# Patient Record
Sex: Female | Born: 1945 | Race: White | Hispanic: No | Marital: Single | Smoking: Current every day smoker
Health system: Southern US, Community
[De-identification: ages and names within clinical notes are randomized; demographics above are authoritative.]

## PROBLEM LIST (undated history)

## (undated) DIAGNOSIS — I2510 Atherosclerotic heart disease of native coronary artery without angina pectoris: Secondary | ICD-10-CM

## (undated) DIAGNOSIS — E785 Hyperlipidemia, unspecified: Secondary | ICD-10-CM

## (undated) DIAGNOSIS — I10 Essential (primary) hypertension: Secondary | ICD-10-CM

## (undated) DIAGNOSIS — I700 Atherosclerosis of aorta: Secondary | ICD-10-CM

## (undated) DIAGNOSIS — K219 Gastro-esophageal reflux disease without esophagitis: Secondary | ICD-10-CM

## (undated) DIAGNOSIS — I739 Peripheral vascular disease, unspecified: Secondary | ICD-10-CM

## (undated) DIAGNOSIS — I6529 Occlusion and stenosis of unspecified carotid artery: Secondary | ICD-10-CM

## (undated) DIAGNOSIS — M810 Age-related osteoporosis without current pathological fracture: Secondary | ICD-10-CM

## (undated) DIAGNOSIS — E119 Type 2 diabetes mellitus without complications: Secondary | ICD-10-CM

## (undated) DIAGNOSIS — F329 Major depressive disorder, single episode, unspecified: Secondary | ICD-10-CM

## (undated) DIAGNOSIS — F419 Anxiety disorder, unspecified: Secondary | ICD-10-CM

## (undated) HISTORY — DX: Major depressive disorder, single episode, unspecified: F32.9

## (undated) HISTORY — DX: Hyperlipidemia, unspecified: E78.5

## (undated) HISTORY — DX: Occlusion and stenosis of unspecified carotid artery: I65.29

## (undated) HISTORY — PX: ABDOMINAL HYSTERECTOMY: SHX81

## (undated) HISTORY — DX: Atherosclerosis of aorta: I70.0

## (undated) HISTORY — DX: Atherosclerotic heart disease of native coronary artery without angina pectoris: I25.10

## (undated) HISTORY — DX: Age-related osteoporosis without current pathological fracture: M81.0

## (undated) HISTORY — DX: Gastro-esophageal reflux disease without esophagitis: K21.9

## (undated) HISTORY — DX: Peripheral vascular disease, unspecified: I73.9

---

## 1998-04-25 ENCOUNTER — Inpatient Hospital Stay (HOSPITAL_COMMUNITY): Admission: EM | Admit: 1998-04-25 | Discharge: 1998-04-29 | Payer: Self-pay | Admitting: Emergency Medicine

## 2010-01-25 HISTORY — PX: KNEE SURGERY: SHX244

## 2012-12-15 ENCOUNTER — Emergency Department (HOSPITAL_COMMUNITY)
Admission: EM | Admit: 2012-12-15 | Discharge: 2012-12-15 | Disposition: A | Discharge status: Home / Self Care | Payer: BC Managed Care – PPO | Attending: Emergency Medicine | Admitting: Emergency Medicine

## 2012-12-15 ENCOUNTER — Encounter (HOSPITAL_COMMUNITY): Payer: Self-pay | Admitting: Emergency Medicine

## 2012-12-15 ENCOUNTER — Emergency Department (HOSPITAL_COMMUNITY): Payer: BC Managed Care – PPO

## 2012-12-15 DIAGNOSIS — F17200 Nicotine dependence, unspecified, uncomplicated: Secondary | ICD-10-CM | POA: Insufficient documentation

## 2012-12-15 DIAGNOSIS — Z794 Long term (current) use of insulin: Secondary | ICD-10-CM | POA: Insufficient documentation

## 2012-12-15 DIAGNOSIS — R079 Chest pain, unspecified: Secondary | ICD-10-CM

## 2012-12-15 DIAGNOSIS — Z880 Allergy status to penicillin: Secondary | ICD-10-CM | POA: Diagnosis not present

## 2012-12-15 DIAGNOSIS — I10 Essential (primary) hypertension: Secondary | ICD-10-CM | POA: Diagnosis not present

## 2012-12-15 DIAGNOSIS — F411 Generalized anxiety disorder: Secondary | ICD-10-CM | POA: Diagnosis not present

## 2012-12-15 DIAGNOSIS — E119 Type 2 diabetes mellitus without complications: Secondary | ICD-10-CM | POA: Insufficient documentation

## 2012-12-15 DIAGNOSIS — R0789 Other chest pain: Secondary | ICD-10-CM | POA: Insufficient documentation

## 2012-12-15 DIAGNOSIS — Z79899 Other long term (current) drug therapy: Secondary | ICD-10-CM | POA: Diagnosis not present

## 2012-12-15 DIAGNOSIS — R739 Hyperglycemia, unspecified: Secondary | ICD-10-CM

## 2012-12-15 HISTORY — DX: Essential (primary) hypertension: I10

## 2012-12-15 HISTORY — DX: Anxiety disorder, unspecified: F41.9

## 2012-12-15 HISTORY — DX: Type 2 diabetes mellitus without complications: E11.9

## 2012-12-15 LAB — CBC
Hemoglobin: 15.9 g/dL — ABNORMAL HIGH (ref 12.0–15.0)
MCH: 34.1 pg — ABNORMAL HIGH (ref 26.0–34.0)
MCHC: 36.1 g/dL — ABNORMAL HIGH (ref 30.0–36.0)
RBC: 4.66 MIL/uL (ref 3.87–5.11)
RDW: 11.9 % (ref 11.5–15.5)

## 2012-12-15 LAB — POCT I-STAT TROPONIN I: Troponin i, poc: 0.01 ng/mL (ref 0.00–0.08)

## 2012-12-15 LAB — BASIC METABOLIC PANEL
BUN: 12 mg/dL (ref 6–23)
Calcium: 9.7 mg/dL (ref 8.4–10.5)
GFR calc Af Amer: >90 mL/min (ref >90)
GFR calc non Af Amer: 87 mL/min — ABNORMAL LOW (ref >90)
Glucose, Bld: 460 mg/dL — ABNORMAL HIGH (ref 70–99)
Potassium: 4.2 mEq/L (ref 3.5–5.1)
Sodium: 131 mEq/L — ABNORMAL LOW (ref 135–145)

## 2012-12-15 MED ORDER — NITROGLYCERIN 0.4 MG SL SUBL
0.4000 mg | SUBLINGUAL_TABLET | SUBLINGUAL | Status: DC | PRN
  Administered 2012-12-15 (×2): 0.4 mg via SUBLINGUAL
  Filled 2012-12-15: qty 25

## 2012-12-15 MED ORDER — INSULIN ASPART 100 UNIT/ML ~~LOC~~ SOLN: 10.0000 [IU] | Freq: Once | SUBCUTANEOUS | Status: DC

## 2012-12-15 MED ORDER — SODIUM CHLORIDE 0.9 % IV BOLUS (SEPSIS)
1000.0000 mL | Freq: Once | INTRAVENOUS | Status: AC
  Administered 2012-12-15: 1000 mL via INTRAVENOUS

## 2012-12-15 MED ORDER — INSULIN ASPART 100 UNIT/ML ~~LOC~~ SOLN
5.0000 [IU] | Freq: Once | SUBCUTANEOUS | Status: AC
  Administered 2012-12-15: 5 [IU] via INTRAVENOUS
  Filled 2012-12-15 (×2): qty 1

## 2012-12-15 MED ORDER — LIDOCAINE VISCOUS 2 % MT SOLN
15.0000 mL | Freq: Once | OROMUCOSAL | Status: AC
  Administered 2012-12-15: 15 mL via OROMUCOSAL
  Filled 2012-12-15: qty 15

## 2012-12-15 MED ORDER — ASPIRIN 325 MG PO TABS
325.0000 mg | ORAL_TABLET | Freq: Once | ORAL | Status: AC
  Administered 2012-12-15: 325 mg via ORAL
  Filled 2012-12-15: qty 1

## 2012-12-15 MED ORDER — ALUM & MAG HYDROXIDE-SIMETH 200-200-20 MG/5ML PO SUSP
30.0000 mL | Freq: Once | ORAL | Status: AC
  Administered 2012-12-15: 30 mL via ORAL
  Filled 2012-12-15: qty 30

## 2012-12-15 NOTE — ED Notes (Signed)
Per pt sts since Wednesday pain in the middle of her chest and her back. sts some nausea. sts recent death in the family anf very stressed. sts hx of anxiety and sts she hopes it is that. sts some SOB when the pain is there and it feels better when she lies down and rest.

## 2012-12-15 NOTE — ED Provider Notes (Signed)
Patient seen/examined in the Emergency Department in conjunction with Resident Physician Provider Thuet Patient reports chest pain, worse with swallowing/eating Exam : awake/alert, no distress,maex4, no murmurs noted Plan: suspect GI cause of CP.  She reports she will be able to f/u next week with PCP, I doubt ACS/PE/Dissection at this time    Joya Gaskins, MD 12/15/12 [phone]

## 2012-12-15 NOTE — ED Notes (Signed)
EKG completed given to EDP.  

## 2012-12-15 NOTE — ED Provider Notes (Signed)
Date: 12/15/2012  Rate: 124  Rhythm: sinus tachycardia  QRS Axis: normal  Intervals: normal  ST/T Wave abnormalities: nonspecific ST changes  Conduction Disutrbances:none  Narrative Interpretation:   Old EKG Reviewed: none available    Joya Gaskins, MD 12/15/12 [phone]

## 2012-12-15 NOTE — ED Provider Notes (Signed)
CSN: [id number]     Arrival date & time 12/15/12  1143 History   First MD Initiated Contact with Patient 12/15/12 1512     Chief Complaint  Patient presents with  . Chest Pain  . Anxiety   (Consider location/radiation/quality/duration/timing/severity/associated sxs/prior Treatment) HPI Sarah Francis is a 67 y.o. female who presents to the emergency department with chest pain and anxiety.  Patient reports that over the last month she has felt a pain in her central chest.  Constant.  Moderate in severity. Worse with swallowing.  Better with nothing.  Nonexertional.  Associated with foul taste in mouth.  No SOB.  Pain not worse with deep breathing or coughing.  No cough.  Not described as ripping or tearing.  No leg swelling.  No other symptoms.  Past Medical History  Diagnosis Date  . Anxiety   . Diabetes mellitus without complication   . Hypertension    Past Surgical History  Procedure Laterality Date  . Abdominal hysterectomy     History reviewed. No pertinent family history. History  Substance Use Topics  . Smoking status: Current Every Day Smoker  . Smokeless tobacco: Not on file  . Alcohol Use: Yes   OB History   Grav Para Term Preterm Abortions TAB SAB Ect Mult Living                 Review of Systems  Constitutional: Negative for fever and chills.  HENT: Negative for congestion and rhinorrhea.   Respiratory: Negative for cough and shortness of breath.   Cardiovascular: Positive for chest pain.  Gastrointestinal: Negative for nausea, vomiting, abdominal pain, diarrhea and abdominal distention.  Endocrine: Negative for polyuria.  Genitourinary: Negative for dysuria.  Musculoskeletal: Negative for neck pain and neck stiffness.  Skin: Negative for rash.  Neurological: Negative for headaches.  Psychiatric/Behavioral: Negative.   All other systems reviewed and are negative.    Allergies  Penicillins  Home Medications   Current Outpatient Rx  Name  Route  Sig   Dispense  Refill  . DULoxetine (CYMBALTA) 60 MG capsule   Oral   Take 60 mg by mouth daily.         . insulin aspart (NOVOLOG FLEXPEN) 100 UNIT/ML SOPN FlexPen   Subcutaneous   Inject 8-12 Units into the skin 3 (three) times daily with meals. Take 8 units in the morning 10 units before lunch and 12 units before dinner         . Insulin Glargine (LANTUS SOLOSTAR) 100 UNIT/ML SOPN   Subcutaneous   Inject 32 Units into the skin at bedtime.         Marland Kitchen lisinopril (PRINIVIL,ZESTRIL) 2.5 MG tablet   Oral   Take 2.5 mg by mouth daily.         . Pseudoephedrine-Ibuprofen (ADVIL COLD & SINUS LIQUI-GELS PO)   Oral   Take 1 capsule by mouth 2 (two) times daily as needed (for cold).         . ranitidine (ZANTAC) 150 MG tablet   Oral   Take 150 mg by mouth 2 (two) times daily as needed for heartburn.         . rosuvastatin (CRESTOR) 40 MG tablet   Oral   Take 10 mg by mouth 3 (three) times a week.         . Vitamin D, Ergocalciferol, (DRISDOL) 50000 UNITS CAPS capsule   Oral   Take 50,000 Units by mouth every 7 (seven) days.  BP 180/95  Pulse 111  Temp(Src) 98.1 F (36.7 C)  Resp 18  Ht 5\' 1"  (1.549 m)  Wt 140 lb (63.504 kg)  BMI 26.47 kg/m2  SpO2 97% Physical Exam  Nursing note and vitals reviewed. Constitutional: She is oriented to person, place, and time. She appears well-developed and well-nourished. No distress.  HENT:  Head: Normocephalic and atraumatic.  Right Ear: External ear normal.  Left Ear: External ear normal.  Nose: Nose normal.  Mouth/Throat: Oropharynx is clear and moist. No oropharyngeal exudate.  Eyes: EOM are normal. Pupils are equal, round, and reactive to light.  Neck: Normal range of motion. Neck supple. No tracheal deviation present.  Cardiovascular: Normal rate.   Pulmonary/Chest: Effort normal and breath sounds normal. No stridor. No respiratory distress. She has no wheezes. She has no rales.  Abdominal: Soft. She exhibits no  distension. There is no tenderness. There is no rebound.  Musculoskeletal: Normal range of motion.  Neurological: She is alert and oriented to person, place, and time.  Skin: Skin is warm and dry. She is not diaphoretic.    ED Course  Procedures (including critical care time) Labs Review Labs Reviewed  CBC - Abnormal; Notable for the following:    Hemoglobin 15.9 (*)    MCH 34.1 (*)    MCHC 36.1 (*)    All other components within normal limits  BASIC METABOLIC PANEL - Abnormal; Notable for the following:    Sodium 131 (*)    Chloride 92 (*)    Glucose, Bld 460 (*)    GFR calc non Af Amer 87 (*)    All other components within normal limits  GLUCOSE, CAPILLARY - Abnormal; Notable for the following:    Glucose-Capillary 201 (*)    All other components within normal limits  POCT I-STAT TROPONIN I  POCT I-STAT TROPONIN I   Imaging Review Dg Chest 2 View  12/15/2012   CLINICAL DATA:  Chest pain.  EXAM: CHEST  2 VIEW  COMPARISON:  None.  FINDINGS: Mediastinum and hilar structures are normal. Mild atelectasis versus infiltrate present in the lingula. No pleural effusion or pneumothorax. Heart size normal. Pulmonary vascularity normal. Prominent rounded calcific density right upper quadrant most likely a large gallstone. Ultrasound can be obtained for further evaluation is clinically indicated. No acute osseous abnormality.  IMPRESSION: 1. Mild lingular atelectasis and/or infiltrate. 2. Large calcific density right upper quadrant, most likely gallstone. Ultrasound can be obtained for further evaluation as clinically indicated.   Electronically Signed   By: Maisie Fus  Register   On: 12/15/2012 13:07    EKG Interpretation   None       MDM   1. Chest pain   2. Hyperglycemia      Sarah Francis is a 67 y.o. female who presents to the emergency with atypical chest pain.  Sounds to be very GI in nature.  However, patient has history of HTN, HLD, and DM.  Because of this she is not low risk.   Discussed case with patient regarding admission for formal cardiac rule out and discharge home with outpatient f/u and stress test.  Patient reports that she desires discharge and will f/u with her PCP in less than 72 hours.  Patient knows that she is always welcome back for further workup here if she changes her mind or if she develops new or worsening symptoms.  Pain very atypical description for PE, dissection, or occult ptx.  Doubt pneumonia.  As for hyperglycemia, patient reports that  she had not been able to take her insulin today.  No DKA on labs.  5 IV insulin and 1L NS administered with resolution of hyperglycemia to 201.  Patient safe for discharge home and is chest pain free after GI cocktail.  Patient discharged.  Recommend f/u with PCP.    Arloa Koh, MD 12/15/12 [phone]

## 2012-12-17 NOTE — ED Provider Notes (Signed)
I have personally seen and examined the patient.  I have discussed the plan of care with the resident.  I have reviewed the documentation on PMH/FH/Soc. History.  I have reviewed the documentation of the resident and agree.   Joya Gaskins, MD 12/17/12 [phone]

## 2012-12-22 ENCOUNTER — Encounter: Payer: Self-pay | Admitting: General Surgery

## 2012-12-22 DIAGNOSIS — M810 Age-related osteoporosis without current pathological fracture: Secondary | ICD-10-CM

## 2012-12-22 DIAGNOSIS — G479 Sleep disorder, unspecified: Secondary | ICD-10-CM

## 2012-12-22 DIAGNOSIS — E1149 Type 2 diabetes mellitus with other diabetic neurological complication: Secondary | ICD-10-CM

## 2012-12-22 DIAGNOSIS — E559 Vitamin D deficiency, unspecified: Secondary | ICD-10-CM

## 2012-12-22 DIAGNOSIS — F1020 Alcohol dependence, uncomplicated: Secondary | ICD-10-CM

## 2012-12-22 DIAGNOSIS — E782 Mixed hyperlipidemia: Secondary | ICD-10-CM

## 2012-12-22 DIAGNOSIS — F17200 Nicotine dependence, unspecified, uncomplicated: Secondary | ICD-10-CM

## 2012-12-22 DIAGNOSIS — G2581 Restless legs syndrome: Secondary | ICD-10-CM

## 2012-12-22 DIAGNOSIS — E1142 Type 2 diabetes mellitus with diabetic polyneuropathy: Secondary | ICD-10-CM

## 2012-12-22 DIAGNOSIS — F419 Anxiety disorder, unspecified: Secondary | ICD-10-CM

## 2012-12-22 DIAGNOSIS — M722 Plantar fascial fibromatosis: Secondary | ICD-10-CM

## 2012-12-25 ENCOUNTER — Ambulatory Visit (INDEPENDENT_AMBULATORY_CARE_PROVIDER_SITE_OTHER): Payer: BC Managed Care – PPO | Admitting: Cardiology

## 2012-12-25 ENCOUNTER — Encounter: Payer: Self-pay | Admitting: General Surgery

## 2012-12-25 ENCOUNTER — Encounter: Payer: Self-pay | Admitting: Cardiology

## 2012-12-25 VITALS — BP 152/86 | HR 92 | Ht 61.0 in | Wt 139.0 lb

## 2012-12-25 DIAGNOSIS — I10 Essential (primary) hypertension: Secondary | ICD-10-CM | POA: Insufficient documentation

## 2012-12-25 DIAGNOSIS — R079 Chest pain, unspecified: Secondary | ICD-10-CM | POA: Insufficient documentation

## 2012-12-25 NOTE — Patient Instructions (Signed)
Your physician recommends that you continue on your current medications as directed. Please refer to the Current Medication list given to you today.  Your physician has requested that you have an exercise stress myoview. For further information please visit [email or web address] Please follow instruction sheet, as given.  Your physician recommends that you schedule a follow-up appointment in: PRN if Symptoms worsen or fail to improve

## 2012-12-25 NOTE — Progress Notes (Signed)
[address] [place], [place]  [address] Phone: [phone] Fax:  [phone]  Date:  12/25/2012   ID:  Sarah Francis, DOB 12-25-1945, MRN [id number]  PCP:  Gweneth Dimitri, MD  Cardiologist:  Armanda Magic, MD    History of Present Illness: Sarah Francis is a 67 y.o. female with a history of DM who awakened with Chest pain on Friday.  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  She saw the nurse at her work and her BP was 190/135mmHg.  Her BS was 500.  She was sent to the ER and cardiac eval in ER was fine and she did not want to be admitted and was sent home with referral to Cardiology.  Since then she has had intermittent CP daily but is situational and occurs more when she is overwhelmed or stressed.  When she goes to eat dinner it hurts when she swallows food in the same midsternal area of her chest.  She is very anxious.  She denies any SOB.  She denies any diaphoresis or nausea with the CP.  Belching relieves the pain and zantac also helps.  She freqently wakes up in the middle of the night with a sour taste in her mouth and chest pressure and if she drinks water and belches it resolves.   Wt Readings from Last 3 Encounters:  12/25/12 139 lb (63.05 kg)  12/22/12 138 lb 12.8 oz (62.959 kg)  12/15/12 140 lb (63.504 kg)     Past Medical History  Diagnosis Date  . Anxiety   . Diabetes mellitus without complication   . Hypertension   . Major depression     initial diagnosis/medication around age 50  . Osteoporosis     w h/o sacral fracture    Current Outpatient Prescriptions  Medication Sig Dispense Refill  . Calcium Carbonate-Vit D-Min (CALCIUM 1200 PO) Take 1 tablet by mouth daily.      . DULoxetine (CYMBALTA) 60 MG capsule Take 60 mg by mouth daily.      . insulin aspart (NOVOLOG FLEXPEN) 100 UNIT/ML SOPN FlexPen Inject 8-12 Units into the skin 3 (three) times daily with meals. Take 8  units in the morning 10 units before lunch and 12 units before dinner      . Insulin Glargine (LANTUS SOLOSTAR) 100 UNIT/ML SOPN Inject 32 Units into the skin at bedtime.      Marland Kitchen lisinopril (PRINIVIL,ZESTRIL) 2.5 MG tablet Take 2.5 mg by mouth daily.      . Magnesium 250 MG TABS Take 1 tablet by mouth. PCP recommends taking 250-500 Mg 2 hours prior to bedtime is can help with restless leg syndrome as well as improve sleep.      . Melatonin 3 MG TABS Take 1 tablet by mouth. PCP recommends taking 3-6 MG about 2 hrs before bedtime      . Pseudoephedrine-Ibuprofen (ADVIL COLD & SINUS LIQUI-GELS PO) Take 1 capsule by mouth 2 (two) times daily as needed (for cold).      . ranitidine (ZANTAC) 150 MG tablet Take 150 mg by mouth 2 (two) times daily as needed for heartburn.      . rosuvastatin (CRESTOR) 40 MG tablet Take 10 mg by mouth 3 (three) times a week.      . Vitamin D, Ergocalciferol, (DRISDOL) 50000 UNITS CAPS capsule Take 50,000 Units by mouth every 7 (seven) days.  No current facility-administered medications for this visit.    Allergies:    Allergies  Allergen Reactions  . Penicillins Rash    Social History:  The patient  reports that she has been smoking.  She has never used smokeless tobacco. She reports that she drinks alcohol. She reports that she does not use illicit drugs.   Family History:  The patient's family history is not on file.   ROS:  Please see the history of present illness.      All other systems reviewed and negative.   PHYSICAL EXAM: VS:  BP 152/86  Pulse 92  Ht 5\' 1"  (1.549 m)  Wt 139 lb (63.05 kg)  BMI 26.28 kg/m2 Well nourished, well developed, in no acute distress HEENT: normal Neck: no JVD Cardiac:  normal S1, S2; RRR; no murmur Lungs:  clear to auscultation bilaterally, no wheezing, rhonchi or rales Abd: soft, nontender, no hepatomegaly Ext: no edema Skin: warm and dry Neuro:  CNs 2-12 intact, no focal abnormalities noted  EKG:  NSR with  PAC's  ASSESSMENT AND PLAN:  1. Chest pain which sounds more GI in nature but with her cardiac risk factors including post-menopausal state, DM, dyslipidemia, HTN, ongoing tobacco use and family history of CAD, need to consider coronary ischemia.  - will get an Exercise Myoview to rule out ischemia 2. HTN - mildly elevated today  - continue Lisinopril  - will see what BP is at time of stress test and adjust medications as needed   Signed, Armanda Magic, MD 12/25/2012 11:36 AM

## 2013-01-12 ENCOUNTER — Encounter (HOSPITAL_COMMUNITY): Payer: BC Managed Care – PPO

## 2013-01-15 ENCOUNTER — Ambulatory Visit (HOSPITAL_COMMUNITY): Payer: BC Managed Care – PPO | Attending: Cardiology | Admitting: Radiology

## 2013-01-15 ENCOUNTER — Encounter: Payer: Self-pay | Admitting: Cardiology

## 2013-01-15 VITALS — BP 141/83 | HR 106 | Ht 62.0 in | Wt 136.0 lb

## 2013-01-15 DIAGNOSIS — F17200 Nicotine dependence, unspecified, uncomplicated: Secondary | ICD-10-CM | POA: Insufficient documentation

## 2013-01-15 DIAGNOSIS — Z8249 Family history of ischemic heart disease and other diseases of the circulatory system: Secondary | ICD-10-CM | POA: Insufficient documentation

## 2013-01-15 DIAGNOSIS — Z794 Long term (current) use of insulin: Secondary | ICD-10-CM | POA: Insufficient documentation

## 2013-01-15 DIAGNOSIS — I10 Essential (primary) hypertension: Secondary | ICD-10-CM | POA: Insufficient documentation

## 2013-01-15 DIAGNOSIS — R079 Chest pain, unspecified: Secondary | ICD-10-CM

## 2013-01-15 DIAGNOSIS — E119 Type 2 diabetes mellitus without complications: Secondary | ICD-10-CM | POA: Insufficient documentation

## 2013-01-15 MED ORDER — TECHNETIUM TC 99M SESTAMIBI GENERIC - CARDIOLITE
33.0000 | Freq: Once | INTRAVENOUS | Status: AC | PRN
  Administered 2013-01-15: 33 via INTRAVENOUS

## 2013-01-15 MED ORDER — TECHNETIUM TC 99M SESTAMIBI GENERIC - CARDIOLITE
11.0000 | Freq: Once | INTRAVENOUS | Status: AC | PRN
  Administered 2013-01-15: 11 via INTRAVENOUS

## 2013-01-15 NOTE — Progress Notes (Signed)
  [facility] SITE 3 NUCLEAR MED [address] [place], [place] [address] [phone]    Cardiology Nuclear Med Study  Sarah Francis is a 67 y.o. female     MRN : [id number]     DOB: [birth year]  Procedure Date: 01/15/2013  Nuclear Med Background Indication for Stress Test:  Evaluation for Ischemia History:  No known CAD Cardiac Risk Factors: Family History - CAD, Hypertension, IDDM Type 1, Lipids and Smoker  Symptoms:  Chest Pain   Nuclear Pre-Procedure Caffeine/Decaff Intake:  None NPO After: 11:00pm   Lungs:  clear O2 Sat: 98% on room air. IV 0.9% NS with Angio Cath:  22g  IV Site: R Wrist  IV Started by:  Cathlyn Parsons, RN  Chest Size (in):  36 Cup Size: D  Height: 5\' 2"  (1.575 m)  Weight:  136 lb (61.689 kg)  BMI:  Body mass index is 24.87 kg/(m^2). Tech Comments:  CBG 185;Novolog insulin 8 u taken at 0930.     Nuclear Med Study 1 or 2 day study: 1 day  Stress Test Type:  Stress  Reading MD: Armanda Magic, MD  Order Authorizing Provider:  Gevena Cotton  Resting Radionuclide: Technetium 33m Sestamibi  Resting Radionuclide Dose: 11.0 mCi   Stress Radionuclide:  Technetium 19m Sestamibi  Stress Radionuclide Dose: 33.0 mCi           Stress Protocol Rest HR: 106 Stress HR: 155  Rest BP: 141/83 Stress BP: 205/82  Exercise Time (min): 4:00 METS: 4.6           Dose of Adenosine (mg):  n/a Dose of Lexiscan: n/a mg  Dose of Atropine (mg): n/a Dose of Dobutamine: n/a mcg/kg/min (at max HR)  Stress Test Technologist: Nelson Chimes, BS-ES  Nuclear Technologist:  Harlow Asa, CNMT     Rest Procedure:  Myocardial perfusion imaging was performed at rest 45 minutes following the intravenous administration of Technetium 65m Sestamibi. Rest ECG: NSR - Normal EKG  Stress Procedure:  The patient exercised on the treadmill utilizing the Bruce Protocol for 4:00 minutes. The patient stopped due to foot and buttocks pain and denied any chest pain.  Technetium  28m Sestamibi was injected at peak exercise and myocardial perfusion imaging was performed after a brief delay. Stress ECG: No significant change from baseline ECG  QPS Raw Data Images:  Normal; no motion artifact; normal heart/lung ratio. Stress Images:  Normal homogeneous uptake in all areas of the myocardium. Rest Images:  Normal homogeneous uptake in all areas of the myocardium. Subtraction (SDS):  Normal Transient Ischemic Dilatation (Normal <1.22):  0.98 Lung/Heart Ratio (Normal <0.45):  0.32  Quantitative Gated Spect Images QGS EDV:  39 ml QGS ESV:  6 ml  Impression Exercise Capacity:  Fair exercise capacity. BP Response:  Hypertensive blood pressure response. Clinical Symptoms:  No significant symptoms noted. ECG Impression:  No significant ST segment change suggestive of ischemia. Comparison with Prior Nuclear Study: No previous nuclear study performed  Overall Impression:  Normal stress nuclear study.  LV Ejection Fraction: 84%.  LV Wall Motion:  NL LV Function; NL Wall Motion   Signed: Armanda Magic, MD 01/16/2013

## 2013-11-02 ENCOUNTER — Other Ambulatory Visit: Payer: Self-pay | Admitting: Family Medicine

## 2013-11-02 ENCOUNTER — Ambulatory Visit
Admission: RE | Admit: 2013-11-02 | Discharge: 2013-11-02 | Disposition: A | Discharge status: Home / Self Care | Payer: 59 | Source: Ambulatory Visit | Attending: Family Medicine | Admitting: Family Medicine

## 2013-11-02 DIAGNOSIS — R079 Chest pain, unspecified: Secondary | ICD-10-CM

## 2015-10-23 ENCOUNTER — Other Ambulatory Visit: Payer: Self-pay | Admitting: Family Medicine

## 2015-10-23 DIAGNOSIS — Z1231 Encounter for screening mammogram for malignant neoplasm of breast: Secondary | ICD-10-CM

## 2016-01-02 ENCOUNTER — Ambulatory Visit: Payer: Self-pay

## 2016-01-10 ENCOUNTER — Emergency Department (HOSPITAL_BASED_OUTPATIENT_CLINIC_OR_DEPARTMENT_OTHER)
Admission: EM | Admit: 2016-01-10 | Discharge: 2016-01-10 | Disposition: A | Discharge status: Home / Self Care | Payer: Medicare Other | Attending: Emergency Medicine | Admitting: Emergency Medicine

## 2016-01-10 ENCOUNTER — Emergency Department (HOSPITAL_BASED_OUTPATIENT_CLINIC_OR_DEPARTMENT_OTHER): Payer: Medicare Other

## 2016-01-10 ENCOUNTER — Encounter (HOSPITAL_BASED_OUTPATIENT_CLINIC_OR_DEPARTMENT_OTHER): Payer: Self-pay | Admitting: Emergency Medicine

## 2016-01-10 DIAGNOSIS — T148XXA Other injury of unspecified body region, initial encounter: Secondary | ICD-10-CM

## 2016-01-10 DIAGNOSIS — S59902A Unspecified injury of left elbow, initial encounter: Secondary | ICD-10-CM | POA: Diagnosis present

## 2016-01-10 DIAGNOSIS — Y939 Activity, unspecified: Secondary | ICD-10-CM | POA: Diagnosis not present

## 2016-01-10 DIAGNOSIS — Y929 Unspecified place or not applicable: Secondary | ICD-10-CM | POA: Insufficient documentation

## 2016-01-10 DIAGNOSIS — W19XXXA Unspecified fall, initial encounter: Secondary | ICD-10-CM

## 2016-01-10 DIAGNOSIS — I10 Essential (primary) hypertension: Secondary | ICD-10-CM | POA: Diagnosis not present

## 2016-01-10 DIAGNOSIS — F17200 Nicotine dependence, unspecified, uncomplicated: Secondary | ICD-10-CM | POA: Diagnosis not present

## 2016-01-10 DIAGNOSIS — W109XXA Fall (on) (from) unspecified stairs and steps, initial encounter: Secondary | ICD-10-CM | POA: Diagnosis not present

## 2016-01-10 DIAGNOSIS — Y999 Unspecified external cause status: Secondary | ICD-10-CM | POA: Insufficient documentation

## 2016-01-10 DIAGNOSIS — S0990XA Unspecified injury of head, initial encounter: Secondary | ICD-10-CM | POA: Insufficient documentation

## 2016-01-10 DIAGNOSIS — E119 Type 2 diabetes mellitus without complications: Secondary | ICD-10-CM | POA: Diagnosis not present

## 2016-01-10 DIAGNOSIS — Z794 Long term (current) use of insulin: Secondary | ICD-10-CM | POA: Insufficient documentation

## 2016-01-10 DIAGNOSIS — S80812A Abrasion, left lower leg, initial encounter: Secondary | ICD-10-CM | POA: Insufficient documentation

## 2016-01-10 DIAGNOSIS — Z79899 Other long term (current) drug therapy: Secondary | ICD-10-CM | POA: Insufficient documentation

## 2016-01-10 DIAGNOSIS — M25552 Pain in left hip: Secondary | ICD-10-CM

## 2016-01-10 DIAGNOSIS — S51012A Laceration without foreign body of left elbow, initial encounter: Secondary | ICD-10-CM | POA: Diagnosis not present

## 2016-01-10 MED ORDER — HYDROCODONE-ACETAMINOPHEN 5-325 MG PO TABS
1.0000 | ORAL_TABLET | Freq: Once | ORAL | Status: AC
  Administered 2016-01-10: 1 via ORAL
  Filled 2016-01-10: qty 1

## 2016-01-10 MED ORDER — HYDROCODONE-ACETAMINOPHEN 5-325 MG PO TABS: 1.0000 | ORAL_TABLET | Freq: Four times a day (QID) | ORAL | 0 refills | Status: DC | PRN

## 2016-01-10 NOTE — ED Provider Notes (Signed)
[facility] Provider Note   CSN: [id number] Arrival date & time: 01/10/16  1404   By signing my name below, I, Nelwyn SalisburyJoshua Fowler, attest that this documentation has been prepared under the direction and in the presence of Vanetta MuldersScott Messiah Ahr, MD . Electronically Signed: Nelwyn SalisburyJoshua Fowler, Scribe. 01/10/2016. 3:11 PM.  History   Chief Complaint Chief Complaint  Patient presents with  . Fall   The history is provided by the patient. No language interpreter was used.    HPI Comments:  Sarah Francis is a 70 y.o. female with pmhx of GERD, HTN, and Osteoporosis who presents to the Emergency Department complaining of constant unchanged left hip pain s/p fall occurring about 21 hours ago. When asked to point to her pain, she indicates that her pain is located around her left upper buttock and radiates down into her groin. Pt states that she was carrying something heavy when she fell forward down two steps and landed on her left side. She reports hitting her head during the fall.  Pt reports associated left shin pain, left arm pain, left shoulder pain, left elbow pain, nausea, vomiting. She denies any trouble breathing, abdominal pain, chills, fever, congestion, rhinorrhea, sore throat, visual disturbance, cough, chest pain, abdominal pain, diarrhea, dysuria, hematuria, back pain, joint swelling, headaches, syncope, bleeding problems, or confusion.    Past Medical History:  Diagnosis Date  . Anxiety   . Diabetes mellitus without complication (HCC)   . GERD (gastroesophageal reflux disease)   . Hypertension   . Major depression    initial diagnosis/medication around age 89  . Osteoporosis    w h/o sacral fracture    Patient Active Problem List   Diagnosis Date Noted  . Chest pain 12/25/2012  . Hypertension   . 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  . Polyneuropathy in diabetes(357.2) 12/22/2012  . Plantar fasciitis, bilateral 12/22/2012  . Mixed hyperlipidemia  12/22/2012  . Osteoporosis, unspecified 12/22/2012  . Vitamin D deficiency 12/22/2012  . Restless legs syndrome (RLS) 12/22/2012  . Anxiety 12/22/2012  . Sleep disturbance 12/22/2012  . Tobacco use disorder 12/22/2012  . Alcohol dependence (HCC) 12/22/2012    Past Surgical History:  Procedure Laterality Date  . ABDOMINAL HYSTERECTOMY    . KNEE SURGERY Right 2012    OB History    No data available       Home Medications    Prior to Admission medications   Medication Sig Start Date End Date Taking? Authorizing Provider  atorvastatin (LIPITOR) 10 MG tablet Take 10 mg by mouth daily.   Yes Historical Provider, MD  DULoxetine (CYMBALTA) 60 MG capsule Take 60 mg by mouth daily.   Yes Historical Provider, MD  insulin aspart (NOVOLOG FLEXPEN) 100 UNIT/ML SOPN FlexPen Inject 8-12 Units into the skin 3 (three) times daily with meals. Take 8 units in the morning 10 units before lunch and 12 units before dinner   Yes Historical Provider, MD  Insulin Glargine (LANTUS SOLOSTAR) 100 UNIT/ML SOPN Inject 32 Units into the skin at bedtime.   Yes Historical Provider, MD  lisinopril (PRINIVIL,ZESTRIL) 2.5 MG tablet Take 2.5 mg by mouth daily.   Yes Historical Provider, MD  Magnesium 250 MG TABS Take 1 tablet by mouth. PCP recommends taking 250-500 Mg 2 hours prior to bedtime is can help with restless leg syndrome as well as improve sleep.   Yes Historical Provider, MD  Melatonin 3 MG TABS Take 1 tablet by mouth. PCP recommends taking 3-6 MG about 2 hrs  before bedtime   Yes Historical Provider, MD  ranitidine (ZANTAC) 150 MG tablet Take 150 mg by mouth 2 (two) times daily as needed for heartburn.   Yes Historical Provider, MD  Calcium Carbonate-Vit D-Min (CALCIUM 1200 PO) Take 1 tablet by mouth daily.    Historical Provider, MD  HYDROcodone-acetaminophen (NORCO/VICODIN) 5-325 MG tablet Take 1-2 tablets by mouth every 6 (six) hours as needed for moderate pain. 01/10/16   Vanetta Mulders, MD    Pseudoephedrine-Ibuprofen (ADVIL COLD & SINUS LIQUI-GELS PO) Take 1 capsule by mouth 2 (two) times daily as needed (for cold).    Historical Provider, MD  rosuvastatin (CRESTOR) 40 MG tablet Take 10 mg by mouth 3 (three) times a week.    Historical Provider, MD  Vitamin D, Ergocalciferol, (DRISDOL) 50000 UNITS CAPS capsule Take 50,000 Units by mouth every 7 (seven) days.    Historical Provider, MD    Family History Family History  Problem Relation Age of Onset  . Heart attack Mother   . Heart disease Mother   . AAA (abdominal aortic aneurysm) Father   . Cancer Brother   . Hypertension Sister   . Diabetes Brother     Social History Social History  Substance Use Topics  . Smoking status: Current Every Day Smoker    Packs/day: 0.50  . Smokeless tobacco: Never Used  . Alcohol use Yes     Comment: 1-2 glasses of Wine per day     Allergies   Penicillins   Review of Systems Review of Systems  Constitutional: Negative for chills and fever.  HENT: Negative for congestion, rhinorrhea and sore throat.   Eyes: Negative for visual disturbance.  Respiratory: Negative for cough and shortness of breath.   Cardiovascular: Negative for chest pain.  Gastrointestinal: Positive for nausea and vomiting. Negative for abdominal pain and diarrhea.  Genitourinary: Negative for dysuria and hematuria.  Musculoskeletal: Positive for neck pain. Negative for back pain and joint swelling.  Skin: Positive for wound. Negative for rash.  Neurological: Negative for syncope and headaches.  Hematological: Does not bruise/bleed easily.  Psychiatric/Behavioral: Negative for confusion.     Physical Exam Updated Vital Signs BP 164/84 (BP Location: Right Arm)   Pulse 89   Temp 98.5 F (36.9 C) (Oral)   Resp 20   Ht 5\' 1"  (1.549 m)   Wt 65.8 kg   SpO2 100%   BMI 27.40 kg/m   Physical Exam  Constitutional: She is oriented to person, place, and time. She appears well-developed and well-nourished.  No distress.  HENT:  Head: Normocephalic and atraumatic.  Mouth/Throat: Oropharynx is clear and moist.  Eyes: Conjunctivae and EOM are normal. Pupils are equal, round, and reactive to light. No scleral icterus.  Neck:  Pain with ROM.   Cardiovascular: Normal rate, regular rhythm and normal heart sounds.   Pulses:      Radial pulses are 2+ on the left side.  Pulmonary/Chest: Effort normal and breath sounds normal.  Abdominal: Soft. Bowel sounds are normal. She exhibits no distension. There is no tenderness.  Musculoskeletal: Normal range of motion.  Neurological: She is alert and oriented to person, place, and time. No cranial nerve deficit or sensory deficit. She exhibits normal muscle tone. Coordination normal.  Skin: Skin is warm and dry.  Abrasion to left anterior shin measuring 2cm, no active bleeding. Old scar on right knee. Skin tear abrasion on lateral aspect of left elbow measuring 3cm by 2cm.   Psychiatric: She has a normal mood and  affect.  Nursing note and vitals reviewed.    ED Treatments / Results  DIAGNOSTIC STUDIES:  Oxygen Saturation is 99% on RA, normal by my interpretation.    COORDINATION OF CARE:  3:11 PM Discussed treatment plan with pt at bedside which includes blood work and imaging and pt agreed to plan.  Labs (all labs ordered are listed, but only abnormal results are displayed) Labs Reviewed - No data to display  EKG  EKG Interpretation None       Radiology Dg Elbow Complete Left  Result Date: 01/10/2016 CLINICAL DATA:  Left elbow pain after falling down 2 steps and landing on her left side. EXAM: LEFT ELBOW - COMPLETE 3+ VIEW COMPARISON:  None. FINDINGS: There is no evidence of fracture, dislocation, or joint effusion. There is no evidence of arthropathy or other focal bone abnormality. Soft tissues are unremarkable. IMPRESSION: Normal examination. Electronically Signed   By: Beckie Salts M.D.   On: 01/10/2016 18:57   Ct Head Wo  Contrast  Result Date: 01/10/2016 CLINICAL DATA:  Fall last night.  Neck pain. EXAM: CT HEAD WITHOUT CONTRAST CT CERVICAL SPINE WITHOUT CONTRAST TECHNIQUE: Multidetector CT imaging of the head and cervical spine was performed following the standard protocol without intravenous contrast. Multiplanar CT image reconstructions of the cervical spine were also generated. COMPARISON:  None. FINDINGS: CT HEAD FINDINGS Brain: No acute intracranial abnormality. Specifically, no hemorrhage, hydrocephalus, mass lesion, acute infarction, or significant intracranial injury. Vascular: No hyperdense vessel or unexpected calcification. Skull: No acute calvarial abnormality. Sinuses/Orbits: Visualized paranasal sinuses and mastoids clear. Orbital soft tissues unremarkable. Other: None CT CERVICAL SPINE FINDINGS Alignment: Normal Skull base and vertebrae: No acute fracture. No primary bone lesion or focal pathologic process. Soft tissues and spinal canal: No prevertebral fluid or swelling. No visible canal hematoma. Disc levels: Disc space narrowing and spurring from C4-5 thru C7-T1. Degenerative facet disease bilaterally, left greater than right. Upper chest: No acute findings. Other: None IMPRESSION: No acute intracranial abnormality. No acute bony abnormality in the cervical spine. Electronically Signed   By: Charlett Nose M.D.   On: 01/10/2016 16:25   Ct Cervical Spine Wo Contrast  Result Date: 01/10/2016 CLINICAL DATA:  Fall last night.  Neck pain. EXAM: CT HEAD WITHOUT CONTRAST CT CERVICAL SPINE WITHOUT CONTRAST TECHNIQUE: Multidetector CT imaging of the head and cervical spine was performed following the standard protocol without intravenous contrast. Multiplanar CT image reconstructions of the cervical spine were also generated. COMPARISON:  None. FINDINGS: CT HEAD FINDINGS Brain: No acute intracranial abnormality. Specifically, no hemorrhage, hydrocephalus, mass lesion, acute infarction, or significant intracranial  injury. Vascular: No hyperdense vessel or unexpected calcification. Skull: No acute calvarial abnormality. Sinuses/Orbits: Visualized paranasal sinuses and mastoids clear. Orbital soft tissues unremarkable. Other: None CT CERVICAL SPINE FINDINGS Alignment: Normal Skull base and vertebrae: No acute fracture. No primary bone lesion or focal pathologic process. Soft tissues and spinal canal: No prevertebral fluid or swelling. No visible canal hematoma. Disc levels: Disc space narrowing and spurring from C4-5 thru C7-T1. Degenerative facet disease bilaterally, left greater than right. Upper chest: No acute findings. Other: None IMPRESSION: No acute intracranial abnormality. No acute bony abnormality in the cervical spine. Electronically Signed   By: Charlett Nose M.D.   On: 01/10/2016 16:25   Ct Hip Left Wo Contrast  Result Date: 01/10/2016 CLINICAL DATA:  Fall last night.  Left hip pain. EXAM: CT OF THE LEFT HIP WITHOUT CONTRAST TECHNIQUE: Multidetector CT imaging of the left hip was performed  according to the standard protocol. Multiplanar CT image reconstructions were also generated. COMPARISON:  None. FINDINGS: Bones/Joint/Cartilage Left hip is located. No acute fracture is present. The visualized pelvis is intact. Mild degenerative changes are present at the left hip. Ligaments Suboptimally assessed by CT. Muscles and Tendons Musculature of the left hip and visualized upper extremity is within normal limits. There is no significant joint effusion. No focal muscle injury is evident. Soft tissues Atherosclerotic calcifications are present within the right iliac and femoral artery. The visualized intrapelvic organs are within normal limits. Hysterectomy is noted. IMPRESSION: 1. No acute fracture or focal lesion to explain the patient's left hip pain. 2. Mild degenerative changes of the left hip. 3. Atherosclerosis. Electronically Signed   By: Marin Robertshristopher  Mattern M.D.   On: 01/10/2016 19:07   Dg Shoulder  Left  Result Date: 01/10/2016 CLINICAL DATA:  Status post fall.  Left-sided pain. EXAM: LEFT SHOULDER - 2+ VIEW COMPARISON:  None. FINDINGS: There is no evidence of fracture or dislocation. There is no evidence of arthropathy or other focal bone abnormality. Soft tissues are unremarkable. IMPRESSION: Negative. Electronically Signed   By: Ted Mcalpineobrinka  Dimitrova M.D.   On: 01/10/2016 17:13   Dg Hip Unilat W Or Wo Pelvis 2-3 Views Left  Result Date: 01/10/2016 CLINICAL DATA:  Left buttock pain after a fall yesterday. EXAM: DG HIP (WITH OR WITHOUT PELVIS) 2-3V LEFT COMPARISON:  None. FINDINGS: Diffuse osteopenia. No fracture or dislocation seen. Minimal left hip degenerative changes. Atheromatous arterial calcifications. Mild lower lumbar spine degenerative changes. IMPRESSION: No fracture or dislocation. Electronically Signed   By: Beckie SaltsSteven  Reid M.D.   On: 01/10/2016 17:14    Procedures Procedures (including critical care time)  Medications Ordered in ED Medications  HYDROcodone-acetaminophen (NORCO/VICODIN) 5-325 MG per tablet 1 tablet (1 tablet Oral Given 01/10/16 1638)     Initial Impression / Assessment and Plan / ED Course  I have reviewed the triage vital signs and the nursing notes.  Pertinent labs & imaging results that were available during my care of the patient were reviewed by me and considered in my medical decision making (see chart for details).  Clinical Course     Patient status post fall yesterday. X-rays and CTs without evidence of any acute bony injury. Mostly just soft tissue. Patient's main complaint was left hip. CT scan of that area negative for any fracture. Patient also has 2 skin tear abrasions. Tetanus is up-to-date. Local wound care for the abrasions.  Patients hip pain we treated with hydrocodone. Follow-up with her doctor.  Final Clinical Impressions(s) / ED Diagnoses   Final diagnoses:  Fall, initial encounter  Abrasion  Pain of left hip joint    New  Prescriptions New Prescriptions   HYDROCODONE-ACETAMINOPHEN (NORCO/VICODIN) 5-325 MG TABLET    Take 1-2 tablets by mouth every 6 (six) hours as needed for moderate pain.  I personally performed the services described in this documentation, which was scribed in my presence. The recorded information has been reviewed and is accurate.       Vanetta MuldersScott Erdine Hulen, MD 01/10/16 Ernestina [id number]

## 2016-01-10 NOTE — ED Notes (Signed)
Pt given d/c instructions as per chart. Verbalizes understanding. No questions. Rx x 1 with precautions 

## 2016-01-10 NOTE — ED Notes (Signed)
[facility] unable to [facility] wt bearing to left leg without pain, voided large amt amber urine

## 2016-01-10 NOTE — Discharge Instructions (Signed)
Workup for any significant injuries from the fall without any acute bony injuries. Keep the abrasion skin tears clean with soap and water. (Follow-up with your doctor as needed. Take hydrocodone as needed for pain.

## 2016-01-10 NOTE — ED Triage Notes (Signed)
Pt fell down 2 stairs while carrying large heavy pot last night.  Pt has abrasion to lleft elbow, pain to left head, neck, shoulder, arm and leg.  Pt c/o left hip pain that is preventing her from weight-bearing.

## 2016-01-10 NOTE — ED Notes (Signed)
Patient transported to CT 

## 2016-01-10 NOTE — ED Notes (Signed)
Patient transported to X-ray 

## 2016-01-21 ENCOUNTER — Telehealth: Payer: Self-pay | Admitting: Acute Care

## 2016-01-21 DIAGNOSIS — F17210 Nicotine dependence, cigarettes, uncomplicated: Secondary | ICD-10-CM

## 2016-01-21 NOTE — Telephone Encounter (Signed)
Referral received from Dr Gweneth DimitriWendy McNeill.  Called to discuss Lung Cancer Program and schedule SDMV / CT LMTCB x 1

## 2016-01-28 ENCOUNTER — Ambulatory Visit: Payer: Self-pay

## 2016-02-05 ENCOUNTER — Ambulatory Visit
Admission: RE | Admit: 2016-02-05 | Discharge: 2016-02-05 | Disposition: A | Discharge status: Home / Self Care | Payer: Medicare Other | Source: Ambulatory Visit | Attending: Family Medicine | Admitting: Family Medicine

## 2016-02-05 DIAGNOSIS — Z1231 Encounter for screening mammogram for malignant neoplasm of breast: Secondary | ICD-10-CM

## 2016-02-06 ENCOUNTER — Other Ambulatory Visit: Payer: Self-pay | Admitting: Family Medicine

## 2016-02-06 DIAGNOSIS — R928 Other abnormal and inconclusive findings on diagnostic imaging of breast: Secondary | ICD-10-CM

## 2016-02-12 ENCOUNTER — Other Ambulatory Visit: Payer: Medicare Other

## 2016-02-16 ENCOUNTER — Ambulatory Visit
Admission: RE | Admit: 2016-02-16 | Discharge: 2016-02-16 | Disposition: A | Discharge status: Home / Self Care | Payer: Medicare Other | Source: Ambulatory Visit | Attending: Family Medicine | Admitting: Family Medicine

## 2016-02-16 ENCOUNTER — Other Ambulatory Visit: Payer: Self-pay | Admitting: Family Medicine

## 2016-02-16 DIAGNOSIS — R928 Other abnormal and inconclusive findings on diagnostic imaging of breast: Secondary | ICD-10-CM

## 2016-02-16 DIAGNOSIS — N6310 Unspecified lump in the right breast, unspecified quadrant: Secondary | ICD-10-CM

## 2016-02-16 NOTE — Telephone Encounter (Signed)
Called and spoke with pt and she stated that she was still interested in this appt, but was checking out at [employer].  I advised her that I would call her back on Wednesday in the afternoon.

## 2016-02-18 ENCOUNTER — Other Ambulatory Visit: Payer: Self-pay | Admitting: Family Medicine

## 2016-02-18 ENCOUNTER — Other Ambulatory Visit: Payer: Self-pay | Admitting: Diagnostic Radiology

## 2016-02-18 ENCOUNTER — Ambulatory Visit
Admission: RE | Admit: 2016-02-18 | Discharge: 2016-02-18 | Disposition: A | Discharge status: Home / Self Care | Payer: Medicare Other | Source: Ambulatory Visit | Attending: Family Medicine | Admitting: Family Medicine

## 2016-02-18 DIAGNOSIS — N6310 Unspecified lump in the right breast, unspecified quadrant: Secondary | ICD-10-CM

## 2016-03-03 NOTE — Telephone Encounter (Signed)
Spoke with pt and scheduled SDMV on 03/08/16 at 3:30. CT ordered Nothing further needed.

## 2016-03-08 ENCOUNTER — Ambulatory Visit (INDEPENDENT_AMBULATORY_CARE_PROVIDER_SITE_OTHER)
Admission: RE | Admit: 2016-03-08 | Discharge: 2016-03-08 | Disposition: A | Discharge status: Home / Self Care | Payer: Medicare Other | Source: Ambulatory Visit | Attending: Acute Care | Admitting: Acute Care

## 2016-03-08 ENCOUNTER — Ambulatory Visit (INDEPENDENT_AMBULATORY_CARE_PROVIDER_SITE_OTHER): Payer: Medicare Other | Admitting: Acute Care

## 2016-03-08 ENCOUNTER — Encounter: Payer: Self-pay | Admitting: Acute Care

## 2016-03-08 DIAGNOSIS — Z87891 Personal history of nicotine dependence: Secondary | ICD-10-CM | POA: Diagnosis not present

## 2016-03-08 DIAGNOSIS — F17210 Nicotine dependence, cigarettes, uncomplicated: Secondary | ICD-10-CM | POA: Diagnosis not present

## 2016-03-08 NOTE — Progress Notes (Signed)
Shared Decision Making Visit Lung Cancer Screening Program [phone]([id number])   Eligibility:  Age 71 y.o.  Pack Years Smoking History Calculation 38-pack-year smoking history (# packs/per year x # years smoked)  Recent History of coughing up blood  no  Unexplained weight loss? no ( >Than 15 pounds within the last 6 months )  Prior History Lung / other cancer no (Diagnosis within the last 5 years already requiring surveillance chest CT Scans).  Smoking Status Current Smoker  Former Smokers: Years since quit: Not applicable  Quit Date: Not applicable  Visit Components:  Discussion included one or more decision making aids. yes  Discussion included risk/benefits of screening. yes  Discussion included potential follow up diagnostic testing for abnormal scans. yes  Discussion included meaning and risk of over diagnosis. yes  Discussion included meaning and risk of False Positives. yes  Discussion included meaning of total radiation exposure. yes  Counseling Included:  Importance of adherence to annual lung cancer LDCT screening. yes  Impact of comorbidities on ability to participate in the program. yes  Ability and willingness to under diagnostic treatment. yes  Smoking Cessation Counseling:  Current Smokers:   Discussed importance of smoking cessation. yes  Information about tobacco cessation classes and interventions provided to patient. yes  Patient provided with "ticket" for LDCT Scan. yes  Symptomatic Patient. no  Counseling NA  Diagnosis Code: Tobacco Use Z72.0  Asymptomatic Patient yes  Counseling (Intermediate counseling: > three minutes counseling) [id number]  Former Smokers:   Discussed the importance of maintaining cigarette abstinence. yes  Diagnosis Code: Personal History of Nicotine Dependence. U98.119Z87.891  Information about tobacco cessation classes and interventions provided to patient. Yes  Patient provided with "ticket" for LDCT Scan. yes  Written  Order for Lung Cancer Screening with LDCT placed in Epic. Yes (CT Chest Lung Cancer Screening Low Dose W/O CM) [id number] Z12.2-Screening of respiratory organs Z87.891-Personal history of nicotine dependence   I have spent 25 minutes of face to face time with Paul HalfJoan Carr discussing the risks and benefits of lung cancer screening. We viewed a power point together that explained in detail the above noted topics. We paused at intervals to allow for questions to be asked and answered to ensure understanding.We discussed that the single most powerful action that she can take to decrease her risk of developing lung cancer is to quit smoking. We discussed whether or not she is ready to commit to setting a quit date. She is currently not ready to set a quit date. We discussed options for tools to aid in quitting smoking including nicotine replacement therapy, non-nicotine medications, support groups, Quit Smart classes, and behavior modification. We discussed that often times setting smaller, more achievable goals, such as eliminating 1 cigarette a day for a week and then 2 cigarettes a day for a week can be helpful in slowly decreasing the number of cigarettes smoked. This allows for a sense of accomplishment as well as providing a clinical benefit. I gave her the " Be Stronger Than Your Excuses" card with contact information for community resources, classes, free nicotine replacement therapy, and access to mobile apps, text messaging, and on-line smoking cessation help. I have also given her my card and contact information in the event she needs to contact me. We discussed the time and location of the scan, and that either Jerolyn Shinammy Wilson, CMA, or I will call with the results within 24-48 hours of receiving them. I have provided her with a copy of the power point we  viewed  as a resource in the event they need reinforcement of the concepts we discussed today in the office. The patient verbalized understanding of all of   the above and had no further questions upon leaving the office. They have my contact information in the event they have any further questions.  I spent 3-4 minutes counseling regarding smoking cessation, and risks of continued tobacco abuse.  Bevelyn Ngo, NP 03/08/2016

## 2016-03-09 ENCOUNTER — Telehealth: Payer: Self-pay | Admitting: Acute Care

## 2016-03-09 DIAGNOSIS — F17210 Nicotine dependence, cigarettes, uncomplicated: Secondary | ICD-10-CM

## 2016-03-09 NOTE — Telephone Encounter (Signed)
I have called Mrs. Sarah Francis with the results of her low-dose screening CT. I explained that her scan was read as a Lung RADS 2: nodules that are benign in appearance and behavior with a very low likelihood of becoming a clinically active cancer due to size or lack of growth. Recommendation per radiology is for a repeat LDCT in 12 months. I told her we will order and schedule her follow-up scan for February 2019. I also explained the incidental findings of age advanced coronary artery atherosclerosis, and aortic atherosclerosis. I explained this is a non-gated exam and therefore degree or severity cannot be determined. The patient is currently on statin therapy at the guidance of her primary care physician Dr. Gweneth DimitriWendy McNeill. There was an additional notation of subtle bilateral ill-defined pulmonary nodularity. Radiology suspected atypical infection of indeterminate acuity. I explained this to the patient, and told her that I would forward the results of this scan to her primary care physician. I will request that her primary care physician follow-up as she feels is clinically indicated as she knows this patient well. The patient is currently stable, without any upper respiratory or pulmonary complaints. Sarah Francis verbalized understanding of the above and had no further questions at completion of the call. She does have my contact information in the event she has any further questions.

## 2016-04-14 ENCOUNTER — Telehealth: Payer: Self-pay

## 2016-04-14 NOTE — Telephone Encounter (Signed)
SENT NOTES TO SCHEDULING 

## 2016-04-16 ENCOUNTER — Encounter: Payer: Self-pay | Admitting: Cardiology

## 2016-04-29 ENCOUNTER — Ambulatory Visit: Payer: Medicare Other | Admitting: Cardiology

## 2016-05-17 ENCOUNTER — Encounter (INDEPENDENT_AMBULATORY_CARE_PROVIDER_SITE_OTHER): Payer: Self-pay

## 2016-05-17 ENCOUNTER — Ambulatory Visit (INDEPENDENT_AMBULATORY_CARE_PROVIDER_SITE_OTHER): Payer: Medicare Other | Admitting: Cardiology

## 2016-05-17 ENCOUNTER — Encounter: Payer: Self-pay | Admitting: Cardiology

## 2016-05-17 VITALS — BP 142/86 | Ht 61.0 in | Wt 146.1 lb

## 2016-05-17 DIAGNOSIS — E782 Mixed hyperlipidemia: Secondary | ICD-10-CM

## 2016-05-17 DIAGNOSIS — I2510 Atherosclerotic heart disease of native coronary artery without angina pectoris: Secondary | ICD-10-CM | POA: Diagnosis not present

## 2016-05-17 DIAGNOSIS — R011 Cardiac murmur, unspecified: Secondary | ICD-10-CM

## 2016-05-17 DIAGNOSIS — F17200 Nicotine dependence, unspecified, uncomplicated: Secondary | ICD-10-CM

## 2016-05-17 DIAGNOSIS — I10 Essential (primary) hypertension: Secondary | ICD-10-CM

## 2016-05-17 DIAGNOSIS — I739 Peripheral vascular disease, unspecified: Secondary | ICD-10-CM | POA: Insufficient documentation

## 2016-05-17 NOTE — Progress Notes (Signed)
Cardiology Office Note    Date:  05/18/2016   ID:  Sarah Francis, DOB [birth year], MRN [id number]  PCP:  Gweneth Dimitri, MD  Cardiologist:  Armanda Magic, MD   Chief Complaint  Patient presents with  . Follow-up    coronary artery calcifications, HTN, hyperlipidemia    History of Present Illness:  Sarah Francis is a 71 y.o. female with a history of DM, GERD, HTN who is referred by her PCP, Gweneth Dimitri, MD for coronary artery calcifications on chest CT.  She had a nuclear stress test in 2014 that showed no ischemia.  She recently underwent surveillance CT for lung CA and was found to have coronary artery calcifications.  She has several CRFs including DM, HTN, ongoing tobacco use and family history of vascular disease.  She denies any chest pain or pressure, orthopnea, PND, dizziness, LE edema, syncope or palpitations.  She has some problems with SOB and DOE but is very sedentary and continues to smoke 1ppd.  She does have indigestion symptoms at night that resolves with antacids.    Past Medical History:  Diagnosis Date  . Anxiety   . Coronary artery calcification seen on CAT scan 05/17/2016  . Diabetes mellitus without complication (HCC)   . GERD (gastroesophageal reflux disease)   . Hypertension   . Major depression    initial diagnosis/medication around age 81  . Osteoporosis    w h/o sacral fracture    Past Surgical History:  Procedure Laterality Date  . ABDOMINAL HYSTERECTOMY    . KNEE SURGERY Right 2012    Current Medications: Current Meds  Medication Sig  . atorvastatin (LIPITOR) 10 MG tablet Take 10 mg by mouth daily.  . insulin aspart (NOVOLOG FLEXPEN) 100 UNIT/ML SOPN FlexPen Inject 8-12 Units into the skin 3 (three) times daily with meals. Take 8 units in the morning 10 units before lunch and 12 units before dinner  . Insulin Glargine (LANTUS SOLOSTAR) 100 UNIT/ML SOPN Inject 32 Units into the skin at bedtime.  . Insulin Lispro (HUMALOG KWIKPEN [place]) Inject into the  skin.  Marland Kitchen lisinopril (PRINIVIL,ZESTRIL) 2.5 MG tablet Take 2.5 mg by mouth daily.  . Vitamin D, Ergocalciferol, (DRISDOL) 50000 UNITS CAPS capsule Take 50,000 Units by mouth every 7 (seven) days.    Allergies:   Penicillins   Social History   Social History  . Marital status: Single    Spouse name: N/A  . Number of children: N/A  . Years of education: N/A   Social History Main Topics  . Smoking status: Current Every Day Smoker    Packs/day: 1.00    Years: 88.00  . Smokeless tobacco: Never Used     Comment: Currently smoker 1ppd  . Alcohol use Yes     Comment: 1-2 glasses of Wine per day  . Drug use: No  . Sexual activity: Not Asked   Other Topics Concern  . None   Social History Narrative  . None     Family History:  The patient's family history includes AAA (abdominal aortic aneurysm) in her father; Cancer in her brother; Diabetes in her brother; Heart attack in her mother; Heart disease in her mother; Hypertension in her sister.   ROS:   Please see the history of present illness.    ROS All other systems reviewed and are negative.  No flowsheet data found.     PHYSICAL EXAM:   VS:  BP (!) 142/86   Ht  (1.549 m)   Wt 146  lb 1.9 oz (66.3 kg)   BMI 27.61 kg/m    GEN: Well nourished, well developed, in no acute distress  HEENT: normal  Neck: no JVD, carotid bruits, or masses Cardiac: RRR; no  rubs, or gallops,no edema.  Intact distal pulses bilaterally. 1/6 SM at RUSB to LLSB Respiratory:  clear to auscultation bilaterally, normal work of breathing GI: soft, nontender, nondistended, + BS MS: no deformity or atrophy  Skin: warm and dry, no rash Neuro:  Alert and Oriented x 3, Strength and sensation are intact Psych: euthymic mood, full affect  Wt Readings from Last 3 Encounters:  05/17/16 146 lb 1.9 oz (66.3 kg)  01/10/16 145 lb (65.8 kg)  01/15/13 136 lb (61.7 kg)      Studies/Labs Reviewed:   EKG:  EKG is ordered today.  The ekg ordered today  demonstrates sinus tachycardia at 104bpm with no ST changes  Recent Labs: No results found for requested labs within last 8760 hours.   Lipid Panel No results found for: CHOL, TRIG, HDL, CHOLHDL, VLDL, LDLCALC, LDLDIRECT  Additional studies/ records that were reviewed today include:  Chest CT and office notes    ASSESSMENT:    1. Coronary artery calcification seen on CAT scan   2. Essential hypertension   3. Mixed hyperlipidemia   4. Tobacco use disorder   5. Claudication (HCC)   6. Heart murmur      PLAN:  In order of problems listed above:  1. Coronary artery calcifications on CT scan - I will get a chest CT to assess coronary calcium score.  She has a significant family history of CAD with her mom dying of an MI at age 20 and her father had a ruptured AAA in his 70's.  She is asymptomatic with no angina but has DM so may have silent ischemia.  I will get a stress myoview to rule out ischemia.  2. HTN - BP is controlled on exam today.  She will continue on ACE I 3. Hyperlipidemia with LDL goal < 70 with DM and coronary calcifications.  She will continue on statin.  I will get a copy of her last FLP from her PCP. 4. Tobacco abuse ongoing - I encouraged her to try to cut back and consider quitting - she is not ready to quit yet 5. Reduced LE pulses with foot pain with ambulation as well as hip and knee pain.  I suspect she may have PVD.  I will get LE arterial dopplers to assess further.  6. Heart murmur - I will get a 2D echo to assess    Medication Adjustments/Labs and Tests Ordered: Current medicines are reviewed at length with the patient today.  Concerns regarding medicines are outlined above.  Medication changes, Labs and Tests ordered today are listed in the Patient Instructions below.  Patient Instructions  Medication Instructions:  Your physician recommends that you continue on your current medications as directed. Please refer to the Current Medication list given to  you today.   Labwork: None  Testing/Procedures: Your physician has requested that you have an echocardiogram. Echocardiography is a painless test that uses sound waves to create images of your heart. It provides your doctor with information about the size and shape of your heart and how well your heart's chambers and valves are working. This procedure takes approximately one hour. There are no restrictions for this procedure.  Dr. Mayford Knife recommends you have a NUCLEAR STRESS TEST.  Dr. Mayford Knife recommends you have a CALCIUM  SCORE.  Your physician has requested that you have a lower extremity arterial duplex. During this test, ultrasound is used to evaluate arterial blood flow in the legs. Allow one hour for this exam. There are no restrictions or special instructions.  Follow-Up: Your physician wants you to follow-up in: 1 year with Dr. Mayford Knife. You will receive a reminder letter in the mail two months in advance. If you don't receive a letter, please call our office to schedule the follow-up appointment.  Any Other Special Instructions Will Be Listed Below (If Applicable).     If you need a refill on your cardiac medications before your next appointment, please call your pharmacy.      Signed, Armanda Magic, MD  05/18/2016 9:47 AM    [facility] [address] [place], [place], [place]  [address] Phone: [phone]; Fax: [phone]

## 2016-05-17 NOTE — Patient Instructions (Signed)
Medication Instructions:  Your physician recommends that you continue on your current medications as directed. Please refer to the Current Medication list given to you today.   Labwork: None  Testing/Procedures: Your physician has requested that you have an echocardiogram. Echocardiography is a painless test that uses sound waves to create images of your heart. It provides your doctor with information about the size and shape of your heart and how well your heart's chambers and valves are working. This procedure takes approximately one hour. There are no restrictions for this procedure.  Dr. Mayford Knife recommends you have a NUCLEAR STRESS TEST.  Dr. Mayford Knife recommends you have a CALCIUM SCORE.  Your physician has requested that you have a lower extremity arterial duplex. During this test, ultrasound is used to evaluate arterial blood flow in the legs. Allow one hour for this exam. There are no restrictions or special instructions.  Follow-Up: Your physician wants you to follow-up in: 1 year with Dr. Mayford Knife. You will receive a reminder letter in the mail two months in advance. If you don't receive a letter, please call our office to schedule the follow-up appointment.  Any Other Special Instructions Will Be Listed Below (If Applicable).     If you need a refill on your cardiac medications before your next appointment, please call your pharmacy.

## 2016-05-25 ENCOUNTER — Telehealth: Payer: Self-pay

## 2016-05-25 NOTE — Telephone Encounter (Signed)
Patient is currently taking Lipitor 40 mg daily. She was not taking as directed. Her PCP instructed her to take every day and watch her diet. She has repeat labs scheduled 5/15. Will request labs at that time for Dr. Norris Cross recommendations. Patient was grateful for call and agrees with treatment plan.

## 2016-05-25 NOTE — Telephone Encounter (Signed)
-----   Message from Quintella Reichert, MD sent at 05/22/2016 11:23 PM EDT ----- Please find out how long patient has been on Lipitor  daily

## 2016-05-27 ENCOUNTER — Telehealth (HOSPITAL_COMMUNITY): Payer: Self-pay | Admitting: *Deleted

## 2016-05-27 ENCOUNTER — Other Ambulatory Visit: Payer: Self-pay | Admitting: Cardiology

## 2016-05-27 DIAGNOSIS — R0989 Other specified symptoms and signs involving the circulatory and respiratory systems: Secondary | ICD-10-CM

## 2016-05-27 NOTE — Telephone Encounter (Signed)
Left message on voicemail per DPR in reference to upcoming appointment scheduled on 06/01/16 with detailed instructions given per Myocardial Perfusion Study Information Sheet for the test. LM to arrive 15 minutes early, and that it is imperative to arrive on time for appointment to keep from having the test rescheduled. If you need to cancel or reschedule your appointment, please call the office within 24 hours of your appointment. Failure to do so may result in a cancellation of your appointment, and a $50 no show fee. Phone number given for call back for any questions. Ricky AlaSmith, Fumi Guadron Jacqueline

## 2016-06-01 ENCOUNTER — Other Ambulatory Visit: Payer: Self-pay

## 2016-06-01 ENCOUNTER — Ambulatory Visit (HOSPITAL_BASED_OUTPATIENT_CLINIC_OR_DEPARTMENT_OTHER): Payer: Medicare Other

## 2016-06-01 ENCOUNTER — Ambulatory Visit (INDEPENDENT_AMBULATORY_CARE_PROVIDER_SITE_OTHER)
Admission: RE | Admit: 2016-06-01 | Discharge: 2016-06-01 | Disposition: A | Discharge status: Home / Self Care | Payer: Self-pay | Source: Ambulatory Visit | Attending: Cardiology | Admitting: Cardiology

## 2016-06-01 ENCOUNTER — Ambulatory Visit (HOSPITAL_COMMUNITY)
Admission: RE | Admit: 2016-06-01 | Discharge: 2016-06-01 | Disposition: A | Discharge status: Home / Self Care | Payer: Medicare Other | Source: Ambulatory Visit | Attending: Cardiology | Admitting: Cardiology

## 2016-06-01 ENCOUNTER — Encounter: Payer: Self-pay | Admitting: Cardiology

## 2016-06-01 ENCOUNTER — Ambulatory Visit (HOSPITAL_COMMUNITY): Payer: Medicare Other | Attending: Cardiovascular Disease

## 2016-06-01 DIAGNOSIS — I2510 Atherosclerotic heart disease of native coronary artery without angina pectoris: Secondary | ICD-10-CM | POA: Diagnosis not present

## 2016-06-01 DIAGNOSIS — R011 Cardiac murmur, unspecified: Secondary | ICD-10-CM | POA: Insufficient documentation

## 2016-06-01 DIAGNOSIS — I70203 Unspecified atherosclerosis of native arteries of extremities, bilateral legs: Secondary | ICD-10-CM | POA: Diagnosis not present

## 2016-06-01 DIAGNOSIS — R0989 Other specified symptoms and signs involving the circulatory and respiratory systems: Secondary | ICD-10-CM | POA: Diagnosis not present

## 2016-06-01 DIAGNOSIS — E1151 Type 2 diabetes mellitus with diabetic peripheral angiopathy without gangrene: Secondary | ICD-10-CM | POA: Insufficient documentation

## 2016-06-01 DIAGNOSIS — I700 Atherosclerosis of aorta: Secondary | ICD-10-CM | POA: Insufficient documentation

## 2016-06-01 DIAGNOSIS — R938 Abnormal findings on diagnostic imaging of other specified body structures: Secondary | ICD-10-CM | POA: Diagnosis not present

## 2016-06-01 DIAGNOSIS — Z8249 Family history of ischemic heart disease and other diseases of the circulatory system: Secondary | ICD-10-CM | POA: Insufficient documentation

## 2016-06-01 DIAGNOSIS — E119 Type 2 diabetes mellitus without complications: Secondary | ICD-10-CM | POA: Diagnosis not present

## 2016-06-01 DIAGNOSIS — Z720 Tobacco use: Secondary | ICD-10-CM | POA: Insufficient documentation

## 2016-06-01 DIAGNOSIS — I10 Essential (primary) hypertension: Secondary | ICD-10-CM | POA: Diagnosis not present

## 2016-06-01 DIAGNOSIS — E785 Hyperlipidemia, unspecified: Secondary | ICD-10-CM | POA: Insufficient documentation

## 2016-06-01 DIAGNOSIS — I739 Peripheral vascular disease, unspecified: Secondary | ICD-10-CM

## 2016-06-01 LAB — MYOCARDIAL PERFUSION IMAGING
CHL CUP NUCLEAR SRS: 0
CHL CUP NUCLEAR SSS: 9
CHL CUP RESTING HR STRESS: 89 {beats}/min
CSEPED: 5 min
CSEPEW: 3.5 METS
CSEPHR: 100 %
CSEPPHR: 153 {beats}/min
Exercise duration (sec): 0 s
LV dias vol: 60 mL (ref 46–106)
LVSYSVOL: 17 mL
MPHR: 152 {beats}/min
RATE: 0.3
SDS: 9
TID: 0.93

## 2016-06-01 MED ORDER — TECHNETIUM TC 99M TETROFOSMIN IV KIT
33.0000 | PACK | Freq: Once | INTRAVENOUS | Status: AC | PRN
  Administered 2016-06-01: 33 via INTRAVENOUS
  Filled 2016-06-01: qty 33

## 2016-06-01 MED ORDER — TECHNETIUM TC 99M TETROFOSMIN IV KIT
11.0000 | PACK | Freq: Once | INTRAVENOUS | Status: AC | PRN
  Administered 2016-06-01: 11 via INTRAVENOUS
  Filled 2016-06-01: qty 11

## 2016-06-03 ENCOUNTER — Telehealth: Payer: Self-pay | Admitting: Cardiology

## 2016-06-03 NOTE — Telephone Encounter (Signed)
New message     Pt is returning WorthingKaty call for test results

## 2016-06-03 NOTE — Telephone Encounter (Signed)
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 Wall thickness is normal   Notified the pt of echo results and myoview results per Dr Mayford Knifeurner.  Pt verbalized understanding.

## 2016-06-04 ENCOUNTER — Telehealth: Payer: Self-pay

## 2016-06-04 NOTE — Telephone Encounter (Signed)
This encounter was created in error - please disregard.

## 2016-06-04 NOTE — Telephone Encounter (Signed)
Call to patient with results, appointment scheduled with Dr Allyson SabalBerry 06/25/2016 at 1400.

## 2016-06-04 NOTE — Telephone Encounter (Signed)
-----   Message from Quintella Reichertraci R Turner, MD sent at 06/04/2016 12:54 PM EDT ----- >50% stenosis in the bilateral common and external iliac arteries.  50-74% in the bilateral superficial femoral arteries. Three vessel run off, bilaterally.  Please refer to Dr. Allyson SabalBerry or Kirke CorinArida

## 2016-06-04 NOTE — Telephone Encounter (Signed)
See result note.  

## 2016-06-04 NOTE — Telephone Encounter (Signed)
Notes recorded by Patricia PesaSuits, Teri D, RN on 06/04/2016 at 5:19 PM EDT Pt is currently taking ASA 81 mg daily as well as Atorvastatin 40 mg She states she has just started taking on a regular basis within the last 6 weeks She has had labs with PCP Dr Selena BattenWendy McNeil in the last 4-6 weeks Her HgBA1C was 6.8 . Has been between 6-7 for the past year Has follow up labs with Dr Uvaldo RisingMcNeil within the month. ------  Notes recorded by Quintella Reicherturner, Traci R, MD on 06/04/2016 at 1:35 PM EDT Coronary calcium score is high at 98th % for age and sex with dense LM and 3 vessel coronary calcification and aortic atherosclerosis - nuclear stress test showed no ischemia. Needs aggressive risk factor modification. Please start ASA 81mg  daily and get an FLP, ALT and HbA1C. Followup with me in 6 months

## 2016-06-18 ENCOUNTER — Telehealth: Payer: Self-pay | Admitting: Cardiology

## 2016-06-18 NOTE — Telephone Encounter (Signed)
New Message  Pt voiced she had blood work at [facility]McNeil[facility] Office this morning//she is wanting to let Karsten FellsKaty Kemp know that's it.

## 2016-06-18 NOTE — Telephone Encounter (Signed)
Called patient who stated she had some lab work performed at Dr. Darrell JewelMcNeill's office and she wanted to let Dr. Norris Crossurner's covering nurse to know. I told patient I would call Dr. Melvyn NethMcNeils office to have her lab work faxed to our office. She thanked me for my call.   I called Dr. Darrell JewelMcNeill's office Deboraha Sprang([facility] [employer] at phone # [phone], spoke with Neysa Bonitohristy requesting to have patient's most recent lab work faxed to our office. They told me they would fax it to our office.

## 2016-06-23 NOTE — Telephone Encounter (Signed)
Labs scanned in and routed to Dr. Mayford Knifeurner for review.

## 2016-06-25 ENCOUNTER — Encounter: Payer: Self-pay | Admitting: Cardiovascular Disease

## 2016-06-25 ENCOUNTER — Ambulatory Visit (INDEPENDENT_AMBULATORY_CARE_PROVIDER_SITE_OTHER): Payer: Medicare Other | Admitting: Cardiovascular Disease

## 2016-06-25 VITALS — BP 150/78 | HR 98 | Ht 61.0 in | Wt 148.0 lb

## 2016-06-25 DIAGNOSIS — I739 Peripheral vascular disease, unspecified: Secondary | ICD-10-CM

## 2016-06-25 NOTE — Assessment & Plan Note (Signed)
Sarah Francis was referred to me by Dr. Mayford Knifeurner for evaluation of PAD. She has risk factors and include over 50 pack years of tobacco abuse recalcitrant to risk factor modification, treated hypertension, hyperlipidemia and diabetes. She does have plantar fasciitis. She really does not give a good history for lifestyle limiting claudication. Recent Dopplers performed 06/01/16 revealed a right ABI 0.74 and a left of 0.9. She does have moderate disease in her proximal SFAs bilaterally. She has palpable pedal pulses. At this point, I do not think she has an indication for an invasive approach. I recommend exercise therapy and will continue to monitor her Dopplers on annual basis. I will see her back when necessary.

## 2016-06-25 NOTE — Progress Notes (Signed)
06/25/2016 Paul HalfJoan Mctigue   04-10-1945  [id number]  Primary Physician Gweneth DimitriMcNeill, Wendy, MD Primary Cardiologist: Runell GessJonathan J Flor Whitacre MD Roseanne RenoFACP, FACC, FAHA, FSCAI  HPI:  Ms. Lucious GrovesShumate is a delightful 71 year old mildly overweight Caucasian female mother of one daughter, grandmother of 2 grandchildren referred to me by Dr. Mayford Knifeurner for peripheral vascular evaluation because of abnormal Doppler studies. She is a third grade teacher. Cardiac risk factors notable for 50 pack years smoking one pack per day recalcitrant to risk factor modification. She drinks 2-3 glasses of wine a night. She has treated hypertension, diabetes and hyperlipidemia as well as family history of heart disease. Mother died in her 690s of a myocardial infarction. Her father died in his 690s of an abdominal aortic aneurysm. She has never had a heart attack or stroke. She denies chest pain but does have some mild dyspnea. She has coronary calcification on chest CT and a negative Myoview stress test. Lower extremity Dopplers performed 06/01/16 revealed right ABI 0.74 and a left of 0.9. She has moderate bilateral proximal SFA disease which does not appear to be significant obstructive.   Current Outpatient Prescriptions  Medication Sig Dispense Refill  . atorvastatin (LIPITOR) 40 MG tablet Take 40 mg by mouth daily.    Marland Kitchen. HYDROcodone-acetaminophen (NORCO/VICODIN) 5-325 MG tablet Take 1-2 tablets by mouth every 6 (six) hours as needed for moderate pain. 10 tablet 0  . insulin aspart (NOVOLOG FLEXPEN) 100 UNIT/ML SOPN FlexPen Inject 8-12 Units into the skin 3 (three) times daily with meals. Take 8 units in the morning 10 units before lunch and 12 units before dinner    . Insulin Glargine (LANTUS SOLOSTAR) 100 UNIT/ML SOPN Inject 32 Units into the skin at bedtime.    . Insulin Lispro (HUMALOG KWIKPEN [place]) Inject into the skin.    Marland Kitchen. lisinopril (PRINIVIL,ZESTRIL) 2.5 MG tablet Take 2.5 mg by mouth daily.    . ranitidine (ZANTAC) 150 MG tablet Take 150  mg by mouth 2 (two) times daily as needed for heartburn.    . Vitamin D, Ergocalciferol, (DRISDOL) 50000 UNITS CAPS capsule Take 50,000 Units by mouth every 7 (seven) days.     No current facility-administered medications for this visit.     Allergies  Allergen Reactions  . Penicillins Rash    Social History   Social History  . Marital status: Single    Spouse name: N/A  . Number of children: N/A  . Years of education: N/A   Occupational History  . Not on file.   Social History Main Topics  . Smoking status: Current Every Day Smoker    Packs/day: 1.00    Years: 88.00  . Smokeless tobacco: Never Used     Comment: Currently smoker 1ppd  . Alcohol use Yes     Comment: 1-2 glasses of Wine per day  . Drug use: No  . Sexual activity: Not on file   Other Topics Concern  . Not on file   Social History Narrative  . No narrative on file     Review of Systems: General: negative for chills, fever, night sweats or weight changes.  Cardiovascular: negative for chest pain, dyspnea on exertion, edema, orthopnea, palpitations, paroxysmal nocturnal dyspnea or shortness of breath Dermatological: negative for rash Respiratory: negative for cough or wheezing Urologic: negative for hematuria Abdominal: negative for nausea, vomiting, diarrhea, bright red blood per rectum, melena, or hematemesis Neurologic: negative for visual changes, syncope, or dizziness All other systems reviewed and are otherwise negative except  as noted above.    Blood pressure (!) 150/78, pulse 98, height 5\' 1"  (1.549 m), weight 148 lb (67.1 kg), SpO2 98 %.  General appearance: alert and no distress Neck: no adenopathy, no carotid bruit, no JVD, supple, symmetrical, trachea midline and thyroid not enlarged, symmetric, no tenderness/mass/nodules Lungs: clear to auscultation bilaterally Heart: regular rate and rhythm, S1, S2 normal, no murmur, click, rub or gallop Extremities: extremities normal, atraumatic, no  cyanosis or edema  EKG not performed today  ASSESSMENT AND PLAN:   Peripheral arterial disease (HCC) Ms. Korpela was referred to me by Dr. Mayford Knife for evaluation of PAD. She has risk factors and include over 50 pack years of tobacco abuse recalcitrant to risk factor modification, treated hypertension, hyperlipidemia and diabetes. She does have plantar fasciitis. She really does not give a good history for lifestyle limiting claudication. Recent Dopplers performed 06/01/16 revealed a right ABI 0.74 and a left of 0.9. She does have moderate disease in her proximal SFAs bilaterally. She has palpable pedal pulses. At this point, I do not think she has an indication for an invasive approach. I recommend exercise therapy and will continue to monitor her Dopplers on annual basis. I will see her back when necessary.      Runell Gess MD FACP,FACC,FAHA, [facility] 06/25/2016 2:14 PM

## 2016-06-25 NOTE — Patient Instructions (Signed)
Medication Instructions: Your physician recommends that you continue on your current medications as directed. Please refer to the Current Medication list given to you today.  Testing/Procedures: Your physician has requested that you have a lower extremity arterial duplex. During this test, ultrasound is used to evaluate arterial blood flow in the legs. Allow one hour for this exam. There are no restrictions or special instructions.  Your physician has requested that you have an ankle brachial index (ABI). During this test an ultrasound and blood pressure cuff are used to evaluate the arteries that supply the arms and legs with blood. Allow thirty minutes for this exam. There are no restrictions or special instructions.  (In 1 year)  Follow-Up: Your physician recommends that you schedule a follow-up appointment as needed with Dr. Allyson SabalBerry.

## 2017-03-14 ENCOUNTER — Inpatient Hospital Stay: Admission: RE | Admit: 2017-03-14 | Payer: Medicare Other | Source: Ambulatory Visit

## 2017-03-21 ENCOUNTER — Ambulatory Visit (INDEPENDENT_AMBULATORY_CARE_PROVIDER_SITE_OTHER)
Admission: RE | Admit: 2017-03-21 | Discharge: 2017-03-21 | Disposition: A | Discharge status: Home / Self Care | Payer: Medicare Other | Source: Ambulatory Visit | Attending: Acute Care | Admitting: Acute Care

## 2017-03-21 DIAGNOSIS — F17210 Nicotine dependence, cigarettes, uncomplicated: Secondary | ICD-10-CM

## 2017-03-23 ENCOUNTER — Other Ambulatory Visit: Payer: Self-pay | Admitting: Acute Care

## 2017-03-23 DIAGNOSIS — F17210 Nicotine dependence, cigarettes, uncomplicated: Secondary | ICD-10-CM

## 2017-03-23 DIAGNOSIS — Z122 Encounter for screening for malignant neoplasm of respiratory organs: Secondary | ICD-10-CM

## 2017-06-21 ENCOUNTER — Other Ambulatory Visit: Payer: Self-pay

## 2017-06-21 ENCOUNTER — Encounter (HOSPITAL_COMMUNITY): Payer: Self-pay | Admitting: Emergency Medicine

## 2017-06-21 ENCOUNTER — Emergency Department (HOSPITAL_COMMUNITY)
Admission: EM | Admit: 2017-06-21 | Discharge: 2017-06-21 | Disposition: A | Discharge status: Home / Self Care | Payer: Medicare Other | Attending: Emergency Medicine | Admitting: Emergency Medicine

## 2017-06-21 DIAGNOSIS — S90562A Insect bite (nonvenomous), left ankle, initial encounter: Secondary | ICD-10-CM | POA: Insufficient documentation

## 2017-06-21 DIAGNOSIS — Y999 Unspecified external cause status: Secondary | ICD-10-CM | POA: Diagnosis not present

## 2017-06-21 DIAGNOSIS — Y9389 Activity, other specified: Secondary | ICD-10-CM | POA: Diagnosis not present

## 2017-06-21 DIAGNOSIS — E1142 Type 2 diabetes mellitus with diabetic polyneuropathy: Secondary | ICD-10-CM | POA: Insufficient documentation

## 2017-06-21 DIAGNOSIS — I10 Essential (primary) hypertension: Secondary | ICD-10-CM | POA: Insufficient documentation

## 2017-06-21 DIAGNOSIS — Z79899 Other long term (current) drug therapy: Secondary | ICD-10-CM | POA: Diagnosis not present

## 2017-06-21 DIAGNOSIS — Z794 Long term (current) use of insulin: Secondary | ICD-10-CM | POA: Diagnosis not present

## 2017-06-21 DIAGNOSIS — W57XXXA Bitten or stung by nonvenomous insect and other nonvenomous arthropods, initial encounter: Secondary | ICD-10-CM | POA: Diagnosis not present

## 2017-06-21 DIAGNOSIS — F17210 Nicotine dependence, cigarettes, uncomplicated: Secondary | ICD-10-CM | POA: Diagnosis not present

## 2017-06-21 DIAGNOSIS — Y9289 Other specified places as the place of occurrence of the external cause: Secondary | ICD-10-CM | POA: Diagnosis not present

## 2017-06-21 DIAGNOSIS — L03116 Cellulitis of left lower limb: Secondary | ICD-10-CM

## 2017-06-21 MED ORDER — DOXYCYCLINE HYCLATE 100 MG PO CAPS: 100.0000 mg | ORAL_CAPSULE | Freq: Two times a day (BID) | ORAL | 0 refills | Status: DC

## 2017-06-21 MED ORDER — DOXYCYCLINE HYCLATE 100 MG PO TABS
100.0000 mg | ORAL_TABLET | Freq: Once | ORAL | Status: AC
  Administered 2017-06-21: 100 mg via ORAL
  Filled 2017-06-21: qty 1

## 2017-06-21 MED ORDER — HYDROXYZINE HCL 25 MG PO TABS: 25.0000 mg | ORAL_TABLET | Freq: Four times a day (QID) | ORAL | 0 refills | Status: DC | PRN

## 2017-06-21 MED ORDER — HYDROXYZINE HCL 25 MG PO TABS
25.0000 mg | ORAL_TABLET | Freq: Once | ORAL | Status: AC
Start: 2017-06-21 — End: 2017-06-21
  Administered 2017-06-21: 25 mg via ORAL
  Filled 2017-06-21: qty 1

## 2017-06-21 MED ORDER — ACETAMINOPHEN 325 MG PO TABS
650.0000 mg | ORAL_TABLET | Freq: Once | ORAL | Status: AC
  Administered 2017-06-21: 650 mg via ORAL
  Filled 2017-06-21: qty 2

## 2017-06-21 NOTE — ED Provider Notes (Signed)
[facility]-EMERGENCY DEPT Provider Note   CSN: [id number] Arrival date & time: 06/21/17  0211     History   Chief Complaint Chief Complaint  Patient presents with  . Insect Bite    HPI Sarah Francis is a 72 y.o. female.  HPI  This is a 72 year old female with a history of diabetes, coronary artery disease, hypertension who presents with a bite to the left ankle.  Patient reports that she was bitten by an insect sometime on Saturday.  She reports that it was in a year with a good.  She found it in her sock.  Since that time she has had itching and burning to the left ankle.  She kept her ankle elevated yesterday which seemed to help; however, she woke up tonight with worsening redness and warmth around the lower leg.  She has not had any fevers.  She did note a blister on Sunday morning.  Patient has taken Benadryl with minimal relief of her symptoms.  Past Medical History:  Diagnosis Date  . Anxiety   . Aortic atherosclerosis (HCC)   . Coronary artery calcification seen on CAT scan 05/17/2016  . Diabetes mellitus without complication (HCC)   . GERD (gastroesophageal reflux disease)   . Hypertension   . Major depression    initial diagnosis/medication around age 77  . Osteoporosis    w h/o sacral fracture    Patient Active Problem List   Diagnosis Date Noted  . Peripheral arterial disease (HCC) 06/25/2016  . Aortic atherosclerosis (HCC)   . Coronary artery calcification seen on CAT scan 05/17/2016  . Claudication (HCC) 05/17/2016  . Heart murmur 05/17/2016  . Chest pain 12/25/2012  . Hypertension   . 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  . Polyneuropathy in diabetes(357.2) 12/22/2012  . Plantar fasciitis, bilateral 12/22/2012  . Mixed hyperlipidemia 12/22/2012  . Osteoporosis, unspecified 12/22/2012  . Vitamin D deficiency 12/22/2012  . Restless legs syndrome (RLS) 12/22/2012  . Anxiety 12/22/2012  . Sleep disturbance  12/22/2012  . Tobacco use disorder 12/22/2012  . Alcohol dependence (HCC) 12/22/2012    Past Surgical History:  Procedure Laterality Date  . ABDOMINAL HYSTERECTOMY    . KNEE SURGERY Right 2012     OB History   None      Home Medications    Prior to Admission medications   Medication Sig Start Date End Date Taking? Authorizing Provider  atorvastatin (LIPITOR) 40 MG tablet Take 40 mg by mouth daily.    [provider]  doxycycline (VIBRAMYCIN) 100 MG capsule Take 1 capsule (100 mg total) by mouth 2 (two) times daily. 06/21/17   Rodriguez Aguinaldo, Mayer Masker, MD  HYDROcodone-acetaminophen (NORCO/VICODIN) 5-325 MG tablet Take 1-2 tablets by mouth every 6 (six) hours as needed for moderate pain. 01/10/16   Vanetta Mulders, MD  insulin aspart (NOVOLOG FLEXPEN) 100 UNIT/ML SOPN FlexPen Inject 8-12 Units into the skin 3 (three) times daily with meals. Take 8 units in the morning 10 units before lunch and 12 units before dinner    [provider]  Insulin Glargine (LANTUS SOLOSTAR) 100 UNIT/ML SOPN Inject 32 Units into the skin at bedtime.    [provider]  Insulin Lispro (HUMALOG KWIKPEN [place]) Inject into the skin.    [provider]  lisinopril (PRINIVIL,ZESTRIL) 2.5 MG tablet Take 2.5 mg by mouth daily.    [provider]  ranitidine (ZANTAC) 150 MG tablet Take 150 mg by mouth 2 (two) times daily as needed for  heartburn.    [provider]  Vitamin D, Ergocalciferol, (DRISDOL) 50000 UNITS CAPS capsule Take 50,000 Units by mouth every 7 (seven) days.    [provider]    Family History Family History  Problem Relation Age of Onset  . Heart attack Mother   . Heart disease Mother   . AAA (abdominal aortic aneurysm) Father   . Cancer Brother   . Hypertension Sister   . Diabetes Brother     Social History Social History   Tobacco Use  . Smoking status: Current Every Day Smoker    Packs/day: 1.00    Years: 88.00    Pack years:  88.00  . Smokeless tobacco: Never Used  . Tobacco comment: Currently smoker 1ppd  Substance Use Topics  . Alcohol use: Yes    Comment: 1-2 glasses of Wine per day  . Drug use: No     Allergies   Penicillins   Review of Systems Review of Systems  Constitutional: Negative for fever.  Skin: Positive for color change. Negative for wound.  All other systems reviewed and are negative.    Physical Exam Updated Vital Signs BP (!) 196/91 (BP Location: Right Arm)   Pulse (!) 105   Temp 98.4 F (36.9 C) (Oral)   Resp 15   Ht  (1.549 m)   Wt 68 kg (150 lb)   SpO2 98%   BMI 28.34 kg/m   Physical Exam  Constitutional: She is oriented to person, place, and time. She appears well-developed and well-nourished.  HENT:  Head: Normocephalic and atraumatic.  Cardiovascular: Normal rate, regular rhythm and normal heart sounds.  Pulmonary/Chest: Effort normal. No respiratory distress. She has wheezes.  Musculoskeletal: She exhibits edema.  Neurological: She is alert and oriented to person, place, and time.  Skin: Skin is warm and dry.  Mild erythema noted about the left ankle, there is a 1 cm blood blister over the medial aspect of the ankle, it is intact, erythema stretches up to the mid calf, there is slight associated warmth, swelling noted of the foot, ankle  Psychiatric: She has a normal mood and affect.  Nursing note and vitals reviewed.    ED Treatments / Results  Labs (all labs ordered are listed, but only abnormal results are displayed) Labs Reviewed - No data to display  EKG None  Radiology No results found.  Procedures Procedures (including critical care time)  Medications Ordered in ED Medications  hydrOXYzine (ATARAX/VISTARIL) tablet 25 mg (has no administration in time range)  doxycycline (VIBRA-TABS) tablet 100 mg (has no administration in time range)  acetaminophen (TYLENOL) tablet 650 mg (has no administration in time range)     Initial  Impression / Assessment and Plan / ED Course  I have reviewed the triage vital signs and the nursing notes.  Pertinent labs & imaging results that were available during my care of the patient were reviewed by me and considered in my medical decision making (see chart for details).     Patient reports that she is on insect bite her.  Since that time she has developed warmth and erythema to the left lower leg.  She is overall nontoxic-appearing.  Vital signs notable for blood pressure 196/91.  She denies any systemic symptoms including fevers.  She does have a blood blister at the site of the reported bite as well as some erythema extending proximally.  Would be concerned at this point for cellulitis superimposed on local allergic reaction.  After research,  earwax do not bite but they do pinch.  No venom.  Patient was given Atarax and doxycycline.  Recommend continued elevation and supportive measures at home.  Will discharge home with a course of doxycycline.  Patient was given strict return precautions.  After history, exam, and medical workup I feel the patient has been appropriately medically screened and is safe for discharge home. Pertinent diagnoses were discussed with the patient. Patient was given return precautions.   Final Clinical Impressions(s) / ED Diagnoses   Final diagnoses:  Insect bite of left ankle, initial encounter  Cellulitis of left lower extremity    ED Discharge Orders        Ordered    doxycycline (VIBRAMYCIN) 100 MG capsule  2 times daily     06/21/17 0327       Jendayi Berling, Mayer Masker, MD 06/21/17 [id number]

## 2017-06-21 NOTE — ED Triage Notes (Signed)
Pt reports getting bit by an insect on 06/18/17 and then on 06/19/17 began to have swelling to left ankle and foot at site of bite. Pt reports soaking foot in peroxide after the bite.

## 2017-06-21 NOTE — ED Notes (Signed)
Bed: WTR5 Expected date:  Expected time:  Means of arrival:  Comments: 

## 2017-06-21 NOTE — Discharge Instructions (Addendum)
You were seen today after being bitten by an insect.  You likely have a infection of the skin.  Start antibiotics as directed.  Continue Benadryl as needed for itching.  Follow-up closely with your primary doctor for recheck.  If you note that redness or swelling progresses further, you need to be reevaluated immediately.

## 2017-12-28 ENCOUNTER — Other Ambulatory Visit: Payer: Self-pay | Admitting: Family Medicine

## 2017-12-28 DIAGNOSIS — Z1231 Encounter for screening mammogram for malignant neoplasm of breast: Secondary | ICD-10-CM

## 2018-03-23 ENCOUNTER — Inpatient Hospital Stay: Admission: RE | Admit: 2018-03-23 | Payer: Medicare Other | Source: Ambulatory Visit

## 2018-06-16 ENCOUNTER — Encounter (HOSPITAL_COMMUNITY): Payer: Self-pay | Admitting: *Deleted

## 2018-06-16 ENCOUNTER — Other Ambulatory Visit: Payer: Self-pay

## 2018-06-16 ENCOUNTER — Emergency Department (HOSPITAL_COMMUNITY): Payer: Medicare Other

## 2018-06-16 ENCOUNTER — Inpatient Hospital Stay (HOSPITAL_COMMUNITY)
Admission: EM | Admit: 2018-06-16 | Discharge: 2018-06-21 | DRG: 246 | Disposition: A | Discharge status: Home / Self Care | Payer: Medicare Other | Attending: Cardiology | Admitting: Cardiology

## 2018-06-16 DIAGNOSIS — Z79899 Other long term (current) drug therapy: Secondary | ICD-10-CM

## 2018-06-16 DIAGNOSIS — Z833 Family history of diabetes mellitus: Secondary | ICD-10-CM | POA: Diagnosis not present

## 2018-06-16 DIAGNOSIS — K219 Gastro-esophageal reflux disease without esophagitis: Secondary | ICD-10-CM | POA: Diagnosis present

## 2018-06-16 DIAGNOSIS — I10 Essential (primary) hypertension: Secondary | ICD-10-CM | POA: Diagnosis present

## 2018-06-16 DIAGNOSIS — Z955 Presence of coronary angioplasty implant and graft: Secondary | ICD-10-CM

## 2018-06-16 DIAGNOSIS — M810 Age-related osteoporosis without current pathological fracture: Secondary | ICD-10-CM | POA: Diagnosis present

## 2018-06-16 DIAGNOSIS — R079 Chest pain, unspecified: Secondary | ICD-10-CM | POA: Diagnosis present

## 2018-06-16 DIAGNOSIS — F17200 Nicotine dependence, unspecified, uncomplicated: Secondary | ICD-10-CM | POA: Diagnosis present

## 2018-06-16 DIAGNOSIS — F17210 Nicotine dependence, cigarettes, uncomplicated: Secondary | ICD-10-CM | POA: Diagnosis present

## 2018-06-16 DIAGNOSIS — I2510 Atherosclerotic heart disease of native coronary artery without angina pectoris: Secondary | ICD-10-CM | POA: Diagnosis present

## 2018-06-16 DIAGNOSIS — Z794 Long term (current) use of insulin: Secondary | ICD-10-CM

## 2018-06-16 DIAGNOSIS — I739 Peripheral vascular disease, unspecified: Secondary | ICD-10-CM | POA: Diagnosis not present

## 2018-06-16 DIAGNOSIS — R011 Cardiac murmur, unspecified: Secondary | ICD-10-CM | POA: Diagnosis present

## 2018-06-16 DIAGNOSIS — E1149 Type 2 diabetes mellitus with other diabetic neurological complication: Secondary | ICD-10-CM | POA: Diagnosis present

## 2018-06-16 DIAGNOSIS — E782 Mixed hyperlipidemia: Secondary | ICD-10-CM | POA: Diagnosis present

## 2018-06-16 DIAGNOSIS — E1151 Type 2 diabetes mellitus with diabetic peripheral angiopathy without gangrene: Secondary | ICD-10-CM | POA: Diagnosis present

## 2018-06-16 DIAGNOSIS — Z8249 Family history of ischemic heart disease and other diseases of the circulatory system: Secondary | ICD-10-CM

## 2018-06-16 DIAGNOSIS — Z79891 Long term (current) use of opiate analgesic: Secondary | ICD-10-CM

## 2018-06-16 DIAGNOSIS — Z1159 Encounter for screening for other viral diseases: Secondary | ICD-10-CM | POA: Diagnosis not present

## 2018-06-16 DIAGNOSIS — I214 Non-ST elevation (NSTEMI) myocardial infarction: Secondary | ICD-10-CM | POA: Diagnosis present

## 2018-06-16 DIAGNOSIS — Z90710 Acquired absence of both cervix and uterus: Secondary | ICD-10-CM | POA: Diagnosis not present

## 2018-06-16 LAB — BASIC METABOLIC PANEL
Anion gap: 14 (ref 5–15)
BUN: 15 mg/dL (ref 8–23)
CO2: 19 mmol/L — ABNORMAL LOW (ref 22–32)
Calcium: 8.8 mg/dL — ABNORMAL LOW (ref 8.9–10.3)
Chloride: 98 mmol/L (ref 98–111)
Creatinine, Ser: 0.94 mg/dL (ref 0.44–1.00)
GFR calc Af Amer: >60 mL/min (ref >60)
GFR calc non Af Amer: >60 mL/min (ref >60)
Glucose, Bld: 272 mg/dL — ABNORMAL HIGH (ref 70–99)
Potassium: 4.1 mmol/L (ref 3.5–5.1)
Sodium: 131 mmol/L — ABNORMAL LOW (ref 135–145)

## 2018-06-16 LAB — HEMOGLOBIN A1C
Hgb A1c MFr Bld: 8.3 % — ABNORMAL HIGH (ref 4.8–5.6)
Mean Plasma Glucose: 191.51 mg/dL

## 2018-06-16 LAB — PROTIME-INR
INR: 1 (ref 0.8–1.2)
Prothrombin Time: 13.1 seconds (ref 11.4–15.2)

## 2018-06-16 LAB — APTT: aPTT: 173 seconds (ref 24–36)

## 2018-06-16 LAB — CBC
HCT: 39.6 % (ref 36.0–46.0)
Hemoglobin: 13.7 g/dL (ref 12.0–15.0)
MCH: 32.6 pg (ref 26.0–34.0)
MCHC: 34.6 g/dL (ref 30.0–36.0)
MCV: 94.3 fL (ref 80.0–100.0)
Platelets: 245 10*3/uL (ref 150–400)
RBC: 4.2 MIL/uL (ref 3.87–5.11)
RDW: 12.4 % (ref 11.5–15.5)
WBC: 12.1 10*3/uL — ABNORMAL HIGH (ref 4.0–10.5)
nRBC: 0 % (ref 0.0–0.2)

## 2018-06-16 LAB — TROPONIN I
Troponin I: 0.74 ng/mL (ref <0.03)
Troponin I: 0.63 ng/mL (ref <0.03)
Troponin I: 0.64 ng/mL (ref <0.03)

## 2018-06-16 LAB — T4, FREE: Free T4: 1.21 ng/dL (ref 0.82–1.77)

## 2018-06-16 LAB — SARS CORONAVIRUS 2 BY RT PCR (HOSPITAL ORDER, PERFORMED IN ~~LOC~~ HOSPITAL LAB): SARS Coronavirus 2: NEGATIVE

## 2018-06-16 MED ORDER — INSULIN ASPART 100 UNIT/ML ~~LOC~~ SOLN
10.0000 [IU] | Freq: Every day | SUBCUTANEOUS | Status: DC
  Administered 2018-06-17 – 2018-06-21 (×4): 10 [IU] via SUBCUTANEOUS

## 2018-06-16 MED ORDER — ATORVASTATIN CALCIUM 40 MG PO TABS: 40.0000 mg | ORAL_TABLET | Freq: Every day | ORAL | Status: DC

## 2018-06-16 MED ORDER — METOPROLOL TARTRATE 12.5 MG HALF TABLET
12.5000 mg | ORAL_TABLET | Freq: Two times a day (BID) | ORAL | Status: DC
  Administered 2018-06-17 – 2018-06-19 (×7): 12.5 mg via ORAL
  Filled 2018-06-16 (×7): qty 1

## 2018-06-16 MED ORDER — NITROGLYCERIN 0.4 MG SL SUBL: 0.4000 mg | SUBLINGUAL_TABLET | SUBLINGUAL | Status: DC | PRN

## 2018-06-16 MED ORDER — ACETAMINOPHEN 325 MG PO TABS
650.0000 mg | ORAL_TABLET | ORAL | Status: DC | PRN
  Administered 2018-06-18: 650 mg via ORAL
  Filled 2018-06-16: qty 2

## 2018-06-16 MED ORDER — SODIUM CHLORIDE 0.9% FLUSH
3.0000 mL | Freq: Once | INTRAVENOUS | Status: AC
  Administered 2018-06-16: 3 mL via INTRAVENOUS

## 2018-06-16 MED ORDER — NICOTINE 14 MG/24HR TD PT24
14.0000 mg | MEDICATED_PATCH | Freq: Every day | TRANSDERMAL | Status: DC
  Administered 2018-06-17 – 2018-06-21 (×6): 14 mg via TRANSDERMAL
  Filled 2018-06-16 (×6): qty 1

## 2018-06-16 MED ORDER — FAMOTIDINE 20 MG PO TABS
10.0000 mg | ORAL_TABLET | Freq: Every day | ORAL | Status: DC
  Administered 2018-06-17 – 2018-06-21 (×5): 10 mg via ORAL
  Filled 2018-06-16 (×5): qty 1

## 2018-06-16 MED ORDER — INSULIN ASPART 100 UNIT/ML ~~LOC~~ SOLN
8.0000 [IU] | Freq: Every day | SUBCUTANEOUS | Status: DC
  Administered 2018-06-17 – 2018-06-21 (×4): 8 [IU] via SUBCUTANEOUS

## 2018-06-16 MED ORDER — ASPIRIN EC 81 MG PO TBEC
81.0000 mg | DELAYED_RELEASE_TABLET | Freq: Every day | ORAL | Status: DC
  Administered 2018-06-17 – 2018-06-21 (×4): 81 mg via ORAL
  Filled 2018-06-16 (×4): qty 1

## 2018-06-16 MED ORDER — INSULIN ASPART 100 UNIT/ML ~~LOC~~ SOLN
12.0000 [IU] | Freq: Every day | SUBCUTANEOUS | Status: DC
  Administered 2018-06-17 – 2018-06-20 (×4): 12 [IU] via SUBCUTANEOUS

## 2018-06-16 MED ORDER — INSULIN GLARGINE 100 UNIT/ML ~~LOC~~ SOLN
16.0000 [IU] | Freq: Every day | SUBCUTANEOUS | Status: DC
  Administered 2018-06-17: 16 [IU] via SUBCUTANEOUS
  Filled 2018-06-16 (×2): qty 0.16

## 2018-06-16 MED ORDER — HEPARIN (PORCINE) 25000 UT/250ML-% IV SOLN
1000.0000 [IU]/h | INTRAVENOUS | Status: DC
  Administered 2018-06-16: 900 [IU]/h via INTRAVENOUS
  Administered 2018-06-17 – 2018-06-20 (×3): 1000 [IU]/h via INTRAVENOUS
  Filled 2018-06-16 (×4): qty 250

## 2018-06-16 MED ORDER — INSULIN ASPART 100 UNIT/ML FLEXPEN: 8.0000 [IU] | PEN_INJECTOR | Freq: Three times a day (TID) | SUBCUTANEOUS | Status: DC

## 2018-06-16 MED ORDER — ASPIRIN 81 MG PO CHEW
324.0000 mg | CHEWABLE_TABLET | Freq: Once | ORAL | Status: AC
  Administered 2018-06-16: 324 mg via ORAL
  Filled 2018-06-16: qty 4

## 2018-06-16 MED ORDER — LISINOPRIL 2.5 MG PO TABS
2.5000 mg | ORAL_TABLET | Freq: Every day | ORAL | Status: DC
  Administered 2018-06-17 – 2018-06-19 (×3): 2.5 mg via ORAL
  Filled 2018-06-16 (×3): qty 1

## 2018-06-16 MED ORDER — HEPARIN BOLUS VIA INFUSION
4000.0000 [IU] | Freq: Once | INTRAVENOUS | Status: AC
  Administered 2018-06-16: 4000 [IU] via INTRAVENOUS
  Filled 2018-06-16: qty 4000

## 2018-06-16 MED ORDER — ATORVASTATIN CALCIUM 80 MG PO TABS
80.0000 mg | ORAL_TABLET | Freq: Every day | ORAL | Status: DC
  Administered 2018-06-17 – 2018-06-20 (×4): 80 mg via ORAL
  Filled 2018-06-16 (×4): qty 1

## 2018-06-16 MED ORDER — ONDANSETRON HCL 4 MG/2ML IJ SOLN: 4.0000 mg | Freq: Four times a day (QID) | INTRAMUSCULAR | Status: DC | PRN

## 2018-06-16 NOTE — H&P (Signed)
History & Physical    Patient ID: Sarah Francis MRN: [id number], DOB/AGE: 71/31/1947   Admit date: 06/16/2018   Primary Physician: Gweneth Dimitri, MD Primary Cardiologist: No primary care provider on file.  Patient Profile    Sarah Francis is a 73 year old woman with DMII, HTN, PAD, strong family history of CAD and ongoing tobacco abuse who presents with NSTEMI.   Past Medical History    Past Medical History:  Diagnosis Date  . Anxiety   . Aortic atherosclerosis (HCC)   . Coronary artery calcification seen on CAT scan 05/17/2016  . Diabetes mellitus without complication (HCC)   . GERD (gastroesophageal reflux disease)   . Hypertension   . Major depression    initial diagnosis/medication around age 69  . Osteoporosis    w h/o sacral fracture    Past Surgical History:  Procedure Laterality Date  . ABDOMINAL HYSTERECTOMY    . KNEE SURGERY Right 2012     Allergies  Allergies  Allergen Reactions  . Penicillins Rash    History of Present Illness    Sarah Francis is a 73 year old woman with DMII, HTN, PAD, strong family history of CAD and ongoing tobacco abuse who presents with NSTEMI.   The patient reports that for approximately the last week she has been extremely fatigued. She attributed this to poor sleep and did not note any other symptoms until the night prior to admission. She reports that at approximately 11pm she woke up with severe chest pressure that radiated to her left arm and shoulder blades. Pain lasted for approximately 2 hours and then resolved spontaneously. She has a pulse ox at home that she recently purchased and she checked her HR at the time which showed a pulse in the 170s, although she did not feel any palpitations. She went back to bed and called her PCP today who advised she present to the emergency department.   In the ED, the patient was hemodynamically stable and ECG without any acute ischemic changes. Laboratory evaluation revealed troponin 0.74 on  initial check. At the time of my evaluation, she denies any further episodes of chest pain. No recent chest pain on exertion at home but does note that she has recently started taking omeprazole due to chest discomfort that she attributed to indigestion. No shortness of breath, nausea, diaphoresis, syncope, palpitations or LEE. She continues to smoke daily.   Home Medications    Prior to Admission medications   Medication Sig Start Date End Date Taking? Authorizing Provider  aspirin EC 81 MG tablet Take 81 mg by mouth daily.   Yes [provider]  atorvastatin (LIPITOR) 40 MG tablet Take 40 mg by mouth daily.   Yes [provider]  cetirizine (ZYRTEC) 10 MG tablet Take 10 mg by mouth every morning.   Yes [provider]  cholecalciferol (VITAMIN D3) 25 MCG (1000 UT) tablet Take 2,000 Units by mouth daily.   Yes [provider]  cycloSPORINE (RESTASIS) 0.05 % ophthalmic emulsion Place 1 drop into both eyes 2 (two) times daily.   Yes [provider]  HYDROcodone-acetaminophen (NORCO/VICODIN) 5-325 MG tablet Take 1-2 tablets by mouth every 6 (six) hours as needed for moderate pain. 01/10/16  Yes Vanetta Mulders, MD  hydrOXYzine (ATARAX/VISTARIL) 25 MG tablet Take 1 tablet (25 mg total) by mouth every 6 (six) hours as needed for itching. 06/21/17  Yes Horton, Mayer Masker, MD  Insulin Glargine (LANTUS SOLOSTAR) 100 UNIT/ML Solostar Pen Inject 15-30 Units  into the skin 2 (two) times daily. 15 units in the morning and 30 units in the evening   Yes [provider]  Insulin Lispro (HUMALOG KWIKPEN [place]) Inject 8-12 Units into the skin 3 (three) times daily. 8 units with breakfast, 10 units with lunch and 12 units with dinner   Yes [provider]  lisinopril (PRINIVIL,ZESTRIL) 2.5 MG tablet Take 2.5 mg by mouth daily.   Yes [provider]  omeprazole (PRILOSEC) 20 MG capsule Take 20 mg by mouth daily.   Yes [provider]   ranitidine (ZANTAC) 150 MG tablet Take 150 mg by mouth 2 (two) times daily as needed for heartburn.   Yes [provider]    Family History    Family History  Problem Relation Age of Onset  . Heart attack Mother   . Heart disease Mother   . AAA (abdominal aortic aneurysm) Father   . Cancer Brother   . Hypertension Sister   . Diabetes Brother    She indicated that her mother is deceased. She indicated that her father is deceased. She indicated that her sister is alive. She indicated that only one of her two brothers is alive. She indicated that her maternal grandmother is deceased. She indicated that her maternal grandfather is deceased. She indicated that her paternal grandmother is deceased. She indicated that her paternal grandfather is deceased.   Social History    Social History   Socioeconomic History  . Marital status: Single    Spouse name: Not on file  . Number of children: Not on file  . Years of education: Not on file  . Highest education level: Not on file  Occupational History  . Not on file  Social Needs  . Financial resource strain: Not on file  . Food insecurity:    Worry: Not on file    Inability: Not on file  . Transportation needs:    Medical: Not on file    Non-medical: Not on file  Tobacco Use  . Smoking status: Current Every Day Smoker    Packs/day: 1.00    Years: 88.00    Pack years: 88.00  . Smokeless tobacco: Never Used  . Tobacco comment: Currently smoker 1ppd  Substance and Sexual Activity  . Alcohol use: Yes    Comment: 1-2 glasses of Wine per day  . Drug use: No  . Sexual activity: Not on file  Lifestyle  . Physical activity:    Days per week: Not on file    Minutes per session: Not on file  . Stress: Not on file  Relationships  . Social connections:    Talks on phone: Not on file    Gets together: Not on file    Attends religious service: Not on file    Active member of club or organization: Not on file    Attends  meetings of clubs or organizations: Not on file    Relationship status: Not on file  . Intimate partner violence:    Fear of current or ex partner: Not on file    Emotionally abused: Not on file    Physically abused: Not on file    Forced sexual activity: Not on file  Other Topics Concern  . Not on file  Social History Narrative  . Not on file     Review of Systems    General:  No chills, fever, night sweats or weight changes.  Cardiovascular:  As per HPI Dermatological: No rash, lesions/masses  Respiratory: No cough, dyspnea Urologic: No hematuria, dysuria Abdominal:   No nausea, vomiting, diarrhea, bright red blood per rectum, melena, or hematemesis Neurologic:  No visual changes, wkns, changes in mental status. All other systems reviewed and are otherwise negative except as noted above.  Physical Exam    Blood pressure 136/71, pulse 100, temperature 97.6 F (36.4 C), temperature source Axillary, resp. rate 18, height  (1.549 m), weight 72.6 kg, SpO2 98 %.  General: Pleasant, NAD Psych: Normal affect. Neuro: Alert and oriented X 3. Moves all extremities spontaneously. HEENT: Normal  Neck: Supple without bruits or JVD. Lungs:  Resp regular and unlabored, CTA. Heart: RRR no s3, s4. II/VI systolic murmur. No rubs or gallops. Abdomen: Soft, non-tender, non-distended, BS + x 4.  Extremities: No clubbing, cyanosis or edema. DP/PT/Radials 2+ and equal bilaterally.  Labs    Troponin (Point of Care Test) No results for input(s): TROPIPOC in the last 72 hours. Recent Labs    06/16/18 1723 06/16/18 2025  TROPONINI 0.74* 0.63*   Lab Results  Component Value Date   WBC 12.1 (H) 06/16/2018   HGB 13.7 06/16/2018   HCT 39.6 06/16/2018   MCV 94.3 06/16/2018   PLT 245 06/16/2018    Recent Labs  Lab 06/16/18 1723  NA 131*  K 4.1  CL 98  CO2 19*  BUN 15  CREATININE 0.94  CALCIUM 8.8*  GLUCOSE 272*   No results found for: CHOL, HDL, LDLCALC, TRIG No results found  for: [facility]   Radiology Studies    Dg Chest 2 View  Result Date: 06/16/2018 CLINICAL DATA:  Chest discomfort and shortness of breath EXAM: CHEST - 2 VIEW COMPARISON:  Chest radiograph November 02, 2013 and chest CT March 21, 2017 FINDINGS: There is atelectatic change in the left base. There is no edema or consolidation. Heart size and pulmonary vascularity are normal. No adenopathy. There is aortic atherosclerosis. No bone lesions. IMPRESSION: Left base atelectasis. No edema or consolidation. Heart size within normal limits. Aortic Atherosclerosis (ICD10-I70.0). Electronically Signed   By: Bretta Bang III M.D.   On: 06/16/2018 18:08    ECG & Cardiac Imaging    ECG 06/16/18 - personally reviewed. Sinus tachycardia. Non-specific TWI laterally.   Assessment & Plan    Sarah Francis is a 73 year old woman with DMII, HTN, PAD, strong family history of CAD and ongoing tobacco abuse who presents with NSTEMI.   # NSTEMI Patient with known coronary calcification but has not had prior cath. Concerning episode of chest pain the night prior to admission. Troponin now down trending at 0.74 --> 0.66. Given tachycardia on home pulse ox, is possible that this is a type II event in the setting of a new arrhythmia but certainly has multiple risk factors for type I event as well. - Cont to trend troponin and ECGs - Chest pain free currently - SLNG prn - ASA  daily; received  in the ED - Metoprolol 12.5mg  BID - atorvastatin  QHS - heparin gtt - TTE in the AM - A1C, lipid panel, TFTs pending - Likely cath on Tuesday  # HTN BP mildly elevated on admission - Initiate metoprolol as above - Cont home lisinopril 2.5mg  daily, can likely titrate if Bps remain elevated  # PAD No claudication per patient - ASA, statin as above  # DMII - A1C pending - Cont home insulin, will decrease lantus coverage given inpatient status  - insulin sliding scale, accuchecks  # Tobacco abuse -  Encourage  tobacco cessation - Nicotine patch  # GERD - cont PPI  # FULL CODE  Signed, Starlyn SkeansAngela Likisha Alles, MD 06/16/2018, 11:43 PM

## 2018-06-16 NOTE — ED Provider Notes (Signed)
[facility] EMERGENCY DEPARTMENT Provider Note   CSN: [id number] Arrival date & time: 06/16/18  1708    History   Chief Complaint Chief Complaint  Patient presents with  . Chest Pain    HPI Briahna Mahdavi is a 73 y.o. female.  HPI 73 year old female with a history of diabetes, hypertension, and osteoporosis presents with chest pain.  Patient states that she had episode of severe chest pressure last night that woke her up from sleep.  Pain lasted for approximately 2 hours and then resolved.  She does report that she took her heart rate at that time and she was in the 170s.  She saw her PCP this morning who recommended that she be evaluated in the emergency department.  Patient denies chest pain or shortness of breath at this time.  No lower extremity swelling or calf tenderness.  Past Medical History:  Diagnosis Date  . Anxiety   . Aortic atherosclerosis (HCC)   . Coronary artery calcification seen on CAT scan 05/17/2016  . Diabetes mellitus without complication (HCC)   . GERD (gastroesophageal reflux disease)   . Hypertension   . Major depression    initial diagnosis/medication around age 74  . Osteoporosis    w h/o sacral fracture    Patient Active Problem List   Diagnosis Date Noted  . Peripheral arterial disease (HCC) 06/25/2016  . Aortic atherosclerosis (HCC)   . Coronary artery calcification seen on CAT scan 05/17/2016  . Claudication (HCC) 05/17/2016  . Heart murmur 05/17/2016  . Chest pain 12/25/2012  . Hypertension   . 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  . Polyneuropathy in diabetes(357.2) 12/22/2012  . Plantar fasciitis, bilateral 12/22/2012  . Mixed hyperlipidemia 12/22/2012  . Osteoporosis, unspecified 12/22/2012  . Vitamin D deficiency 12/22/2012  . Restless legs syndrome (RLS) 12/22/2012  . Anxiety 12/22/2012  . Sleep disturbance 12/22/2012  . Tobacco use disorder 12/22/2012  . Alcohol dependence (HCC) 12/22/2012     Past Surgical History:  Procedure Laterality Date  . ABDOMINAL HYSTERECTOMY    . KNEE SURGERY Right 2012     OB History   No obstetric history on file.      Home Medications    Prior to Admission medications   Medication Sig Start Date End Date Taking? Authorizing Provider  atorvastatin (LIPITOR) 40 MG tablet Take 40 mg by mouth daily.    [provider]  doxycycline (VIBRAMYCIN) 100 MG capsule Take 1 capsule (100 mg total) by mouth 2 (two) times daily. 06/21/17   Horton, Mayer Masker, MD  HYDROcodone-acetaminophen (NORCO/VICODIN) 5-325 MG tablet Take 1-2 tablets by mouth every 6 (six) hours as needed for moderate pain. 01/10/16   Vanetta Mulders, MD  hydrOXYzine (ATARAX/VISTARIL) 25 MG tablet Take 1 tablet (25 mg total) by mouth every 6 (six) hours as needed for itching. 06/21/17   Horton, Mayer Masker, MD  insulin aspart (NOVOLOG FLEXPEN) 100 UNIT/ML SOPN FlexPen Inject 8-12 Units into the skin 3 (three) times daily with meals. Take 8 units in the morning 10 units before lunch and 12 units before dinner    [provider]  Insulin Glargine (LANTUS SOLOSTAR) 100 UNIT/ML SOPN Inject 32 Units into the skin at bedtime.    [provider]  Insulin Lispro (HUMALOG KWIKPEN ) Inject into the skin.    [provider]  lisinopril (PRINIVIL,ZESTRIL) 2.5 MG tablet Take 2.5 mg by mouth daily.    [provider]  ranitidine (ZANTAC) 150 MG tablet Take 150  mg by mouth 2 (two) times daily as needed for heartburn.    [provider]  Vitamin D, Ergocalciferol, (DRISDOL) 50000 UNITS CAPS capsule Take 50,000 Units by mouth every 7 (seven) days.    [provider]    Family History Family History  Problem Relation Age of Onset  . Heart attack Mother   . Heart disease Mother   . AAA (abdominal aortic aneurysm) Father   . Cancer Brother   . Hypertension Sister   . Diabetes Brother     Social History Social History   Tobacco Use   . Smoking status: Current Every Day Smoker    Packs/day: 1.00    Years: 88.00    Pack years: 88.00  . Smokeless tobacco: Never Used  . Tobacco comment: Currently smoker 1ppd  Substance Use Topics  . Alcohol use: Yes    Comment: 1-2 glasses of Wine per day  . Drug use: No     Allergies   Penicillins   Review of Systems Review of Systems  Constitutional: Negative for chills and fever.  HENT: Negative for ear pain and sore throat.   Eyes: Negative for pain and visual disturbance.  Respiratory: Negative for cough and shortness of breath.   Cardiovascular: Positive for chest pain. Negative for palpitations.  Gastrointestinal: Negative for abdominal pain and vomiting.  Genitourinary: Negative for dysuria and hematuria.  Musculoskeletal: Negative for arthralgias and back pain.  Skin: Negative for color change and rash.  Neurological: Negative for seizures and syncope.  All other systems reviewed and are negative.    Physical Exam Updated Vital Signs BP 136/72   Pulse (!) 106   Temp 99.5 F (37.5 C) (Oral)   Resp 15   Ht 5\' 1"  (1.549 m)   Wt 72.6 kg   SpO2 96%   BMI 30.23 kg/m   Physical Exam Vitals signs and nursing note reviewed.  Constitutional:      General: She is not in acute distress.    Appearance: She is well-developed.  HENT:     Head: Normocephalic and atraumatic.  Eyes:     Conjunctiva/sclera: Conjunctivae normal.  Neck:     Musculoskeletal: Neck supple.  Cardiovascular:     Rate and Rhythm: Normal rate and regular rhythm.     Heart sounds: No murmur.  Pulmonary:     Effort: Pulmonary effort is normal. No respiratory distress.     Breath sounds: Normal breath sounds.  Abdominal:     Palpations: Abdomen is soft.     Tenderness: There is no abdominal tenderness.  Skin:    General: Skin is warm and dry.  Neurological:     General: No focal deficit present.     Mental Status: She is alert and oriented to person, place, and time.      ED  Treatments / Results  Labs (all labs ordered are listed, but only abnormal results are displayed) Labs Reviewed  BASIC METABOLIC PANEL - Abnormal; Notable for the following components:      Result Value   Sodium 131 (*)    CO2 19 (*)    Glucose, Bld 272 (*)    Calcium 8.8 (*)    All other components within normal limits  CBC - Abnormal; Notable for the following components:   WBC 12.1 (*)    All other components within normal limits  TROPONIN I - Abnormal; Notable for the following components:   Troponin I 0.74 (*)    All other components within  normal limits  TROPONIN I  HEPARIN LEVEL (UNFRACTIONATED)  CBC    EKG EKG Interpretation  Date/Time:  Friday Jun 16 2018 20:35:12 EDT Ventricular Rate:  103 PR Interval:    QRS Duration: 79 QT Interval:  331 QTC Calculation: 434 R Axis:   58 Text Interpretation:  Sinus tachycardia Nonspecific repol abnormality, lateral leads No significant change since last tracing Confirmed by Melene Plan [phone]) on 06/16/2018 9:07:32 PM   Radiology Dg Chest 2 View  Result Date: 06/16/2018 CLINICAL DATA:  Chest discomfort and shortness of breath EXAM: CHEST - 2 VIEW COMPARISON:  Chest radiograph November 02, 2013 and chest CT March 21, 2017 FINDINGS: There is atelectatic change in the left base. There is no edema or consolidation. Heart size and pulmonary vascularity are normal. No adenopathy. There is aortic atherosclerosis. No bone lesions. IMPRESSION: Left base atelectasis. No edema or consolidation. Heart size within normal limits. Aortic Atherosclerosis (ICD10-I70.0). Electronically Signed   By: Bretta Bang III M.D.   On: 06/16/2018 18:08    Procedures Procedures (including critical care time)  Medications Ordered in ED Medications  sodium chloride flush (NS) 0.9 % injection 3 mL (has no administration in time range)  aspirin chewable tablet 324 mg (has no administration in time range)  heparin bolus via infusion 4,000 Units (has no  administration in time range)  heparin ADULT infusion 100 units/mL (25000 units/271mL sodium chloride 0.45%) (has no administration in time range)     Initial Impression / Assessment and Plan / ED Course  I have reviewed the triage vital signs and the nursing notes.  Pertinent labs & imaging results that were available during my care of the patient were reviewed by me and considered in my medical decision making (see chart for details).  73 year old female with a history of diabetes, hypertension, and osteoporosis presents with chest pain.  Patient is hemodynamically stable.  Afebrile.  No chest pain or shortness of breath at this time.  History concerning for ACS.  Considered PE less likely as she is not hypoxic, short of breath, and no signs of DVT.  EKG sinus tachycardia with nonspecific ST and T wave changes.  Rate 116.  Troponin 0.74.  Aspirin given.  Heparin started.  Cardiology consulted and patient will be admitted for further management.  Final Clinical Impressions(s) / ED Diagnoses   Final diagnoses:  NSTEMI (non-ST elevated myocardial infarction) [facility])    ED Discharge Orders    None       Vallery Ridge, MD 06/16/18 2111    Melene Plan, DO 06/16/18 2115

## 2018-06-16 NOTE — ED Triage Notes (Signed)
Pt sent here by pcp for st changes today.  States L sided chest discomfort and sob last night, but none presently.

## 2018-06-16 NOTE — Progress Notes (Signed)
ANTICOAGULATION CONSULT NOTE - Initial Consult  Pharmacy Consult for heparin Indication: chest pain/ACS   Patient Measurements: Height: 5\' 1"  (154.9 cm) Weight: 160 lb (72.6 kg) IBW/kg (Calculated) : 47.8 Heparin Dosing Weight: 63.6 kg  Vital Signs: Temp: 99.5 F (37.5 C) (05/22 1714) Temp Source: Oral (05/22 1714) BP: 142/79 (05/22 1717) Pulse Rate: 120 (05/22 1714)  Labs: Recent Labs    06/16/18 1723  HGB 13.7  HCT 39.6  PLT 245  CREATININE 0.94  TROPONINI 0.74*     Assessment: Admitted with chest pain. Starting heparin drip for rule out ACS.  No anticoagulation prior to admission. H/h plts wnl. SCr 0.9. Trop positive.   Goal of Therapy:  Heparin level 0.3-0.7 units/ml Monitor platelets by anticoagulation protocol: Yes    Plan:  -heparin bolus 4000 units x1 then 900 units/hr -daily HL, CBC -first level with AM labs   Baldemar Friday 06/16/2018,8:25 PM

## 2018-06-16 NOTE — ED Notes (Signed)
Pt outside of front doors.

## 2018-06-16 NOTE — ED Notes (Addendum)
ED TO INPATIENT HANDOFF REPORT  ED Nurse Name and Phone #: Marcelina MorelShawnette [phone]  S Name/Age/Gender Sarah Francis 73 y.o. female Room/Bed: 019C/019C  Code Status   Code Status: Not on file  Home/SNF/Other Home Patient oriented to: self, place, time and situation Is this baseline? Yes   Triage Complete: Triage complete  Chief Complaint Irregular EKG  Triage Note Pt sent here by pcp for st changes today.  States L sided chest discomfort and sob last night, but none presently.  Pt reported chest pressure last night to L side of chest radiating to l arm and L scapula, lasted a few hours and resolved. Pt reports since she has had no chest pain.   Allergies Allergies  Allergen Reactions  . Penicillins Rash    Level of Care/Admitting Diagnosis ED Disposition    ED Disposition Condition Comment   Admit  [facility] [[id number][facility]  Level of Care: Telemetry Medical [104]  Covid Evaluation: N/A  Diagnosis: NSTEMI (non-ST elevated myocardial infarction) [facility] [place] [facility](HCC) [[id number]]  Admitting Physician: Starlyn SkeansLOWENSTERN, ANGELA [phone][[id number]]  Attending Physician: Lars MassonNELSON, KATARINA H [[id number]]  Estimated length of stay: past midnight tomorrow  Certification:: I certify this patient will need inpatient services for at least 2 midnights  PT Class (Do Not Modify): Inpatient [101]  PT Acc Code (Do Not Modify): Private [1]       B Medical/Surgery History Past Medical History:  Diagnosis Date  . Anxiety   . Aortic atherosclerosis (HCC)   . Coronary artery calcification seen on CAT scan 05/17/2016  . Diabetes mellitus without complication (HCC)   . GERD (gastroesophageal reflux disease)   . Hypertension   . Major depression    initial diagnosis/medication around age 73  . Osteoporosis    w h/o sacral fracture   Past Surgical History:  Procedure Laterality Date  . ABDOMINAL HYSTERECTOMY    . KNEE SURGERY Right 2012     A IV Location/Drains/Wounds Patient  Lines/Drains/Airways Status   Active Line/Drains/Airways    Name:   Placement date:   Placement time:   Site:   Days:   Peripheral IV 06/16/18 Right Forearm   06/16/18    2148    Forearm   less than 1          Intake/Output Last 24 hours No intake or output data in the 24 hours ending 06/16/18 2223  Labs/Imaging Results for orders placed or performed during the hospital encounter of 06/16/18 (from the past 48 hour(s))  Basic metabolic panel     Status: Abnormal   Collection Time: 06/16/18  5:23 PM  Result Value Ref Range   Sodium 131 (L) 135 - 145 mmol/L   Potassium 4.1 3.5 - 5.1 mmol/L   Chloride 98 98 - 111 mmol/L   CO2 19 (L) 22 - 32 mmol/L   Glucose, Bld 272 (H) 70 - 99 mg/dL   BUN 15 8 - 23 mg/dL   Creatinine, Ser 0.860.94 0.44 - 1.00 mg/dL   Calcium 8.8 (L) 8.9 - 10.3 mg/dL   GFR calc non Af Amer >60 >60 mL/min   GFR calc Af Amer >60 >60 mL/min   Anion gap 14 5 - 15    Comment: Performed at [facility], [address] [place], [place] [id number]  CBC     Status: Abnormal   Collection Time: 06/16/18  5:23 PM  Result Value Ref Range   WBC 12.1 (H) 4.0 - 10.5 K/uL   RBC 4.20 3.87 -  5.11 MIL/uL   Hemoglobin 13.7 12.0 - 15.0 g/dL   HCT 17.5 10.2 - 58.5 %   MCV 94.3 80.0 - 100.0 fL   MCH 32.6 26.0 - 34.0 pg   MCHC 34.6 30.0 - 36.0 g/dL   RDW 27.7 82.4 - 23.5 %   Platelets 245 150 - 400 K/uL   nRBC 0.0 0.0 - 0.2 %    Comment: Performed at [facility], [address] [place], [place] [address]  Troponin I - ONCE - STAT     Status: Abnormal   Collection Time: 06/16/18  5:23 PM  Result Value Ref Range   Troponin I 0.74 (HH) <0.03 ng/mL    Comment: CRITICAL RESULT CALLED TO, READ BACK BY AND VERIFIED WITH: A REABOLD,RN 1820 06/16/2018 WBOND Performed at [facility], [address], [place], [place] [address]   Troponin I - ONCE - STAT     Status: Abnormal   Collection Time: 06/16/18  8:25 PM  Result Value Ref Range   Troponin I 0.63 (HH) <0.03 ng/mL     Comment: CRITICAL VALUE NOTED.  VALUE IS CONSISTENT WITH PREVIOUSLY REPORTED AND CALLED VALUE. Performed at [facility], [address], [place], [place] [address]    Dg Chest 2 View  Result Date: 06/16/2018 CLINICAL DATA:  Chest discomfort and shortness of breath EXAM: CHEST - 2 VIEW COMPARISON:  Chest radiograph November 02, 2013 and chest CT March 21, 2017 FINDINGS: There is atelectatic change in the left base. There is no edema or consolidation. Heart size and pulmonary vascularity are normal. No adenopathy. There is aortic atherosclerosis. No bone lesions. IMPRESSION: Left base atelectasis. No edema or consolidation. Heart size within normal limits. Aortic Atherosclerosis (ICD10-I70.0). Electronically Signed   By: Bretta Bang III M.D.   On: 06/16/2018 18:08    Pending Labs Unresulted Labs (From admission, onward)    Start     Ordered   06/17/18 0500  Heparin level (unfractionated)  Daily,   R     06/16/18 2039   06/17/18 0500  CBC  Daily,   R     06/16/18 2039   Signed and Held  TSH  Once,   R     Signed and Held   [address] and Held  T4, free  Once,   R     Signed and Held   Signed and Held  Troponin I - Now Then [employer] [employer]  Now then every 6 hours,   STAT     Signed and Held   Signed and Held  Hemoglobin A1c  Once,   R     Signed and Held   Signed and Held  Protime-INR  ONCE - STAT,   STAT     Signed and Held   Signed and Held  APTT  ONCE - STAT,   STAT     Signed and Held   Signed and Armed forces training and education officer morning,   R     Signed and Held   Signed and Held  Lipid panel  Tomorrow morning,   R     Signed and Held   Signed and Held  CBC  Tomorrow morning,   R     Signed and Held          Vitals/Pain Today's Vitals   06/16/18 1717 06/16/18 2045 06/16/18 2048 06/16/18 2221  BP: (!) 142/79 136/72 136/72 (!) 153/87  Pulse:   (!) 106 (!) 111  Resp:  (!) 25 15 (!) 24  Temp:      TempSrc:      SpO2:   96% 96%  Weight: 72.6 kg     Height:   (1.549 m)     PainSc: 0-No pain  0-No pain     Isolation Precautions No active isolations  Medications Medications  heparin ADULT infusion 100 units/mL (25000 units/225mL sodium chloride 0.45%) (900 Units/hr Intravenous New Bag/Given 06/16/18 2139)  sodium chloride flush (NS) 0.9 % injection 3 mL (3 mLs Intravenous Given 06/16/18 2149)  aspirin chewable tablet 324 mg (324 mg Oral Given 06/16/18 2137)  heparin bolus via infusion 4,000 Units (4,000 Units Intravenous Bolus from Bag 06/16/18 2139)    Mobility walks Low fall risk   Focused Assessments Cardiac Assessment Handoff:  Cardiac Rhythm: Sinus tachycardia(low 100s) Lab Results  Component Value Date   TROPONINI 0.63 (HH) 06/16/2018   No results found for: DDIMER Does the Patient currently have chest pain? No     R Recommendations: See Admitting Provider Note  Report given to:   Additional Notes:

## 2018-06-16 NOTE — ED Notes (Signed)
Sitting outside

## 2018-06-17 ENCOUNTER — Inpatient Hospital Stay (HOSPITAL_COMMUNITY): Payer: Medicare Other

## 2018-06-17 DIAGNOSIS — I214 Non-ST elevation (NSTEMI) myocardial infarction: Secondary | ICD-10-CM

## 2018-06-17 LAB — CBC
HCT: 36.4 % (ref 36.0–46.0)
Hemoglobin: 12.7 g/dL (ref 12.0–15.0)
MCH: 32.6 pg (ref 26.0–34.0)
MCHC: 34.9 g/dL (ref 30.0–36.0)
MCV: 93.3 fL (ref 80.0–100.0)
Platelets: 214 10*3/uL (ref 150–400)
RBC: 3.9 MIL/uL (ref 3.87–5.11)
RDW: 12.2 % (ref 11.5–15.5)
WBC: 8.2 10*3/uL (ref 4.0–10.5)
nRBC: 0 % (ref 0.0–0.2)

## 2018-06-17 LAB — BASIC METABOLIC PANEL
Anion gap: 11 (ref 5–15)
BUN: 16 mg/dL (ref 8–23)
CO2: 21 mmol/L — ABNORMAL LOW (ref 22–32)
Calcium: 8.7 mg/dL — ABNORMAL LOW (ref 8.9–10.3)
Chloride: 102 mmol/L (ref 98–111)
Creatinine, Ser: 0.93 mg/dL (ref 0.44–1.00)
GFR calc Af Amer: >60 mL/min (ref >60)
GFR calc non Af Amer: >60 mL/min (ref >60)
Glucose, Bld: 279 mg/dL — ABNORMAL HIGH (ref 70–99)
Potassium: 4.3 mmol/L (ref 3.5–5.1)
Sodium: 134 mmol/L — ABNORMAL LOW (ref 135–145)

## 2018-06-17 LAB — LIPID PANEL
Cholesterol: 137 mg/dL (ref 0–200)
HDL: 35 mg/dL — ABNORMAL LOW (ref >40)
LDL Cholesterol: 60 mg/dL (ref 0–99)
Total CHOL/HDL Ratio: 3.9 RATIO
Triglycerides: 211 mg/dL — ABNORMAL HIGH (ref <150)
VLDL: 42 mg/dL — ABNORMAL HIGH (ref 0–40)

## 2018-06-17 LAB — GLUCOSE, CAPILLARY
Glucose-Capillary: 197 mg/dL — ABNORMAL HIGH (ref 70–99)
Glucose-Capillary: 214 mg/dL — ABNORMAL HIGH (ref 70–99)
Glucose-Capillary: 283 mg/dL — ABNORMAL HIGH (ref 70–99)
Glucose-Capillary: 305 mg/dL — ABNORMAL HIGH (ref 70–99)
Glucose-Capillary: 329 mg/dL — ABNORMAL HIGH (ref 70–99)
Glucose-Capillary: 345 mg/dL — ABNORMAL HIGH (ref 70–99)

## 2018-06-17 LAB — ECHOCARDIOGRAM COMPLETE
Height: 61 in
Weight: 2561.6 oz

## 2018-06-17 LAB — HEPARIN LEVEL (UNFRACTIONATED)
Heparin Unfractionated: <0.1 IU/mL — ABNORMAL LOW (ref 0.30–0.70)
Heparin Unfractionated: 0.27 IU/mL — ABNORMAL LOW (ref 0.30–0.70)
Heparin Unfractionated: 0.47 IU/mL (ref 0.30–0.70)

## 2018-06-17 LAB — TROPONIN I
Troponin I: 0.59 ng/mL (ref <0.03)
Troponin I: 0.41 ng/mL (ref <0.03)

## 2018-06-17 LAB — TSH: TSH: 1.288 u[IU]/mL (ref 0.350–4.500)

## 2018-06-17 MED ORDER — PERFLUTREN LIPID MICROSPHERE
INTRAVENOUS | Status: AC
  Filled 2018-06-17: qty 10

## 2018-06-17 MED ORDER — PERFLUTREN LIPID MICROSPHERE
1.0000 mL | INTRAVENOUS | Status: AC | PRN
  Filled 2018-06-17: qty 10

## 2018-06-17 MED ORDER — INSULIN ASPART 100 UNIT/ML ~~LOC~~ SOLN
0.0000 [IU] | Freq: Every day | SUBCUTANEOUS | Status: DC
  Administered 2018-06-17: 3 [IU] via SUBCUTANEOUS
  Administered 2018-06-18 – 2018-06-20 (×3): 2 [IU] via SUBCUTANEOUS

## 2018-06-17 MED ORDER — INSULIN GLARGINE 100 UNIT/ML ~~LOC~~ SOLN
15.0000 [IU] | Freq: Two times a day (BID) | SUBCUTANEOUS | Status: DC
  Administered 2018-06-17 – 2018-06-18 (×2): 15 [IU] via SUBCUTANEOUS
  Filled 2018-06-17 (×3): qty 0.15

## 2018-06-17 MED ORDER — INSULIN ASPART 100 UNIT/ML ~~LOC~~ SOLN
0.0000 [IU] | Freq: Three times a day (TID) | SUBCUTANEOUS | Status: DC
  Administered 2018-06-17: 2 [IU] via SUBCUTANEOUS
  Administered 2018-06-18: 9 [IU] via SUBCUTANEOUS
  Administered 2018-06-18: 2 [IU] via SUBCUTANEOUS
  Administered 2018-06-18: 3 [IU] via SUBCUTANEOUS
  Administered 2018-06-19: 7 [IU] via SUBCUTANEOUS
  Administered 2018-06-19: 5 [IU] via SUBCUTANEOUS
  Administered 2018-06-19: 7 [IU] via SUBCUTANEOUS
  Administered 2018-06-20 (×3): 2 [IU] via SUBCUTANEOUS
  Administered 2018-06-21: 3 [IU] via SUBCUTANEOUS
  Administered 2018-06-21: 5 [IU] via SUBCUTANEOUS

## 2018-06-17 MED ORDER — DIPHENHYDRAMINE HCL 25 MG PO CAPS
25.0000 mg | ORAL_CAPSULE | Freq: Three times a day (TID) | ORAL | Status: DC | PRN
  Administered 2018-06-21: 25 mg via ORAL
  Filled 2018-06-17 (×2): qty 1

## 2018-06-17 NOTE — Progress Notes (Signed)
Progress Note  Patient Name: Paul HalfJoan Ewbank Date of Encounter: 06/17/2018  Primary Cardiologist: New to [facility]   Subjective   73 year old female with a history of type 2 diabetes mellitus, hypertension, peripheral arterial disease and cigarette smoking.  She is admitted with a non-ST segment elevation myocardial infarction.  Inpatient Medications    Scheduled Meds: . aspirin EC  81 mg Oral Daily  . atorvastatin  80 mg Oral q1800  . famotidine  10 mg Oral Daily  . insulin aspart  8 Units Subcutaneous Q breakfast   And  . insulin aspart  10 Units Subcutaneous Q lunch   And  . insulin aspart  12 Units Subcutaneous Q supper  . insulin glargine  16 Units Subcutaneous QHS  . lisinopril  2.5 mg Oral Daily  . metoprolol tartrate  12.5 mg Oral BID  . nicotine  14 mg Transdermal Daily   Continuous Infusions: . heparin Stopped (06/17/18 0043)   PRN Meds: acetaminophen, nitroGLYCERIN, ondansetron (ZOFRAN) IV   Vital Signs    Vitals:   06/16/18 2221 06/16/18 2326 06/17/18 0415 06/17/18 0938  BP: (!) 153/87 136/71 123/75 (!) 149/80  Pulse: (!) 111 100 85   Resp: (!) 24 18 18    Temp:  97.6 F (36.4 C) 98.2 F (36.8 C)   TempSrc:  Axillary Oral   SpO2: 96% 98% 97%   Weight:  72.6 kg    Height:  5\' 1"  (1.549 m)      Intake/Output Summary (Last 24 hours) at 06/17/2018 1017 Last data filed at 06/17/2018 0043 Gross per 24 hour  Intake 67.18 ml  Output -  Net 67.18 ml   Last 3 Weights 06/16/2018 06/16/2018 06/21/2017  Weight (lbs) 160 lb 1.6 oz 160 lb 150 lb  Weight (kg) 72.621 kg 72.576 kg 68.04 kg      Telemetry    NSR  - Personally Reviewed  ECG     NSR  - Personally Reviewed  Physical Exam   GEN:  elderly female,  NAD ,  Neck: No JVD Cardiac: RRR,  2/6 systolic murmur  Respiratory:  bilateral rhonchi  GI: Soft, nontender, non-distended  MS: No edema; No deformity. Neuro:  Nonfocal  Psych: Normal affect   Labs    Chemistry Recent Labs  Lab 06/16/18 1723  06/17/18 0357  NA 131* 134*  K 4.1 4.3  CL 98 102  CO2 19* 21*  GLUCOSE 272* 279*  BUN 15 16  CREATININE 0.94 0.93  CALCIUM 8.8* 8.7*  GFRNONAA >60 >60  GFRAA >60 >60  ANIONGAP 14 11     Hematology Recent Labs  Lab 06/16/18 1723 06/17/18 0357  WBC 12.1* 8.2  RBC 4.20 3.90  HGB 13.7 12.7  HCT 39.6 36.4  MCV 94.3 93.3  MCH 32.6 32.6  MCHC 34.6 34.9  RDW 12.4 12.2  PLT 245 214    Cardiac Enzymes Recent Labs  Lab 06/16/18 1723 06/16/18 2025 06/16/18 2253 06/17/18 0357  TROPONINI 0.74* 0.63* 0.64* 0.59*   No results for input(s): TROPIPOC in the last 168 hours.   BNPNo results for input(s): BNP, PROBNP in the last 168 hours.   DDimer No results for input(s): DDIMER in the last 168 hours.   Radiology    Dg Chest 2 View  Result Date: 06/16/2018 CLINICAL DATA:  Chest discomfort and shortness of breath EXAM: CHEST - 2 VIEW COMPARISON:  Chest radiograph November 02, 2013 and chest CT March 21, 2017 FINDINGS: There is atelectatic change in the left  base. There is no edema or consolidation. Heart size and pulmonary vascularity are normal. No adenopathy. There is aortic atherosclerosis. No bone lesions. IMPRESSION: Left base atelectasis. No edema or consolidation. Heart size within normal limits. Aortic Atherosclerosis (ICD10-I70.0). Electronically Signed   By: Bretta Bang III M.D.   On: 06/16/2018 18:08    Cardiac Studies     Patient Profile     73 y.o. female  With DM, PAD admitted with NSTEMI   Assessment & Plan    1.   CAD:   admited with NSTEMI.  Pain started in the middle of the night.   Better now on heparin.  She want to go home but Ive recommended that she stay for cath on Tuesday   2.  DM:  Will get a diabetic coordinator to help with insulin dosing .  3.  Murmur :   Getting echo now.,    For questions or updates, please contact [facility] Please consult [email or web address] for contact info under        Signed, Kristeen Miss, MD   06/17/2018, 10:17 AM

## 2018-06-17 NOTE — Plan of Care (Signed)
  Problem: Clinical Measurements: Goal: Will remain free from infection Outcome: Progressing Goal: Respiratory complications will improve Outcome: Progressing Goal: Cardiovascular complication will be avoided Outcome: Progressing   Problem: Nutrition: Goal: Adequate nutrition will be maintained Outcome: Progressing   Problem: Coping: Goal: Level of anxiety will decrease Outcome: Progressing   Problem: Safety: Goal: Ability to remain free from injury will improve Outcome: Progressing

## 2018-06-17 NOTE — Progress Notes (Signed)
Inpatient Diabetes Program Recommendations  AACE/ADA: New Consensus Statement on Inpatient Glycemic Control (2015)  Target Ranges:  Prepandial:   less than 140 mg/dL      Peak postprandial:   less than 180 mg/dL (1-2 hours)      Critically ill patients:  140 - 180 mg/dL   Lab Results  Component Value Date   GLUCAP 283 (H) 06/17/2018   HGBA1C 8.3 (H) 06/16/2018    Review of Glycemic Control  Diabetes history: DM 2 Outpatient Diabetes medications: Lantus 15 units qam, 30 units qpm, Novolog 8 units breakfast, 10 units lunch, 12 units supper  Current orders for Inpatient glycemic control:  Novolog 09-04-10  Lantus 16 units qpm  A1c 8.3% on 5/22  Inpatient Diabetes Program Recommendations:     -  Consider Lantus 15 units BID   -  Consider adding Novolog 0-9 units tid + Novolog 0-5 units qhs.  Paged Ward Givens, NP about recs.  Thanks,  Christena Deem RN, MSN, BC-ADM Inpatient Diabetes Coordinator Team Pager [phone] (8a-5p)

## 2018-06-17 NOTE — Progress Notes (Signed)
ANTICOAGULATION CONSULT NOTE   Pharmacy Consult for heparin Indication: chest pain/ACS   Patient Measurements: Height: 5\' 1"  (154.9 cm) Weight: 160 lb 1.6 oz (72.6 kg) IBW/kg (Calculated) : 47.8 Heparin Dosing Weight: 63.6 kg  Vital Signs: Temp: 98.2 F (36.8 C) (05/23 0415) Temp Source: Oral (05/23 0415) BP: 123/75 (05/23 0415) Pulse Rate: 85 (05/23 0415)  Labs: Recent Labs    06/16/18 1723 06/16/18 2025 06/16/18 2253 06/17/18 0357  HGB 13.7  --   --  12.7  HCT 39.6  --   --  36.4  PLT 245  --   --  214  APTT  --   --  173*  --   LABPROT  --   --  13.1  --   INR  --   --  1.0  --   HEPARINUNFRC  --   --   --  <0.10*  CREATININE 0.94  --   --   --   TROPONINI 0.74* 0.63* 0.64*  --      Assessment: Admitted with chest pain. Starting heparin drip for rule out ACS.  No anticoagulation prior to admission. H/h plts wnl. SCr 0.9. Trop positive.  Heparin gtt stopped by RN/MD d/t BL aPTT, however drawn after bolus given and not true BL, RN restarting gtt at 0500, will re-time heparin level  Goal of Therapy:  Heparin level 0.3-0.7 units/ml Monitor platelets by anticoagulation protocol: Yes    Plan:  F/u 8 hour heparin level  Daylene Posey, PharmD Clinical Pharmacist Please check AMION for all [facility] Pharmacy numbers 06/17/2018 4:58 AM

## 2018-06-17 NOTE — Progress Notes (Signed)
  Echocardiogram 2D Echocardiogram has been performed.  Delcie Roch 06/17/2018, 11:45 AM

## 2018-06-17 NOTE — Progress Notes (Signed)
ANTICOAGULATION CONSULT NOTE   Pharmacy Consult for heparin Indication: chest pain/ACS   Patient Measurements: Height: 5\' 1"  (154.9 cm) Weight: 160 lb 1.6 oz (72.6 kg) IBW/kg (Calculated) : 47.8 Heparin Dosing Weight: 63.6 kg  Vital Signs: Temp: 98.4 F (36.9 C) (05/23 1417) Temp Source: Oral (05/23 1417) BP: 144/59 (05/23 1417) Pulse Rate: 79 (05/23 1417)  Labs: Recent Labs    06/16/18 1723  06/16/18 2253 06/17/18 0357 06/17/18 1008 06/17/18 1158 06/17/18 2124  HGB 13.7  --   --  12.7  --   --   --   HCT 39.6  --   --  36.4  --   --   --   PLT 245  --   --  214  --   --   --   APTT  --   --  173*  --   --   --   --   LABPROT  --   --  13.1  --   --   --   --   INR  --   --  1.0  --   --   --   --   HEPARINUNFRC  --   --   --  <0.10*  --  0.27* 0.47  CREATININE 0.94  --   --  0.93  --   --   --   TROPONINI 0.74*   < > 0.64* 0.59* 0.41*  --   --    < > = values in this interval not displayed.     Assessment: Admitted with chest pain. Starting heparin drip for rule out ACS.  No anticoagulation prior to admission. H/h plts wnl. SCr 0.9. Trop positive.  Heparin therapeutic this evening at 0.47.   Goal of Therapy:  Heparin level 0.3-0.7 units/ml Monitor platelets by anticoagulation protocol: Yes    Plan:  -Continue heparin 1000 units/hr -Recheck heparin level and CBC with morning labs   Fredonia Highland, PharmD, BCPS Clinical Pharmacist [phone] Please check AMION for all [facility] numbers 06/17/2018

## 2018-06-18 LAB — HEPARIN LEVEL (UNFRACTIONATED): Heparin Unfractionated: 0.42 IU/mL (ref 0.30–0.70)

## 2018-06-18 LAB — CBC
HCT: 37.3 % (ref 36.0–46.0)
Hemoglobin: 13 g/dL (ref 12.0–15.0)
MCH: 32.3 pg (ref 26.0–34.0)
MCHC: 34.9 g/dL (ref 30.0–36.0)
MCV: 92.8 fL (ref 80.0–100.0)
Platelets: 210 10*3/uL (ref 150–400)
RBC: 4.02 MIL/uL (ref 3.87–5.11)
RDW: 12.1 % (ref 11.5–15.5)
WBC: 8.5 10*3/uL (ref 4.0–10.5)
nRBC: 0 % (ref 0.0–0.2)

## 2018-06-18 LAB — GLUCOSE, CAPILLARY
Glucose-Capillary: 366 mg/dL — ABNORMAL HIGH (ref 70–99)
Glucose-Capillary: 199 mg/dL — ABNORMAL HIGH (ref 70–99)

## 2018-06-18 MED ORDER — INSULIN GLARGINE 100 UNIT/ML ~~LOC~~ SOLN
20.0000 [IU] | Freq: Every day | SUBCUTANEOUS | Status: DC
  Administered 2018-06-18 – 2018-06-20 (×3): 20 [IU] via SUBCUTANEOUS
  Filled 2018-06-18 (×5): qty 0.2

## 2018-06-18 MED ORDER — INSULIN GLARGINE 100 UNIT/ML ~~LOC~~ SOLN
15.0000 [IU] | Freq: Every morning | SUBCUTANEOUS | Status: DC
  Administered 2018-06-19: 15 [IU] via SUBCUTANEOUS
  Filled 2018-06-18 (×2): qty 0.15

## 2018-06-18 NOTE — Progress Notes (Signed)
Inpatient Diabetes Program Recommendations  AACE/ADA: New Consensus Statement on Inpatient Glycemic Control (2015)  Target Ranges:  Prepandial:   less than 140 mg/dL      Peak postprandial:   less than 180 mg/dL (1-2 hours)      Critically ill patients:  140 - 180 mg/dL   Lab Results  Component Value Date   GLUCAP 305 (H) 06/17/2018   HGBA1C 8.3 (H) 06/16/2018    Review of Glycemic Control  Diabetes history: DM 2 Outpatient Diabetes medications: Lantus 15 units qam, 30 units qpm, Novolog 8 units breakfast, 10 units lunch, 12 units supper  Current orders for Inpatient glycemic control:  Novolog 09-04-10  Lantus 15 units bid Novolog 0-9 units tid + Novolog 0-5 units qhs  Inpatient Diabetes Program Recommendations:    Fasting glucose 288 this am. Consider increasing Lantus to 20 units bid  Thanks,  Christena Deem RN, MSN, BC-ADM Inpatient Diabetes Coordinator Team Pager [phone] (8a-5p)

## 2018-06-18 NOTE — Progress Notes (Signed)
Progress Note  Patient Name: Sarah Francis Date of Encounter: 06/18/2018  Primary Cardiologist: New to [name]   Subjective   73 year old female with a history of type 2 diabetes mellitus, hypertension, peripheral arterial disease and cigarette smoking.  She is admitted with a non-ST segment elevation myocardial infarction.  troponins have trended down .  Pain free    Inpatient Medications    Scheduled Meds: . aspirin EC  81 mg Oral Daily  . atorvastatin  80 mg Oral q1800  . famotidine  10 mg Oral Daily  . insulin aspart  0-5 Units Subcutaneous QHS  . insulin aspart  0-9 Units Subcutaneous TID WC  . insulin aspart  8 Units Subcutaneous Q breakfast   And  . insulin aspart  10 Units Subcutaneous Q lunch   And  . insulin aspart  12 Units Subcutaneous Q supper  . insulin glargine  15 Units Subcutaneous BID  . lisinopril  2.5 mg Oral Daily  . metoprolol tartrate  12.5 mg Oral BID  . nicotine  14 mg Transdermal Daily   Continuous Infusions: . heparin 1,000 Units/hr (06/17/18 2219)   PRN Meds: acetaminophen, diphenhydrAMINE, nitroGLYCERIN, ondansetron (ZOFRAN) IV   Vital Signs    Vitals:   06/17/18 0938 06/17/18 1417 06/18/18 0550 06/18/18 0823  BP: (!) 149/80 (!) 144/59 (!) 143/85 (!) 165/102  Pulse:  79 83 78  Resp:   18   Temp:  98.4 F (36.9 C) 97.8 F (36.6 C)   TempSrc:  Oral Oral   SpO2:  99% 97%   Weight:   71.8 kg   Height:        Intake/Output Summary (Last 24 hours) at 06/18/2018 1051 Last data filed at 06/18/2018 0600 Gross per 24 hour  Intake 480 ml  Output -  Net 480 ml   Last 3 Weights 06/18/2018 06/16/2018 06/16/2018  Weight (lbs) 158 lb 6.4 oz 160 lb 1.6 oz 160 lb  Weight (kg) 71.85 kg 72.621 kg 72.576 kg      Telemetry    NSR  - Personally Reviewed  ECG     NSR  - Personally Reviewed  Physical Exam   Physical Exam: Blood pressure (!) 165/102, pulse 78, temperature 97.8 F (36.6 C), temperature source Oral, resp. rate 18, height 5'  1" (1.549 m), weight 71.8 kg, SpO2 97 %.  GEN:  Middle age female,  HEENT: Normal NECK: No JVD; No carotid bruits LYMPHATICS: No lymphadenopathy CARDIAC: RRR , soft systolic murmur  RESPIRATORY:  Clear to auscultation without rales, wheezing or rhonchi  ABDOMEN: Soft, non-tender, non-distended MUSCULOSKELETAL:  No edema; No deformity  SKIN: Warm and dry NEUROLOGIC:  Alert and oriented x 3   Labs    Chemistry Recent Labs  Lab 06/16/18 1723 06/17/18 0357  NA 131* 134*  K 4.1 4.3  CL 98 102  CO2 19* 21*  GLUCOSE 272* 279*  BUN 15 16  CREATININE 0.94 0.93  CALCIUM 8.8* 8.7*  GFRNONAA >60 >60  GFRAA >60 >60  ANIONGAP 14 11     Hematology Recent Labs  Lab 06/16/18 1723 06/17/18 0357 06/18/18 0538  WBC 12.1* 8.2 8.5  RBC 4.20 3.90 4.02  HGB 13.7 12.7 13.0  HCT 39.6 36.4 37.3  MCV 94.3 93.3 92.8  MCH 32.6 32.6 32.3  MCHC 34.6 34.9 34.9  RDW 12.4 12.2 12.1  PLT 245 214 210    Cardiac Enzymes Recent Labs  Lab 06/16/18 2025 06/16/18 2253 06/17/18 0357 06/17/18 1008  TROPONINI 0.63*  0.64* 0.59* 0.41*   No results for input(s): TROPIPOC in the last 168 hours.   BNPNo results for input(s): BNP, PROBNP in the last 168 hours.   DDimer No results for input(s): DDIMER in the last 168 hours.   Radiology    Dg Chest 2 View  Result Date: 06/16/2018 CLINICAL DATA:  Chest discomfort and shortness of breath EXAM: CHEST - 2 VIEW COMPARISON:  Chest radiograph November 02, 2013 and chest CT March 21, 2017 FINDINGS: There is atelectatic change in the left base. There is no edema or consolidation. Heart size and pulmonary vascularity are normal. No adenopathy. There is aortic atherosclerosis. No bone lesions. IMPRESSION: Left base atelectasis. No edema or consolidation. Heart size within normal limits. Aortic Atherosclerosis (ICD10-I70.0). Electronically Signed   By: Bretta Bang III M.D.   On: 06/16/2018 18:08    Cardiac Studies     Patient Profile     73 y.o.  female  With DM, PAD admitted with NSTEMI   Assessment & Plan    1.   CAD:   admited with NSTEMI.  Pain started in the middle of the night.   Pain free,  Currently on heparin    we've discussed the risk , benefits, options of cath. She understands and agrees to proceed.  2.  DM:  Appreciate assistance of diabetic coordinator   3.  Murmur :    Mild TR    For questions or updates, please contact [facility] Please consult [email or web address] for contact info under        Signed, Kristeen Miss, MD  06/18/2018, 10:51 AM

## 2018-06-18 NOTE — Progress Notes (Signed)
ANTICOAGULATION CONSULT NOTE - Follow Up Consult  Pharmacy Consult for Heparin Indication: chest pain/ACS  Allergies  Allergen Reactions  . Penicillins Rash    Patient Measurements: Height: 5\' 1"  (154.9 cm) Weight: 158 lb 6.4 oz (71.8 kg) IBW/kg (Calculated) : 47.8 Heparin Dosing Weight: 63.6 kg  Vital Signs: Temp: 97.8 F (36.6 C) (05/24 0550) Temp Source: Oral (05/24 0550) BP: 143/85 (05/24 0550) Pulse Rate: 83 (05/24 0550)  Labs: Recent Labs    06/16/18 1723  06/16/18 2253  06/17/18 0357 06/17/18 1008 06/17/18 1158 06/17/18 2124 06/18/18 0538  HGB 13.7  --   --   --  12.7  --   --   --  13.0  HCT 39.6  --   --   --  36.4  --   --   --  37.3  PLT 245  --   --   --  214  --   --   --  210  APTT  --   --  173*  --   --   --   --   --   --   LABPROT  --   --  13.1  --   --   --   --   --   --   INR  --   --  1.0  --   --   --   --   --   --   HEPARINUNFRC  --   --   --    < > <0.10*  --  0.27* 0.47 0.42  CREATININE 0.94  --   --   --  0.93  --   --   --   --   TROPONINI 0.74*   < > 0.64*  --  0.59* 0.41*  --   --   --    < > = values in this interval not displayed.    Estimated Creatinine Clearance: 50.3 mL/min (by C-G formula based on SCr of 0.93 mg/dL).  Assessment:  Anticoag: heparin gtt for NSTEMI. Hgb 13 stable.. Plts 210. HL 0.42 in goal.  Goal of Therapy:  Heparin level 0.3-0.7 units/ml Monitor platelets by anticoagulation protocol: Yes   Plan:  - IV heparin at 1000 units/hr  - Daily HL and CBC - Cath Tuesday   Leena Tiede S. Merilynn Finland, PharmD, BCPS Clinical Staff Pharmacist [facility] 06/18/2018,7:48 AM

## 2018-06-19 DIAGNOSIS — I10 Essential (primary) hypertension: Secondary | ICD-10-CM

## 2018-06-19 DIAGNOSIS — R011 Cardiac murmur, unspecified: Secondary | ICD-10-CM

## 2018-06-19 LAB — CBC
HCT: 37.5 % (ref 36.0–46.0)
Hemoglobin: 13.2 g/dL (ref 12.0–15.0)
MCH: 32.1 pg (ref 26.0–34.0)
MCHC: 35.2 g/dL (ref 30.0–36.0)
MCV: 91.2 fL (ref 80.0–100.0)
Platelets: 213 10*3/uL (ref 150–400)
RBC: 4.11 MIL/uL (ref 3.87–5.11)
RDW: 11.9 % (ref 11.5–15.5)
WBC: 9.3 10*3/uL (ref 4.0–10.5)
nRBC: 0 % (ref 0.0–0.2)

## 2018-06-19 LAB — BASIC METABOLIC PANEL
Anion gap: 12 (ref 5–15)
BUN: 13 mg/dL (ref 8–23)
CO2: 24 mmol/L (ref 22–32)
Calcium: 9.4 mg/dL (ref 8.9–10.3)
Chloride: 98 mmol/L (ref 98–111)
Creatinine, Ser: 0.73 mg/dL (ref 0.44–1.00)
GFR calc Af Amer: >60 mL/min (ref >60)
GFR calc non Af Amer: >60 mL/min (ref >60)
Glucose, Bld: 165 mg/dL — ABNORMAL HIGH (ref 70–99)
Potassium: 4.1 mmol/L (ref 3.5–5.1)
Sodium: 134 mmol/L — ABNORMAL LOW (ref 135–145)

## 2018-06-19 LAB — GLUCOSE, CAPILLARY
Glucose-Capillary: 296 mg/dL — ABNORMAL HIGH (ref 70–99)
Glucose-Capillary: 324 mg/dL — ABNORMAL HIGH (ref 70–99)
Glucose-Capillary: 328 mg/dL — ABNORMAL HIGH (ref 70–99)
Glucose-Capillary: 229 mg/dL — ABNORMAL HIGH (ref 70–99)

## 2018-06-19 LAB — HEPARIN LEVEL (UNFRACTIONATED): Heparin Unfractionated: 0.45 [IU]/mL (ref 0.30–0.70)

## 2018-06-19 MED ORDER — ASPIRIN 81 MG PO CHEW
81.0000 mg | CHEWABLE_TABLET | ORAL | Status: AC
  Administered 2018-06-20: 81 mg via ORAL
  Filled 2018-06-19: qty 1

## 2018-06-19 MED ORDER — LISINOPRIL 5 MG PO TABS
5.0000 mg | ORAL_TABLET | Freq: Every day | ORAL | Status: DC
  Administered 2018-06-20 – 2018-06-21 (×2): 5 mg via ORAL
  Filled 2018-06-19 (×2): qty 1

## 2018-06-19 MED ORDER — SODIUM CHLORIDE 0.9 % IV SOLN: 250.0000 mL | INTRAVENOUS | Status: DC | PRN

## 2018-06-19 MED ORDER — SODIUM CHLORIDE 0.9 % WEIGHT BASED INFUSION
3.0000 mL/kg/h | INTRAVENOUS | Status: AC
  Administered 2018-06-20: 3 mL/kg/h via INTRAVENOUS

## 2018-06-19 MED ORDER — ALUM & MAG HYDROXIDE-SIMETH 200-200-20 MG/5ML PO SUSP
30.0000 mL | ORAL | Status: DC | PRN
  Administered 2018-06-19 – 2018-06-20 (×2): 30 mL via ORAL
  Filled 2018-06-19 (×2): qty 30

## 2018-06-19 MED ORDER — SODIUM CHLORIDE 0.9% FLUSH
3.0000 mL | Freq: Two times a day (BID) | INTRAVENOUS | Status: DC
  Administered 2018-06-19: 3 mL via INTRAVENOUS

## 2018-06-19 MED ORDER — SODIUM CHLORIDE 0.9% FLUSH: 3.0000 mL | INTRAVENOUS | Status: DC | PRN

## 2018-06-19 MED ORDER — SODIUM CHLORIDE 0.9 % WEIGHT BASED INFUSION
1.0000 mL/kg/h | INTRAVENOUS | Status: DC
  Administered 2018-06-20: 250 mL via INTRAVENOUS

## 2018-06-19 NOTE — Progress Notes (Signed)
Inpatient Diabetes Program Recommendations  AACE/ADA: New Consensus Statement on Inpatient Glycemic Control (2015)  Target Ranges:  Prepandial:   less than 140 mg/dL      Peak postprandial:   less than 180 mg/dL (1-2 hours)      Critically ill patients:  140 - 180 mg/dL   Lab Results  Component Value Date   GLUCAP 296 (H) 06/19/2018   HGBA1C 8.3 (H) 06/16/2018   Review of Glycemic Control Results for Sarah Francis, Sarah Francis (MRN [id number]) as of 06/19/2018 09:34  Ref. Range 06/18/2018 11:14 06/18/2018 16:26 06/19/2018 07:40  Glucose-Capillary Latest Ref Range: 70 - 99 mg/dL 349 (H) 179 (H) 150 (H)   Diabetes history:DM 2 Outpatient Diabetes medications:Lantus 15 units qam, 30 units qpm, Novolog 8 units breakfast, 10 units lunch, 12 units supper  Current orders for Inpatient glycemic control:  Novolog 09-04-10  Lantus 15 units QAM, 20 units QHS Novolog 0-9 units tid + Novolog 0-5 units qhs  Inpatient Diabetes Program Recommendations:    Fasting glucose 296 this am and post prandials yesterday exceed 200-300 mg/dL.   Consider: -Increasing Lantus to 24 units QHS, 18 units QAM. - Increasing meal coverage to 12 units TID (assuming that patient consumes >50% of meal)   Thanks, Lujean Rave, MSN, RNC-OB Diabetes Coordinator [phone] (8a-5p)

## 2018-06-19 NOTE — Progress Notes (Signed)
ANTICOAGULATION CONSULT NOTE - Follow Up Consult  Pharmacy Consult for Heparin Indication: chest pain/ACS  Allergies  Allergen Reactions  . Penicillins Rash    Patient Measurements: Height: 5\' 1"  (154.9 cm) Weight: 158 lb 9.6 oz (71.9 kg) IBW/kg (Calculated) : 47.8 Heparin Dosing Weight: 63.6 kg  Vital Signs: Temp: 98 F (36.7 C) (05/25 0527) Temp Source: Oral (05/25 0527) BP: 161/76 (05/25 0815) Pulse Rate: 76 (05/25 0824)  Labs: Recent Labs    06/16/18 1723  06/16/18 2253 06/17/18 0357 06/17/18 1008  06/17/18 2124 06/18/18 0538 06/19/18 0552  HGB 13.7  --   --  12.7  --   --   --  13.0 13.2  HCT 39.6  --   --  36.4  --   --   --  37.3 37.5  PLT 245  --   --  214  --   --   --  210 213  APTT  --   --  173*  --   --   --   --   --   --   LABPROT  --   --  13.1  --   --   --   --   --   --   INR  --   --  1.0  --   --   --   --   --   --   HEPARINUNFRC  --   --   --  <0.10*  --    < > 0.47 0.42 0.45  CREATININE 0.94  --   --  0.93  --   --   --   --   --   TROPONINI 0.74*   < > 0.64* 0.59* 0.41*  --   --   --   --    < > = values in this interval not displayed.    Estimated Creatinine Clearance: 50.3 mL/min (by C-G formula based on SCr of 0.93 mg/dL).  Assessment: Sarah Francis is an 73yo female admitted with NSTEMI. Pharmacy consulted for heparin gtt.   5/26 Update: Heparin infusion currently at 1000 units/hr and heparin level continues to be therapeutic at 0.45. No infusion issues noted. CBC continues to be stable. Hgb 13.2 pltc 213. No changes.  Goal of Therapy:  Heparin level 0.3-0.7 units/ml Monitor platelets by anticoagulation protocol: Yes   Plan:  Continue IV heparin at 1000 units/hr  -Daily HL and CBC -Cath Tuesday  Thank you for involving pharmacy in this patient's care.  Wendelyn Breslow, PharmD PGY1 Pharmacy Resident Phone: [phone] 06/19/2018 8:29 AM

## 2018-06-19 NOTE — Progress Notes (Signed)
Progress Note  Patient Name: Sarah Francis Date of Encounter: 06/19/2018  Primary Cardiologist: New to [facility]   Subjective   73 year old female with a history of type 2 diabetes mellitus, hypertension, peripheral arterial disease and cigarette smoking.  She is admitted with a non-ST segment elevation myocardial infarction.  troponins peaked at .74 Pain free.  For cath tomorrow  bp is a bit high    Inpatient Medications    Scheduled Meds: . aspirin EC  81 mg Oral Daily  . atorvastatin  80 mg Oral q1800  . famotidine  10 mg Oral Daily  . insulin aspart  0-5 Units Subcutaneous QHS  . insulin aspart  0-9 Units Subcutaneous TID WC  . insulin aspart  8 Units Subcutaneous Q breakfast   And  . insulin aspart  10 Units Subcutaneous Q lunch   And  . insulin aspart  12 Units Subcutaneous Q supper  . insulin glargine  15 Units Subcutaneous q morning - 10a  . insulin glargine  20 Units Subcutaneous QHS  . lisinopril  2.5 mg Oral Daily  . metoprolol tartrate  12.5 mg Oral BID  . nicotine  14 mg Transdermal Daily   Continuous Infusions: . heparin 1,000 Units/hr (06/19/18 0600)   PRN Meds: acetaminophen, diphenhydrAMINE, nitroGLYCERIN, ondansetron (ZOFRAN) IV   Vital Signs    Vitals:   06/18/18 2204 06/19/18 0527 06/19/18 0815 06/19/18 0824  BP: (!) 142/75 134/75 (!) 161/76   Pulse: 82 69  76  Resp: 18 18    Temp: 98.4 F (36.9 C) 98 F (36.7 C)    TempSrc: Oral Oral    SpO2: 99% 99%    Weight:  71.9 kg    Height:        Intake/Output Summary (Last 24 hours) at 06/19/2018 0859 Last data filed at 06/19/2018 0600 Gross per 24 hour  Intake 120 ml  Output -  Net 120 ml   Last 3 Weights 06/19/2018 06/18/2018 06/16/2018  Weight (lbs) 158 lb 9.6 oz 158 lb 6.4 oz 160 lb 1.6 oz  Weight (kg) 71.94 kg 71.85 kg 72.621 kg      Telemetry    NSR , PACs   - Personally Reviewed  ECG     NSR  - Personally Reviewed  Physical Exam   Physical Exam: Blood pressure (!) 161/76,  pulse 76, temperature 98 F (36.7 C), temperature source Oral, resp. rate 18, height 5\' 1"  (1.549 m), weight 71.9 kg, SpO2 99 %.  GEN:  Well nourished, well developed in no acute distress HEENT: Normal NECK: No JVD; No carotid bruits LYMPHATICS: No lymphadenopathy CARDIAC: RRR , soft systolic murmur  RESPIRATORY:  Clear to auscultation without rales, wheezing or rhonchi  ABDOMEN: Soft, non-tender, non-distended MUSCULOSKELETAL:  No edema; No deformity  SKIN: Warm and dry NEUROLOGIC:  Alert and oriented x 3    Labs    Chemistry Recent Labs  Lab 06/16/18 1723 06/17/18 0357  NA 131* 134*  K 4.1 4.3  CL 98 102  CO2 19* 21*  GLUCOSE 272* 279*  BUN 15 16  CREATININE 0.94 0.93  CALCIUM 8.8* 8.7*  GFRNONAA >60 >60  GFRAA >60 >60  ANIONGAP 14 11     Hematology Recent Labs  Lab 06/17/18 0357 06/18/18 0538 06/19/18 0552  WBC 8.2 8.5 9.3  RBC 3.90 4.02 4.11  HGB 12.7 13.0 13.2  HCT 36.4 37.3 37.5  MCV 93.3 92.8 91.2  MCH 32.6 32.3 32.1  MCHC 34.9 34.9 35.2  RDW  12.2 12.1 11.9  PLT 214 210 213    Cardiac Enzymes Recent Labs  Lab 06/16/18 2025 06/16/18 2253 06/17/18 0357 06/17/18 1008  TROPONINI 0.63* 0.64* 0.59* 0.41*   No results for input(s): TROPIPOC in the last 168 hours.   BNPNo results for input(s): BNP, PROBNP in the last 168 hours.   DDimer No results for input(s): DDIMER in the last 168 hours.   Radiology    No results found.  Cardiac Studies     Patient Profile     73 y.o. female  With DM, PAD admitted with NSTEMI   Assessment & Plan    1.   CAD:   admited with NSTEMI.    Currently pain free.    2.  DM:  On insulin .    3.  Murmur :    Mild TR    4.  HTN:  On lisinopril  2.5 mg a day ,  Will increase to 5 mg    For questions or updates, please contact [facility] Please consult [email or web address] for contact info under        Signed, Kristeen Miss, MD  06/19/2018, 8:59 AM

## 2018-06-20 ENCOUNTER — Encounter (HOSPITAL_COMMUNITY)
Admission: EM | Disposition: A | Discharge status: Home / Self Care | Payer: Self-pay | Source: Home / Self Care | Attending: Cardiology

## 2018-06-20 DIAGNOSIS — F17200 Nicotine dependence, unspecified, uncomplicated: Secondary | ICD-10-CM

## 2018-06-20 DIAGNOSIS — E782 Mixed hyperlipidemia: Secondary | ICD-10-CM

## 2018-06-20 DIAGNOSIS — I739 Peripheral vascular disease, unspecified: Secondary | ICD-10-CM

## 2018-06-20 HISTORY — PX: LEFT HEART CATH AND CORONARY ANGIOGRAPHY: CATH118249

## 2018-06-20 HISTORY — PX: CORONARY STENT INTERVENTION: CATH118234

## 2018-06-20 LAB — CBC
HCT: 36.9 % (ref 36.0–46.0)
Hemoglobin: 13.1 g/dL (ref 12.0–15.0)
MCH: 32.8 pg (ref 26.0–34.0)
MCHC: 35.5 g/dL (ref 30.0–36.0)
MCV: 92.3 fL (ref 80.0–100.0)
Platelets: 200 10*3/uL (ref 150–400)
RBC: 4 MIL/uL (ref 3.87–5.11)
RDW: 11.9 % (ref 11.5–15.5)
WBC: 9.2 10*3/uL (ref 4.0–10.5)
nRBC: 0 % (ref 0.0–0.2)

## 2018-06-20 LAB — POCT ACTIVATED CLOTTING TIME
Activated Clotting Time: 252 seconds
Activated Clotting Time: 307 seconds
Activated Clotting Time: 263 seconds

## 2018-06-20 LAB — GLUCOSE, CAPILLARY
Glucose-Capillary: 189 mg/dL — ABNORMAL HIGH (ref 70–99)
Glucose-Capillary: 166 mg/dL — ABNORMAL HIGH (ref 70–99)
Glucose-Capillary: 154 mg/dL — ABNORMAL HIGH (ref 70–99)
Glucose-Capillary: 212 mg/dL — ABNORMAL HIGH (ref 70–99)

## 2018-06-20 LAB — HEPARIN LEVEL (UNFRACTIONATED): Heparin Unfractionated: 0.34 IU/mL (ref 0.30–0.70)

## 2018-06-20 SURGERY — LEFT HEART CATH AND CORONARY ANGIOGRAPHY
Anesthesia: LOCAL

## 2018-06-20 MED ORDER — INSULIN GLARGINE 100 UNIT/ML ~~LOC~~ SOLN
20.0000 [IU] | Freq: Every morning | SUBCUTANEOUS | Status: DC
  Administered 2018-06-21: 20 [IU] via SUBCUTANEOUS
  Filled 2018-06-20: qty 0.2

## 2018-06-20 MED ORDER — VERAPAMIL HCL 2.5 MG/ML IV SOLN
INTRAVENOUS | Status: DC | PRN
  Administered 2018-06-20: 14:00:00 via INTRA_ARTERIAL

## 2018-06-20 MED ORDER — FENTANYL CITRATE (PF) 100 MCG/2ML IJ SOLN
INTRAMUSCULAR | Status: AC
  Filled 2018-06-20: qty 2

## 2018-06-20 MED ORDER — HEART ATTACK BOUNCING BOOK
Freq: Once | Status: AC
  Administered 2018-06-20: 1
  Filled 2018-06-20: qty 1

## 2018-06-20 MED ORDER — HEPARIN (PORCINE) IN NACL 1000-0.9 UT/500ML-% IV SOLN
INTRAVENOUS | Status: DC | PRN
  Administered 2018-06-20 (×2): 500 mL

## 2018-06-20 MED ORDER — TICAGRELOR 90 MG PO TABS
ORAL_TABLET | ORAL | Status: AC
  Filled 2018-06-20: qty 2

## 2018-06-20 MED ORDER — NITROGLYCERIN 1 MG/10 ML FOR IR/CATH LAB
INTRA_ARTERIAL | Status: AC
  Filled 2018-06-20: qty 10

## 2018-06-20 MED ORDER — SODIUM CHLORIDE 0.9% FLUSH
3.0000 mL | Freq: Two times a day (BID) | INTRAVENOUS | Status: DC
  Administered 2018-06-21: 3 mL via INTRAVENOUS

## 2018-06-20 MED ORDER — HEPARIN SODIUM (PORCINE) 1000 UNIT/ML IJ SOLN
INTRAMUSCULAR | Status: AC
  Filled 2018-06-20: qty 1

## 2018-06-20 MED ORDER — SODIUM CHLORIDE 0.9 % WEIGHT BASED INFUSION: 1.0000 mL/kg/h | INTRAVENOUS | Status: AC

## 2018-06-20 MED ORDER — IOHEXOL 350 MG/ML SOLN
INTRAVENOUS | Status: DC | PRN
  Administered 2018-06-20: 130 mL via INTRA_ARTERIAL

## 2018-06-20 MED ORDER — LABETALOL HCL 5 MG/ML IV SOLN
INTRAVENOUS | Status: AC
  Filled 2018-06-20: qty 4

## 2018-06-20 MED ORDER — TICAGRELOR 90 MG PO TABS
ORAL_TABLET | ORAL | Status: DC | PRN
  Administered 2018-06-20: 180 mg via ORAL

## 2018-06-20 MED ORDER — LABETALOL HCL 5 MG/ML IV SOLN: 10.0000 mg | INTRAVENOUS | Status: AC | PRN

## 2018-06-20 MED ORDER — MIDAZOLAM HCL 2 MG/2ML IJ SOLN
INTRAMUSCULAR | Status: AC
  Filled 2018-06-20: qty 2

## 2018-06-20 MED ORDER — NITROGLYCERIN 1 MG/10 ML FOR IR/CATH LAB
INTRA_ARTERIAL | Status: DC | PRN
  Administered 2018-06-20 (×2): 150 ug via INTRACORONARY
  Administered 2018-06-20: 200 ug via INTRACORONARY
  Administered 2018-06-20: 100 ug via INTRACORONARY
  Administered 2018-06-20 (×2): 150 ug via INTRACORONARY

## 2018-06-20 MED ORDER — SODIUM CHLORIDE 0.9 % IV SOLN: 250.0000 mL | INTRAVENOUS | Status: DC | PRN

## 2018-06-20 MED ORDER — HEPARIN (PORCINE) IN NACL 1000-0.9 UT/500ML-% IV SOLN
INTRAVENOUS | Status: AC
  Filled 2018-06-20: qty 1000

## 2018-06-20 MED ORDER — LIDOCAINE HCL (PF) 1 % IJ SOLN
INTRAMUSCULAR | Status: AC
  Filled 2018-06-20: qty 30

## 2018-06-20 MED ORDER — TICAGRELOR 90 MG PO TABS
90.0000 mg | ORAL_TABLET | Freq: Two times a day (BID) | ORAL | Status: DC
  Administered 2018-06-21 (×2): 90 mg via ORAL
  Filled 2018-06-20 (×2): qty 1

## 2018-06-20 MED ORDER — FENTANYL CITRATE (PF) 100 MCG/2ML IJ SOLN
INTRAMUSCULAR | Status: DC | PRN
  Administered 2018-06-20 (×2): 25 ug via INTRAVENOUS

## 2018-06-20 MED ORDER — HYDRALAZINE HCL 20 MG/ML IJ SOLN: 10.0000 mg | INTRAMUSCULAR | Status: AC | PRN

## 2018-06-20 MED ORDER — LABETALOL HCL 5 MG/ML IV SOLN
INTRAVENOUS | Status: DC | PRN
  Administered 2018-06-20: 10 mg via INTRAVENOUS

## 2018-06-20 MED ORDER — ANGIOPLASTY BOOK
Freq: Once | Status: AC
  Administered 2018-06-21: 1
  Filled 2018-06-20: qty 1

## 2018-06-20 MED ORDER — ACETAMINOPHEN 325 MG PO TABS: 650.0000 mg | ORAL_TABLET | ORAL | Status: DC | PRN

## 2018-06-20 MED ORDER — VERAPAMIL HCL 2.5 MG/ML IV SOLN
INTRAVENOUS | Status: AC
  Filled 2018-06-20: qty 2

## 2018-06-20 MED ORDER — SODIUM CHLORIDE 0.9% FLUSH: 3.0000 mL | INTRAVENOUS | Status: DC | PRN

## 2018-06-20 MED ORDER — LIDOCAINE HCL (PF) 1 % IJ SOLN
INTRAMUSCULAR | Status: DC | PRN
  Administered 2018-06-20: 2 mL

## 2018-06-20 MED ORDER — METOPROLOL TARTRATE 25 MG PO TABS
25.0000 mg | ORAL_TABLET | Freq: Two times a day (BID) | ORAL | Status: DC
  Administered 2018-06-20 – 2018-06-21 (×3): 25 mg via ORAL
  Filled 2018-06-20 (×3): qty 1

## 2018-06-20 MED ORDER — MIDAZOLAM HCL 2 MG/2ML IJ SOLN
INTRAMUSCULAR | Status: DC | PRN
  Administered 2018-06-20: 1 mg via INTRAVENOUS
  Administered 2018-06-20: 2 mg via INTRAVENOUS

## 2018-06-20 MED ORDER — HEPARIN SODIUM (PORCINE) 1000 UNIT/ML IJ SOLN
INTRAMUSCULAR | Status: DC | PRN
  Administered 2018-06-20: 4000 [IU] via INTRAVENOUS
  Administered 2018-06-20 (×2): 3000 [IU] via INTRAVENOUS

## 2018-06-20 SURGICAL SUPPLY — 22 items
BALLN SAPPHIRE 2.5X12 (BALLOONS) ×2
BALLN SAPPHIRE ~~LOC~~ 3.0X8 (BALLOONS) ×1 IMPLANT
BALLN SAPPHIRE ~~LOC~~ 3.25X12 (BALLOONS) ×1 IMPLANT
BALLOON SAPPHIRE 2.5X12 (BALLOONS) IMPLANT
CATH 5FR JL3.5 JR4 ANG PIG MP (CATHETERS) ×1 IMPLANT
CATH LAUNCHER 5F JR4 (CATHETERS) ×1 IMPLANT
CATH LAUNCHER 6FR JR4 (CATHETERS) ×1 IMPLANT
COVER DOME SNAP 22 D (MISCELLANEOUS) ×1 IMPLANT
DEVICE RAD TR BAND REGULAR (VASCULAR PRODUCTS) ×1 IMPLANT
GLIDESHEATH SLEND SS 6F .021 (SHEATH) ×1 IMPLANT
GUIDEWIRE INQWIRE 1.5J.035X260 (WIRE) IMPLANT
INQWIRE 1.5J .035X260CM (WIRE) ×2
KIT ENCORE 26 ADVANTAGE (KITS) ×1 IMPLANT
KIT HEART LEFT (KITS) ×2 IMPLANT
PACK CARDIAC CATHETERIZATION (CUSTOM PROCEDURE TRAY) ×2 IMPLANT
STENT SYNERGY DES 2.75X12 (Permanent Stent) ×2 IMPLANT
STENT SYNERGY DES 2.75X20 (Permanent Stent) ×1 IMPLANT
STENT SYNERGY DES 3X12 (Permanent Stent) ×1 IMPLANT
TRANSDUCER W/STOPCOCK (MISCELLANEOUS) ×2 IMPLANT
TUBING CIL FLEX 10 FLL-RA (TUBING) ×2 IMPLANT
WIRE ASAHI GRAND SLAM 180CM (WIRE) ×1 IMPLANT
WIRE COUGAR XT STRL 190CM (WIRE) ×1 IMPLANT

## 2018-06-20 NOTE — Progress Notes (Signed)
Inpatient Diabetes Program Recommendations  AACE/ADA: New Consensus Statement on Inpatient Glycemic Control (2015)  Target Ranges:  Prepandial:   less than 140 mg/dL      Peak postprandial:   less than 180 mg/dL (1-2 hours)      Critically ill patients:  140 - 180 mg/dL   Lab Results  Component Value Date   GLUCAP 189 (H) 06/20/2018   HGBA1C 8.3 (H) 06/16/2018    Review of Glycemic Control Results for Sarah Francis, Sarah Francis (MRN [id number]) as of 06/20/2018 10:00  Ref. Range 06/19/2018 12:08 06/19/2018 16:25 06/19/2018 21:47 06/20/2018 07:47  Glucose-Capillary Latest Ref Range: 70 - 99 mg/dL 917 (H) 915 (H) 056 (H) 189 (H)   Diabetes history:DM 2 Outpatient Diabetes medications:Lantus 15 units qam, 30 units qpm, Novolog 8 units breakfast, 10 units lunch, 12 units supper  Current orders for Inpatient glycemic control:  Novolog 09-04-10  Lantus 15units QAM, 20 units QHS Novolog 0-9 units tid + Novolog 0-5 units qhs  Inpatient Diabetes Program Recommendations:    Consider: -Increasing Lantus to 24 units QHS, 18 units QAM. - Increasing meal coverage to 12 units TID (assuming that patient consumes >50% of meal) - Increasing correction to Novolog 0-15 units TID.  Thanks, Lujean Rave, MSN, RNC-OB Diabetes Coordinator [phone] (8a-5p)

## 2018-06-20 NOTE — H&P (View-Only) (Signed)
Progress Note  Patient Name: Sarah Francis Date of Encounter: 06/20/2018  Primary Cardiologist: Kristeen Miss, MD   Subjective   Feeling well this morning. No chest pain overnight.   Inpatient Medications    Scheduled Meds: . aspirin EC  81 mg Oral Daily  . atorvastatin  80 mg Oral q1800  . famotidine  10 mg Oral Daily  . insulin aspart  0-5 Units Subcutaneous QHS  . insulin aspart  0-9 Units Subcutaneous TID WC  . insulin aspart  8 Units Subcutaneous Q breakfast   And  . insulin aspart  10 Units Subcutaneous Q lunch   And  . insulin aspart  12 Units Subcutaneous Q supper  . insulin glargine  15 Units Subcutaneous q morning - 10a  . insulin glargine  20 Units Subcutaneous QHS  . lisinopril  5 mg Oral Daily  . metoprolol tartrate  12.5 mg Oral BID  . nicotine  14 mg Transdermal Daily  . sodium chloride flush  3 mL Intravenous Q12H   Continuous Infusions: . sodium chloride    . sodium chloride 1 mL/kg/hr (06/20/18 0700)  . heparin 1,000 Units/hr (06/20/18 0700)   PRN Meds: sodium chloride, acetaminophen, alum & mag hydroxide-simeth, diphenhydrAMINE, nitroGLYCERIN, ondansetron (ZOFRAN) IV, sodium chloride flush   Vital Signs    Vitals:   06/19/18 0824 06/19/18 1406 06/19/18 2020 06/20/18 0421  BP:  (!) 159/102 (!) 124/45 (!) 142/72  Pulse: 76 82 84 79  Resp:  18 18 19   Temp:  98.5 F (36.9 C) 98.4 F (36.9 C) 98.6 F (37 C)  TempSrc:  Oral Oral Oral  SpO2:  98% 98% 98%  Weight:    72.6 kg  Height:        Intake/Output Summary (Last 24 hours) at 06/20/2018 0817 Last data filed at 06/20/2018 0700 Gross per 24 hour  Intake 1637.75 ml  Output -  Net 1637.75 ml   Last 3 Weights 06/20/2018 06/19/2018 06/18/2018  Weight (lbs) 160 lb 1.6 oz 158 lb 9.6 oz 158 lb 6.4 oz  Weight (kg) 72.621 kg 71.94 kg 71.85 kg      Telemetry    SR - Personally Reviewed  ECG    N/a - Personally Reviewed  Physical Exam   GEN: No acute distress.   Neck: No JVD Cardiac:  RRR, no murmurs, rubs, or gallops.  Respiratory: Clear to auscultation bilaterally. GI: Soft, nontender, non-distended  MS: No edema; No deformity. Neuro:  Nonfocal  Psych: Normal affect   Labs    Chemistry Recent Labs  Lab 06/16/18 1723 06/17/18 0357 06/19/18 1147  NA 131* 134* 134*  K 4.1 4.3 4.1  CL 98 102 98  CO2 19* 21* 24  GLUCOSE 272* 279* 165*  BUN 15 16 13   CREATININE 0.94 0.93 0.73  CALCIUM 8.8* 8.7* 9.4  GFRNONAA >60 >60 >60  GFRAA >60 >60 >60  ANIONGAP 14 11 12      Hematology Recent Labs  Lab 06/18/18 0538 06/19/18 0552 06/20/18 0418  WBC 8.5 9.3 9.2  RBC 4.02 4.11 4.00  HGB 13.0 13.2 13.1  HCT 37.3 37.5 36.9  MCV 92.8 91.2 92.3  MCH 32.3 32.1 32.8  MCHC 34.9 35.2 35.5  RDW 12.1 11.9 11.9  PLT 210 213 200    Cardiac Enzymes Recent Labs  Lab 06/16/18 2025 06/16/18 2253 06/17/18 0357 06/17/18 1008  TROPONINI 0.63* 0.64* 0.59* 0.41*   No results for input(s): TROPIPOC in the last 168 hours.   BNPNo results for  input(s): BNP, PROBNP in the last 168 hours.   DDimer No results for input(s): DDIMER in the last 168 hours.   Radiology    No results found.  Cardiac Studies   TTE: 06/17/18  IMPRESSIONS    1. The left ventricle has hyperdynamic systolic function, with an ejection fraction of >65%. The cavity size was normal. There is mildly increased left ventricular wall thickness. Left ventricular diastolic Doppler parameters are consistent with  impaired relaxation.  2. The right ventricle has normal systolic function. The cavity was normal. There is no increase in right ventricular wall thickness.  3. The aortic root and ascending aorta are normal in size and structure.  4. The interatrial septum was not assessed.  Patient Profile     73 y.o. female with PMH of DM, HTN, PAD and tobacco use who presented with chest pain and found to have a NSTEMI.   Assessment & Plan    1. CAD/NSTEMI: troponin peaked at 0.74. Planned for cardiac  cath. EF noted at 65% with hyperdynamic LV function. Planned for cardiac cath today.  -- on ASA, statin, BB, ACEi, and IV heparin.   2. HL: LDL 60, Lipitor was increased from 40->80mg  on admission.   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 Seen by the diabetes coordinator. Will adjust insulin per recommendations.   4. HTN: Bp is elevated, will further titrate metoprolol to 25mg  BID.   5. Tobacco use: using nicotine patch.  For questions or updates, please contact [facility] Please consult [email or web address] for contact info under   Signed, Laverda PageLindsay Roberts, NP  06/20/2018, 8:17 AM

## 2018-06-20 NOTE — Progress Notes (Signed)
Progress Note  Patient Name: Sarah Francis Date of Encounter: 06/20/2018  Primary Cardiologist: Kristeen Miss, MD   Subjective   Feeling well this morning. No chest pain overnight.   Inpatient Medications    Scheduled Meds: . aspirin EC  81 mg Oral Daily  . atorvastatin  80 mg Oral q1800  . famotidine  10 mg Oral Daily  . insulin aspart  0-5 Units Subcutaneous QHS  . insulin aspart  0-9 Units Subcutaneous TID WC  . insulin aspart  8 Units Subcutaneous Q breakfast   And  . insulin aspart  10 Units Subcutaneous Q lunch   And  . insulin aspart  12 Units Subcutaneous Q supper  . insulin glargine  15 Units Subcutaneous q morning - 10a  . insulin glargine  20 Units Subcutaneous QHS  . lisinopril  5 mg Oral Daily  . metoprolol tartrate  12.5 mg Oral BID  . nicotine  14 mg Transdermal Daily  . sodium chloride flush  3 mL Intravenous Q12H   Continuous Infusions: . sodium chloride    . sodium chloride 1 mL/kg/hr (06/20/18 0700)  . heparin 1,000 Units/hr (06/20/18 0700)   PRN Meds: sodium chloride, acetaminophen, alum & mag hydroxide-simeth, diphenhydrAMINE, nitroGLYCERIN, ondansetron (ZOFRAN) IV, sodium chloride flush   Vital Signs    Vitals:   06/19/18 0824 06/19/18 1406 06/19/18 2020 06/20/18 0421  BP:  (!) 159/102 (!) 124/45 (!) 142/72  Pulse: 76 82 84 79  Resp:  18 18 19   Temp:  98.5 F (36.9 C) 98.4 F (36.9 C) 98.6 F (37 C)  TempSrc:  Oral Oral Oral  SpO2:  98% 98% 98%  Weight:    72.6 kg  Height:        Intake/Output Summary (Last 24 hours) at 06/20/2018 0817 Last data filed at 06/20/2018 0700 Gross per 24 hour  Intake 1637.75 ml  Output -  Net 1637.75 ml   Last 3 Weights 06/20/2018 06/19/2018 06/18/2018  Weight (lbs) 160 lb 1.6 oz 158 lb 9.6 oz 158 lb 6.4 oz  Weight (kg) 72.621 kg 71.94 kg 71.85 kg      Telemetry    SR - Personally Reviewed  ECG    N/a - Personally Reviewed  Physical Exam   GEN: No acute distress.   Neck: No JVD Cardiac:  RRR, no murmurs, rubs, or gallops.  Respiratory: Clear to auscultation bilaterally. GI: Soft, nontender, non-distended  MS: No edema; No deformity. Neuro:  Nonfocal  Psych: Normal affect   Labs    Chemistry Recent Labs  Lab 06/16/18 1723 06/17/18 0357 06/19/18 1147  NA 131* 134* 134*  K 4.1 4.3 4.1  CL 98 102 98  CO2 19* 21* 24  GLUCOSE 272* 279* 165*  BUN 15 16 13   CREATININE 0.94 0.93 0.73  CALCIUM 8.8* 8.7* 9.4  GFRNONAA >60 >60 >60  GFRAA >60 >60 >60  ANIONGAP 14 11 12      Hematology Recent Labs  Lab 06/18/18 0538 06/19/18 0552 06/20/18 0418  WBC 8.5 9.3 9.2  RBC 4.02 4.11 4.00  HGB 13.0 13.2 13.1  HCT 37.3 37.5 36.9  MCV 92.8 91.2 92.3  MCH 32.3 32.1 32.8  MCHC 34.9 35.2 35.5  RDW 12.1 11.9 11.9  PLT 210 213 200    Cardiac Enzymes Recent Labs  Lab 06/16/18 2025 06/16/18 2253 06/17/18 0357 06/17/18 1008  TROPONINI 0.63* 0.64* 0.59* 0.41*   No results for input(s): TROPIPOC in the last 168 hours.   BNPNo results for  input(s): BNP, PROBNP in the last 168 hours.   DDimer No results for input(s): DDIMER in the last 168 hours.   Radiology    No results found.  Cardiac Studies   TTE: 06/17/18  IMPRESSIONS    1. The left ventricle has hyperdynamic systolic function, with an ejection fraction of >65%. The cavity size was normal. There is mildly increased left ventricular wall thickness. Left ventricular diastolic Doppler parameters are consistent with  impaired relaxation.  2. The right ventricle has normal systolic function. The cavity was normal. There is no increase in right ventricular wall thickness.  3. The aortic root and ascending aorta are normal in size and structure.  4. The interatrial septum was not assessed.  Patient Profile     73 y.o. female with PMH of DM, HTN, PAD and tobacco use who presented with chest pain and found to have a NSTEMI.   Assessment & Plan    1. CAD/NSTEMI: troponin peaked at 0.74. Planned for cardiac  cath. EF noted at 65% with hyperdynamic LV function. Planned for cardiac cath today.  -- on ASA, statin, BB, ACEi, and IV heparin.   2. HL: LDL 60, Lipitor was increased from 40->80mg on admission.   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 Seen by the diabetes coordinator. Will adjust insulin per recommendations.   4. HTN: Bp is elevated, will further titrate metoprolol to 25mg BID.   5. Tobacco use: using nicotine patch.  For questions or updates, please contact [facility] Please consult [email or web address] for contact info under   Signed, Edric Fetterman, NP  06/20/2018, 8:17 AM    

## 2018-06-20 NOTE — Interval H&P Note (Signed)
Cath Lab Visit (complete for each Cath Lab visit)  Clinical Evaluation Leading to the Procedure:   ACS: Yes.    Non-ACS:    Anginal Classification: CCS IV  Anti-ischemic medical therapy: Minimal Therapy (1 class of medications)  Non-Invasive Test Results: No non-invasive testing performed  Prior CABG: No previous CABG      History and Physical Interval Note:  06/20/2018 1:29 PM  Sarah Francis  has presented today for surgery, with the diagnosis of NSTEMI.  The various methods of treatment have been discussed with the patient and family. After consideration of risks, benefits and other options for treatment, the patient has consented to  Procedure(s): LEFT HEART CATH AND CORONARY ANGIOGRAPHY (N/A) as a surgical intervention.  The patient's history has been reviewed, patient examined, no change in status, stable for surgery.  I have reviewed the patient's chart and labs.  Questions were answered to the patient's satisfaction.     Tonny Bollman

## 2018-06-20 NOTE — Care Management Important Message (Signed)
Important Message  Patient Details  Name: Sarah Francis MRN: [id number] Date of Birth: 20-Dec-1945   Medicare Important Message Given:  Yes    [name] 06/20/2018, 3:22 PM

## 2018-06-20 NOTE — Progress Notes (Signed)
ANTICOAGULATION CONSULT NOTE - Follow Up Consult  Pharmacy Consult for Heparin Indication: chest pain/ACS  Allergies  Allergen Reactions  . Penicillins Rash    Patient Measurements: Height: 5\' 1"  (154.9 cm) Weight: 160 lb 1.6 oz (72.6 kg) IBW/kg (Calculated) : 47.8 Heparin Dosing Weight: 63.6 kg  Vital Signs: Temp: 98.6 F (37 C) (05/26 0421) Temp Source: Oral (05/26 0421) BP: 131/76 (05/26 0903) Pulse Rate: 79 (05/26 0421)  Labs: Recent Labs    06/18/18 0538 06/19/18 0552 06/19/18 1147 06/20/18 0418  HGB 13.0 13.2  --  13.1  HCT 37.3 37.5  --  36.9  PLT 210 213  --  200  HEPARINUNFRC 0.42 0.45  --  0.34  CREATININE  --   --  0.73  --     Estimated Creatinine Clearance: 58.8 mL/min (by C-G formula based on SCr of 0.73 mg/dL).  Assessment: Sarah Francis is an 73yo female admitted with NSTEMI. Pharmacy consulted for heparin gtt.   Heparin level is therapeutic at 0.34 on 1000 units/hr. No bleeding noted, CBC is normal.  Goal of Therapy:  Heparin level 0.3-0.7 units/ml Monitor platelets by anticoagulation protocol: Yes   Plan:  -Continue IV heparin at 1000 units/hr  -Daily heparin level and CBC -F/U after cath  Thank you for involving pharmacy in this patient's care.  Loura Back, PharmD, BCPS Clinical Pharmacist Clinical phone for 06/20/2018 until 3p is [phone] 06/20/2018 12:57 PM  **Pharmacist phone directory can now be found on [email or web address] listed under [facility]**

## 2018-06-20 NOTE — Progress Notes (Signed)
TR BAND REMOVAL  LOCATION:    right radial  DEFLATED PER PROTOCOL:    Yes.    TIME BAND OFF / DRESSING APPLIED:    21:15   SITE UPON ARRIVAL:    Level 0  SITE AFTER BAND REMOVAL:    Level 1 (Bruise)  CIRCULATION SENSATION AND MOVEMENT:    Within Normal Limits   Yes.    COMMENTS:   Post TR band instructions given. Pt tolerated well.

## 2018-06-21 ENCOUNTER — Encounter (HOSPITAL_COMMUNITY): Payer: Self-pay | Admitting: Cardiovascular Disease

## 2018-06-21 DIAGNOSIS — I2510 Atherosclerotic heart disease of native coronary artery without angina pectoris: Secondary | ICD-10-CM

## 2018-06-21 LAB — BASIC METABOLIC PANEL
Anion gap: 11 (ref 5–15)
BUN: 11 mg/dL (ref 8–23)
CO2: 24 mmol/L (ref 22–32)
Calcium: 9.1 mg/dL (ref 8.9–10.3)
Chloride: 101 mmol/L (ref 98–111)
Creatinine, Ser: 0.68 mg/dL (ref 0.44–1.00)
GFR calc Af Amer: >60 mL/min (ref >60)
GFR calc non Af Amer: >60 mL/min (ref >60)
Glucose, Bld: 194 mg/dL — ABNORMAL HIGH (ref 70–99)
Potassium: 4 mmol/L (ref 3.5–5.1)
Sodium: 136 mmol/L (ref 135–145)

## 2018-06-21 LAB — GLUCOSE, CAPILLARY
Glucose-Capillary: 288 mg/dL — ABNORMAL HIGH (ref 70–99)
Glucose-Capillary: 247 mg/dL — ABNORMAL HIGH (ref 70–99)
Glucose-Capillary: 276 mg/dL — ABNORMAL HIGH (ref 70–99)
Glucose-Capillary: 222 mg/dL — ABNORMAL HIGH (ref 70–99)

## 2018-06-21 LAB — CBC
HCT: 35.3 % — ABNORMAL LOW (ref 36.0–46.0)
Hemoglobin: 12.3 g/dL (ref 12.0–15.0)
MCH: 32.4 pg (ref 26.0–34.0)
MCHC: 34.8 g/dL (ref 30.0–36.0)
MCV: 92.9 fL (ref 80.0–100.0)
Platelets: 205 10*3/uL (ref 150–400)
RBC: 3.8 MIL/uL — ABNORMAL LOW (ref 3.87–5.11)
RDW: 12.4 % (ref 11.5–15.5)
WBC: 10.8 10*3/uL — ABNORMAL HIGH (ref 4.0–10.5)
nRBC: 0 % (ref 0.0–0.2)

## 2018-06-21 LAB — NOVEL CORONAVIRUS, NAA (HOSP ORDER, SEND-OUT TO REF LAB; TAT 18-24 HRS): SARS-CoV-2, NAA: NOT DETECTED

## 2018-06-21 MED ORDER — NICOTINE 14 MG/24HR TD PT24: 14.0000 mg | MEDICATED_PATCH | Freq: Every day | TRANSDERMAL | 0 refills | Status: DC

## 2018-06-21 MED ORDER — METOPROLOL TARTRATE 25 MG PO TABS: 25.0000 mg | ORAL_TABLET | Freq: Two times a day (BID) | ORAL | 2 refills | Status: DC

## 2018-06-21 MED ORDER — LISINOPRIL 5 MG PO TABS: 5.0000 mg | ORAL_TABLET | Freq: Every day | ORAL | 2 refills | Status: DC

## 2018-06-21 MED ORDER — TICAGRELOR 90 MG PO TABS: 90.0000 mg | ORAL_TABLET | Freq: Two times a day (BID) | ORAL | 2 refills | Status: DC

## 2018-06-21 MED ORDER — NITROGLYCERIN 0.4 MG SL SUBL: 0.4000 mg | SUBLINGUAL_TABLET | SUBLINGUAL | 2 refills | Status: DC | PRN

## 2018-06-21 MED ORDER — ATORVASTATIN CALCIUM 80 MG PO TABS: 80.0000 mg | ORAL_TABLET | Freq: Every day | ORAL | 2 refills | Status: DC

## 2018-06-21 MED FILL — NITROGLYCERIN 0.4 MG TAB SL: 0.4 | 8 days supply | Qty: 25 | Fill #0 | Status: TO

## 2018-06-21 MED FILL — METOPROLOL TARTRATE 25 MG T: 25 | 30 days supply | Qty: 60 | Fill #0 | Status: TO

## 2018-06-21 MED FILL — LISINOPRIL 5 MG TABLET: 5 | 30 days supply | Qty: 30 | Fill #0 | Status: TO

## 2018-06-21 MED FILL — BRILINTA 90 MG TABLET: 90 | 30 days supply | Qty: 60 | Fill #0 | Status: TO

## 2018-06-21 MED FILL — NICOTINE 14 MG/24HR PATCH: 14 | 28 days supply | Qty: 28 | Fill #0

## 2018-06-21 MED FILL — ATORVASTATIN CALCIUM 80 MG: 80 | 30 days supply | Qty: 30 | Fill #0 | Status: TO

## 2018-06-21 NOTE — Discharge Instructions (Signed)
Coronary Angiogram With Stent, Care After  This sheet gives you information about how to care for yourself after your procedure. Your health care provider may also give you more specific instructions. If you have problems or questions, contact your health care provider.  What can I expect after the procedure?  After your procedure, it is common to have:   Bruising in the area where a small, thin tube (catheter) was inserted. This usually fades within 1-2 weeks.   Blood collecting in the tissue (hematoma) that may be painful to the touch. It should usually decrease in size and tenderness within 1-2 weeks.  Follow these instructions at home:  Insertion area care   Do not take baths, swim, or use a hot tub until your health care provider approves.   You may shower 24-48 hours after the procedure or as directed by your health care provider.   Follow instructions from your health care provider about how to take care of your incision. Make sure you:  ? Wash your hands with soap and water before you change your bandage (dressing). If soap and water are not available, use hand sanitizer.  ? Change your dressing as told by your health care provider.  ? Leave stitches (sutures), skin glue, or adhesive strips in place. These skin closures may need to stay in place for 2 weeks or longer. If adhesive strip edges start to loosen and curl up, you may trim the loose edges. Do not remove adhesive strips completely unless your health care provider tells you to do that.   Remove the bandage (dressing) and gently wash the catheter insertion site with plain soap and water.   Pat the area dry with a clean towel. Do not rub the area, because that may cause bleeding.   Do not apply powder or lotion to the incision area.   Check your incision area every day for signs of infection. Check for:  ? More redness, swelling, or pain.  ? More fluid or blood.  ? Warmth.  ? Pus or a bad smell.  Activity   Do not drive for 24 hours if you  were given a medicine to help you relax (sedative).   Do not lift anything that is heavier than 10 lb (4.5 kg) for 5 days after your procedure or as directed by your health care provider.   Ask your health care provider when it is okay for you:  ? To return to work or school.  ? To resume usual physical activities or sports.  ? To resume sexual activity.  Eating and drinking     Eat a heart-healthy diet. This should include plenty of fresh fruits and vegetables.   Avoid the following types of food:  ? Food that is high in salt.  ? Canned or highly processed food.  ? Food that is high in saturated fat or sugar.  ? Fried food.   Limit alcohol intake to no more than 1 drink a day for non-pregnant women and 2 drinks a day for men. One drink equals 12 oz of beer, 5 oz of wine, or 1 oz of hard liquor.  Lifestyle     Do not use any products that contain nicotine or tobacco, such as cigarettes and e-cigarettes. If you need help quitting, ask your health care provider.   Take steps to manage and control your weight.   Get regular exercise.   Manage your blood pressure.   Manage other health problems, such as diabetes.    General instructions   Take over-the-counter and prescription medicines only as told by your health care provider. Blood thinners may be prescribed after your procedure to improve blood flow through the stent.   If you need an MRI after your heart stent has been placed, be sure to tell the health care provider who orders the MRI that you have a heart stent.   Keep all follow-up visits as directed by your health care provider. This is important.  Contact a health care provider if:   You have a fever.   You have chills.   You have increased bleeding from the catheter insertion area. Hold pressure on the area.  Get help right away if:   You develop chest pain or shortness of breath.   You feel faint or you pass out.   You have unusual pain at the catheter insertion area.   You have redness,  warmth, or swelling at the catheter insertion area.   You have drainage (other than a small amount of blood on the dressing) from the catheter insertion area.   The catheter insertion area is bleeding, and the bleeding does not stop after 30 minutes of holding steady pressure on the area.   You develop bleeding from any other place, such as from your rectum. There may be bright red blood in your urine or stool, or it may appear as black, tarry stool.  This information is not intended to replace advice given to you by your health care provider. Make sure you discuss any questions you have with your health care provider.  Document Released: 07/31/2004 Document Revised: 10/09/2015 Document Reviewed: 10/09/2015  Elsevier Interactive Patient Education  2019 [employer]

## 2018-06-21 NOTE — Progress Notes (Signed)
Pt sts she has been ambulating without symptoms. She is feeling well, eager to d/c. Discussed MI, stents, Brilinta/ASA, restrictions, smoking cessation, diet, walking at home, NTG, and CRPII. Will send referral to [place] [facility]. She is thinking about quitting smoking however she feels she will go home and smoke. She understands importance of Brilinta.  [id number] Ethelda Chick CES, ACSM 8:33 AM 06/21/2018

## 2018-06-21 NOTE — TOC Benefit Eligibility Note (Signed)
Transition of Care [facility]) Benefit Eligibility Note    Patient Details  Name: Sarah Francis MRN: [id number] Date of Birth: 1945-11-18   Medication/Dose: BRILINTA  90 MG BID     Tier: 2 Drug  Prescription Coverage Preferred Pharmacy: YES([facility] AND [facility] , 90 DAY SUPPLY FOR M/O $105.00)  Spoke with Person/Company/Phone Number:: KATHY([facility] #  [phone])  Co-Pay: $35.00  Prior Approval: No          Mardene Sayer Phone Number: 06/21/2018, 10:34 AM

## 2018-06-21 NOTE — Discharge Summary (Addendum)
Discharge Summary    Patient ID: Sarah Francis,  MRN: [id number], DOB/AGE: Dec 11, 1945 73 y.o.  Admit date: 06/16/2018 Discharge date: 06/21/2018  Primary Care Provider: Gweneth Dimitri Primary Cardiologist: Dr. Mayford Knife  Discharge Diagnoses    Principal Problem:   NSTEMI (non-ST elevated myocardial infarction) [facility]) Active Problems:   Diabetes with neurologic complications [facility])   Mixed hyperlipidemia   Tobacco use disorder   Chest pain   Hypertension   Coronary artery calcification seen on CAT scan   Heart murmur   Peripheral arterial disease (HCC)   Allergies Allergies  Allergen Reactions  . Penicillins Rash    Diagnostic Studies/Procedures    Cath: 06/20/18  1.  Mild to moderate nonobstructive stenosis in the left main, LAD, and left circumflex distributions 2.  Severe stenosis of the ostium, proximal portion, and midportion of the RCA, treated successfully with complex PCI using overlapping drug-eluting stents in the mid vessel and overlapping drug-eluting stents in the proximal/ostial vessel 3.  Normal LV systolic function by echo with normal LVEDP  Recommendations: Aggressive medical therapy, dual antiplatelet therapy without interruption using aspirin and ticagrelor for a minimum of 12 months.  Diagnostic  Dominance: Right    Intervention     TTE: 06/17/18  IMPRESSIONS    1. The left ventricle has hyperdynamic systolic function, with an ejection fraction of >65%. The cavity size was normal. There is mildly increased left ventricular wall thickness. Left ventricular diastolic Doppler parameters are consistent with  impaired relaxation.  2. The right ventricle has normal systolic function. The cavity was normal. There is no increase in right ventricular wall thickness.  3. The aortic root and ascending aorta are normal in size and structure.  4. The interatrial septum was not assessed. _____________   History of Present Illness     73 year old woman  with DMII, HTN, PAD, strong family history of CAD and ongoing tobacco abuse who presented with NSTEMI.   The patient reported that for approximately the last week prior to admission she had been extremely fatigued. She attributed this to poor sleep and did not note any other symptoms until the night prior to admission. She reported that at approximately 11pm she woke up with severe chest pressure that radiated to her left arm and shoulder blades. Pain lasted for approximately 2 hours and then resolved spontaneously. She has a pulse ox at home that she recently purchased and she checked her HR at the time which showed a pulse in the 170s, although she did not feel any palpitations. She went back to bed and called her PCP the following morningwho advised she present to the emergency department.   In the ED, the patient was hemodynamically stable and ECG without any acute ischemic changes. Laboratory evaluation revealed troponin 0.74 on initial check. At the time of evaluation, she denied any further episodes of chest pain. No recent chest pain on exertion at home but did note that she had recently started taking omeprazole due to chest discomfort that she attributed to indigestion. No shortness of breath, nausea, diaphoresis, syncope, palpitations or LEE. She continues to smoke daily.   Given symptoms she was admitted with plans to undergo cardiac cath.   Hospital Course     Troponin peaked at 0.74. Symptoms resolved while on IV heparin. Echo showed normal EF with no rWMA. She was started on metoprolol with blood pressures tolerating. Home lisinopril was titrated from 2.5mg  to  daily. Hgb A1c noted at 8.3 and seen  by the diabetes educator this admission. SSI used while inpatient. Underwent cardiac cath noted above with severe stenosis of the ostium, p/mRCA with successful PCI with overlapping stents. Mild to moderate nonobstructive disease 40% LM,  60% dLAD, 50% pLCx to be treated with aggressive  medical therapy. Plan for DAPT with ASA/Brilinta for at least one year. Statin was increased from atrovastatin 40->80mg . No recurrent chest pain post cath. Worked well with cardiac rehab. Discussed the need for tobacco cessation. She plans to use nicotine patches at the time of discharge and was very motivated to quit.   General: Well developed, well nourished, female appearing in no acute distress. Head: Normocephalic, atraumatic.  Neck: Supple without bruits, JV. Lungs:  Resp regular and unlabored, CTA. Heart: RRR, S1, S2, no S3, S4, + murmur; no rub. Abdomen: Soft, non-tender, non-distended with normoactive bowel sounds. No hepatomegaly. No rebound/guarding. No obvious abdominal masses. Extremities: No clubbing, cyanosis, edema. Distal pedal pulses are 2+ bilaterally. Right radial cath site stable with bruising but no hematoma Neuro: Alert and oriented X 3. Moves all extremities spontaneously. Psych: Normal affect.  Sarah Francis was seen by Dr. Swazil[place] and determined stable for discharge home. Follow up in the office has been arranged. Medications are listed below.   _____________  Discharge Vitals Blood pressure (!) 160/67, pulse 82, temperature (!) 97.5 F (36.4 C), temperature source Oral, resp. rate 20, height  (1.549 m), weight 72.5 kg, SpO2 99 %.  Filed Weights   06/19/18 0527 06/20/18 0421 06/21/18 0507  Weight: 71.9 kg 72.6 kg 72.5 kg    Labs & Radiologic Studies    CBC Recent Labs    06/20/18 0418 06/21/18 0410  WBC 9.2 10.8*  HGB 13.1 12.3  HCT 36.9 35.3*  MCV 92.3 92.9  PLT 200 205   Basic Metabolic Panel Recent Labs    40/98/11 1147 06/21/18 0410  NA 134* 136  K 4.1 4.0  CL 98 101  CO2 24 24  GLUCOSE 165* 194*  BUN 13 11  CREATININE 0.73 0.68  CALCIUM 9.4 9.1   Liver Function Tests No results for input(s): AST, ALT, ALKPHOS, BILITOT, PROT, ALBUMIN in the last 72 hours. No results for input(s): LIPASE, AMYLASE in the last 72 hours. Cardiac  Enzymes No results for input(s): CKTOTAL, CKMB, CKMBINDEX, TROPONINI in the last 72 hours. BNP Invalid input(s): POCBNP D-Dimer No results for input(s): DDIMER in the last 72 hours. Hemoglobin A1C No results for input(s): HGBA1C in the last 72 hours. Fasting Lipid Panel No results for input(s): CHOL, HDL, LDLCALC, TRIG, CHOLHDL, LDLDIRECT in the last 72 hours. Thyroid Function Tests No results for input(s): TSH, T4TOTAL, T3FREE, THYROIDAB in the last 72 hours.  Invalid input(s): FREET3 _____________  Dg Chest 2 View  Result Date: 06/16/2018 CLINICAL DATA:  Chest discomfort and shortness of breath EXAM: CHEST - 2 VIEW COMPARISON:  Chest radiograph November 02, 2013 and chest CT March 21, 2017 FINDINGS: There is atelectatic change in the left base. There is no edema or consolidation. Heart size and pulmonary vascularity are normal. No adenopathy. There is aortic atherosclerosis. No bone lesions. IMPRESSION: Left base atelectasis. No edema or consolidation. Heart size within normal limits. Aortic Atherosclerosis (ICD10-I70.0). Electronically Signed   By: Bretta Bang III M.D.   On: 06/16/2018 18:08   Disposition   Pt is being discharged home today in good condition.  Follow-up Plans & Appointments    Follow-up Information    Dyann Kief, PA-C Follow up on 06/28/2018.  Specialty:  Cardiology Why:  at 1pm for your follow up appt. This will be a virtual visit done through your mobile phone.  Contact information: [address] [place] [address] [phone]        Gweneth DimitriMcNeill, Wendy, MD Follow up.   Specialty:  Family Medicine Why:  Please keep close follow up with your PCP regarding diabetes management.  Contact information: [address] [place] [address] [phone]          Discharge Instructions    Amb Referral to Cardiac Rehabilitation   Complete by:  As directed    Diagnosis:   Coronary Stents NSTEMI PTCA     After initial  evaluation and assessments completed: Virtual Based Care may be provided alone or in conjunction with Phase 2 Cardiac Rehab based on patient barriers.:  Yes   Call MD for:  redness, tenderness, or signs of infection (pain, swelling, redness, odor or green/yellow discharge around incision site)   Complete by:  As directed    Diet - low sodium heart healthy   Complete by:  As directed    Discharge instructions   Complete by:  As directed    Radial Site Care Refer to this sheet in the next few weeks. These instructions provide you with information on caring for yourself after your procedure. Your caregiver may also give you more specific instructions. Your treatment has been planned according to current medical practices, but problems sometimes occur. Call your caregiver if you have any problems or questions after your procedure. HOME CARE INSTRUCTIONS You may shower the day after the procedure.Remove the bandage (dressing) and gently wash the site with plain soap and water.Gently pat the site dry.  Do not apply powder or lotion to the site.  Do not submerge the affected site in water for 3 to 5 days.  Inspect the site at least twice daily.  Do not flex or bend the affected arm for 24 hours.  No lifting over 5 pounds (2.3 kg) for 5 days after your procedure.  Do not drive home if you are discharged the same day of the procedure. Have someone else drive you.  You may drive 24 hours after the procedure unless otherwise instructed by your caregiver.  What to expect: Any bruising will usually fade within 1 to 2 weeks.  Blood that collects in the tissue (hematoma) may be painful to the touch. It should usually decrease in size and tenderness within 1 to 2 weeks.  SEEK IMMEDIATE MEDICAL CARE IF: You have unusual pain at the radial site.  You have redness, warmth, swelling, or pain at the radial site.  You have drainage (other than a small amount of blood on the dressing).  You have chills.  You  have a fever or persistent symptoms for more than 72 hours.  You have a fever and your symptoms suddenly get worse.  Your arm becomes pale, cool, tingly, or numb.  You have heavy bleeding from the site. Hold pressure on the site.   PLEASE DO NOT MISS ANY DOSES OF YOUR BRILINTA!!!!! Also keep a log of you blood pressures and bring back to your follow up appt. Please call the office with any questions.   Patients taking blood thinners should generally stay away from medicines like ibuprofen, Advil, Motrin, naproxen, and Aleve due to risk of stomach bleeding. You may take Tylenol as directed or talk to your primary doctor about alternatives.   Increase activity slowly  Complete by:  As directed       Discharge Medications     Medication List    TAKE these medications   aspirin EC 81 MG tablet Take 81 mg by mouth daily.   atorvastatin 80 MG tablet Commonly known as:  LIPITOR Take 1 tablet (80 mg total) by mouth daily at 6 PM. What changed:    medication strength  how much to take  when to take this   cetirizine 10 MG tablet Commonly known as:  ZYRTEC Take 10 mg by mouth every morning.   cholecalciferol 25 MCG (1000 UT) tablet Commonly known as:  VITAMIN D3 Take 2,000 Units by mouth daily.   cycloSPORINE 0.05 % ophthalmic emulsion Commonly known as:  RESTASIS Place 1 drop into both eyes 2 (two) times daily.   HUMALOG KWIKPEN [place] Inject 8-12 Units into the skin 3 (three) times daily. 8 units with breakfast, 10 units with lunch and 12 units with dinner   HYDROcodone-acetaminophen 5-325 MG tablet Commonly known as:  NORCO/VICODIN Take 1-2 tablets by mouth every 6 (six) hours as needed for moderate pain.   hydrOXYzine 25 MG tablet Commonly known as:  ATARAX/VISTARIL Take 1 tablet (25 mg total) by mouth every 6 (six) hours as needed for itching.   Lantus SoloStar 100 UNIT/ML Solostar Pen Generic drug:  Insulin Glargine Inject 15-30 Units into the skin 2 (two) times  daily. 15 units in the morning and 30 units in the evening   lisinopril 5 MG tablet Commonly known as:  ZESTRIL Take 1 tablet (5 mg total) by mouth daily. Start taking on:  Jun 22, 2018 What changed:    medication strength  how much to take   metoprolol tartrate 25 MG tablet Commonly known as:  LOPRESSOR Take 1 tablet (25 mg total) by mouth 2 (two) times daily.   nicotine 14 mg/24hr patch Commonly known as:  NICODERM CQ - dosed in mg/24 hours Place 1 patch (14 mg total) onto the skin daily. Start taking on:  Jun 22, 2018   nitroGLYCERIN 0.4 MG SL tablet Commonly known as:  NITROSTAT Place 1 tablet (0.4 mg total) under the tongue every 5 (five) minutes x 3 doses as needed for chest pain.   omeprazole 20 MG capsule Commonly known as:  PRILOSEC Take 20 mg by mouth daily.   ranitidine 150 MG tablet Commonly known as:  ZANTAC Take 150 mg by mouth 2 (two) times daily as needed for heartburn.   ticagrelor 90 MG Tabs tablet Commonly known as:  BRILINTA Take 1 tablet (90 mg total) by mouth 2 (two) times daily.        Acute coronary syndrome (MI, NSTEMI, STEMI, etc) this admission?: Yes.     AHA/ACC Clinical Performance & Quality Measures: 1. Aspirin prescribed? - Yes 2. ADP Receptor Inhibitor (Plavix/Clopidogrel, Brilinta/Ticagrelor or Effient/Prasugrel) prescribed (includes medically managed patients)? - Yes 3. Beta Blocker prescribed? - Yes 4. High Intensity Statin (Lipitor 40-80mg  or Crestor 20-40mg ) prescribed? - Yes 5. EF assessed during THIS hospitalization? - Yes 6. For EF <40%, was ACEI/ARB prescribed? - Not Applicable (EF >/= 40%) 7. For EF <40%, Aldosterone Antagonist (Spironolactone or Eplerenone) prescribed? - Not Applicable (EF >/= 40%) 8. Cardiac Rehab Phase II ordered (Included Medically managed Patients)? - Yes     Outstanding Labs/Studies   FLP/LFTs in 6 weeks if tolerating statin increase.   Duration of Discharge Encounter   Greater than 30 minutes  including physician time.  Signed, Laverda Page NP-C 06/21/2018,  11:17 AM   Patient seen and examined and history reviewed. Agree with above findings and plan. Feels well today. No angina. She does have significant bruising of right forearm but it is soft and pulse is good. VSS. Lungs are clear. She is stable for DC today. She is going to try and quit smoking with nicotine patches. Follow up arranged for one week.  Jontae Adebayo [place], MDFACC 06/21/2018 11:20 AM

## 2018-06-27 ENCOUNTER — Telehealth: Payer: Self-pay

## 2018-06-27 NOTE — Telephone Encounter (Signed)
Left message for patient to call back regarding her appointment with Jacolyn Reedy, PA tomorrow. Need to obtain consent and set up for Video with [employer].

## 2018-06-27 NOTE — Telephone Encounter (Signed)
Virtual Visit Pre-Appointment Phone Call  TELEPHONE CALL NOTE  Yusra Chavers has been deemed a candidate for a follow-up tele-health visit to limit community exposure during the Covid-19 pandemic. I spoke with the patient via phone to ensure availability of phone/video source, confirm preferred email & phone number, and discuss instructions and expectations.  I reminded Kaidance Joaquim to be prepared with any vital sign and/or heart rhythm information that could potentially be obtained via home monitoring, at the time of her visit. I reminded Dorys Steinert to expect a phone call prior to her visit.  Patient agrees to consent below.  Lattie Haw, RN 06/27/2018 10:37 AM    FULL LENGTH CONSENT FOR TELE-HEALTH VISIT   I hereby voluntarily request, consent and authorize [facility] and its employed or contracted physicians, physician assistants, nurse practitioners or other licensed health care professionals (the Practitioner), to provide me with telemedicine health care services (the "Services") as deemed necessary by the treating Practitioner. I acknowledge and consent to receive the Services by the Practitioner via telemedicine. I understand that the telemedicine visit will involve communicating with the Practitioner through live audiovisual communication technology and the disclosure of certain medical information by electronic transmission. I acknowledge that I have been given the opportunity to request an in-person assessment or other available alternative prior to the telemedicine visit and am voluntarily participating in the telemedicine visit.  I understand that I have the right to withhold or withdraw my consent to the use of telemedicine in the course of my care at any time, without affecting my right to future care or treatment, and that the Practitioner or I may terminate the telemedicine visit at any time. I understand that I have the right to inspect all information obtained  and/or recorded in the course of the telemedicine visit and may receive copies of available information for a reasonable fee.  I understand that some of the potential risks of receiving the Services via telemedicine include:  Marland Kitchen Delay or interruption in medical evaluation due to technological equipment failure or disruption; . Information transmitted may not be sufficient (e.g. poor resolution of images) to allow for appropriate medical decision making by the Practitioner; and/or  . In rare instances, security protocols could fail, causing a breach of personal health information.  Furthermore, I acknowledge that it is my responsibility to provide information about my medical history, conditions and care that is complete and accurate to the best of my ability. I acknowledge that Practitioner's advice, recommendations, and/or decision may be based on factors not within their control, such as incomplete or inaccurate data provided by me or distortions of diagnostic images or specimens that may result from electronic transmissions. I understand that the practice of medicine is not an exact science and that Practitioner makes no warranties or guarantees regarding treatment outcomes. I acknowledge that I will receive a copy of this consent concurrently upon execution via email to the email address I last provided but may also request a printed copy by calling the office of [facility].    I understand that my insurance will be billed for this visit.   I have read or had this consent read to me. . I understand the contents of this consent, which adequately explains the benefits and risks of the Services being provided via telemedicine.  . I have been provided ample opportunity to ask questions regarding this consent and the Services and have had my questions answered to my satisfaction. . I give my  informed consent for the services to be provided through the use of telemedicine in my medical care  By  participating in this telemedicine visit I agree to the above.

## 2018-06-28 ENCOUNTER — Telehealth (INDEPENDENT_AMBULATORY_CARE_PROVIDER_SITE_OTHER): Payer: Medicare Other | Admitting: Physician Assistant

## 2018-06-28 ENCOUNTER — Encounter: Payer: Self-pay | Admitting: Physician Assistant

## 2018-06-28 ENCOUNTER — Other Ambulatory Visit: Payer: Self-pay

## 2018-06-28 VITALS — Ht 61.0 in | Wt 160.0 lb

## 2018-06-28 DIAGNOSIS — I10 Essential (primary) hypertension: Secondary | ICD-10-CM | POA: Diagnosis not present

## 2018-06-28 DIAGNOSIS — I2510 Atherosclerotic heart disease of native coronary artery without angina pectoris: Secondary | ICD-10-CM

## 2018-06-28 DIAGNOSIS — F17200 Nicotine dependence, unspecified, uncomplicated: Secondary | ICD-10-CM

## 2018-06-28 DIAGNOSIS — E782 Mixed hyperlipidemia: Secondary | ICD-10-CM

## 2018-06-28 DIAGNOSIS — Z8249 Family history of ischemic heart disease and other diseases of the circulatory system: Secondary | ICD-10-CM

## 2018-06-28 MED ORDER — NICOTINE 21 MG/24HR TD PT24: 21.0000 mg | MEDICATED_PATCH | Freq: Every day | TRANSDERMAL | 0 refills | Status: DC

## 2018-06-28 NOTE — Progress Notes (Signed)
Virtual Visit via Video Note   This visit type was conducted due to national recommendations for restrictions regarding the COVID-19 Pandemic (e.g. social distancing) in an effort to limit this patient's exposure and mitigate transmission in our community.  Due to her co-morbid illnesses, this patient is at least at moderate risk for complications without adequate follow up.  This format is felt to be most appropriate for this patient at this time.  All issues noted in this document were discussed and addressed.  A limited physical exam was performed with this format.  Please refer to the patient's chart for her consent to telehealth for [facility].   Converted to phone visit after patient was unable to connect.  Date:  06/28/2018   ID:  Sarah Francis, DOB 11-Nov-1945, MRN [id number]  Patient Location: Home Provider Location: Home  PCP:  Gweneth Dimitri, MD  Cardiologist:  Armanda Magic, MD  Electrophysiologist:  None   Evaluation Performed:  Follow-Up Visit  Chief Complaint:  Post hospital f/u  History of Present Illness:    Sarah Francis is a 73 y.o. female with history of HTN, PAD, tobacco abuse, strong family of CAD.  Patient had coronary CT in 2018 with elevated calcium score over thousand 98 percentile and three-vessel coronary calcification.  Nuclear stress test showed no ischemia.  He was treated medically at that time.  Patient presented to the hospital 06/20/2018 with NSTEMI treated with overlapping stent in the proximal and mid RCA with residual mild to moderate nonobstructive disease 40% left main, 60% distal LAD 50% proximal circumflex to be treated medically with Aspirin and Brilinta for 1 year and atorvastatin increased to 80 mg daily.  2D echo showed normal LVEF 65% normal wall motion abnormality.  Patient complains of trying to take a deep breath sometimes but getting better. Hasn't been using caffeine before taking brilinta. Also fatigue. Trying to quit smoking but still 1/2  ppd.Using nicotine patches while smoking.  The patient does not have symptoms concerning for COVID-19 infection (fever, chills, cough, or new shortness of breath).    Past Medical History:  Diagnosis Date  . Anxiety   . Aortic atherosclerosis (HCC)   . Coronary artery calcification seen on CAT scan 05/17/2016  . Diabetes mellitus without complication (HCC)   . GERD (gastroesophageal reflux disease)   . Hypertension   . Major depression    initial diagnosis/medication around age 85  . NSTEMI (non-ST elevated myocardial infarction) (HCC)    06/20/18 PCI/DES overlapping stents to p/m RCA, mild disease in LAD, and Lcx  . Osteoporosis    w h/o sacral fracture   Past Surgical History:  Procedure Laterality Date  . ABDOMINAL HYSTERECTOMY    . CORONARY STENT INTERVENTION N/A 06/20/2018   Procedure: CORONARY STENT INTERVENTION;  Surgeon: Tonny Bollman, MD;  Location: [facility] INVASIVE CV LAB;  Service: Cardiovascular;  Laterality: N/A;  . KNEE SURGERY Right 2012  . LEFT HEART CATH AND CORONARY ANGIOGRAPHY N/A 06/20/2018   Procedure: LEFT HEART CATH AND CORONARY ANGIOGRAPHY;  Surgeon: Tonny Bollman, MD;  Location: [facility] INVASIVE CV LAB;  Service: Cardiovascular;  Laterality: N/A;     Current Meds  Medication Sig  . aspirin EC 81 MG tablet Take 81 mg by mouth daily.  Marland Kitchen atorvastatin (LIPITOR) 80 MG tablet Take 1 tablet (80 mg total) by mouth daily at 6 PM.  . cetirizine (ZYRTEC) 10 MG tablet Take 10 mg by mouth every morning.  . cholecalciferol (VITAMIN D3) 25 MCG (1000 UT) tablet Take  2,000 Units by mouth daily.  . cycloSPORINE (RESTASIS) 0.05 % ophthalmic emulsion Place 1 drop into both eyes 2 (two) times daily.  Marland Kitchen. HYDROcodone-acetaminophen (NORCO/VICODIN) 5-325 MG tablet Take 1-2 tablets by mouth every 6 (six) hours as needed for moderate pain.  . Insulin Glargine (LANTUS SOLOSTAR) 100 UNIT/ML Solostar Pen Inject 15-30 Units into the skin 2 (two) times daily. 15 units in the morning and 30 units  in the evening  . Insulin Lispro (HUMALOG KWIKPEN [place]) Inject 8-12 Units into the skin 3 (three) times daily. 8 units with breakfast, 10 units with lunch and 12 units with dinner  . lisinopril (ZESTRIL) 5 MG tablet Take 1 tablet (5 mg total) by mouth daily.  . metoprolol tartrate (LOPRESSOR) 25 MG tablet Take 1 tablet (25 mg total) by mouth 2 (two) times daily.  . nicotine (NICODERM CQ - DOSED IN MG/24 HOURS) 14 mg/24hr patch Place 1 patch (14 mg total) onto the skin daily.  . nitroGLYCERIN (NITROSTAT) 0.4 MG SL tablet Place 1 tablet (0.4 mg total) under the tongue every 5 (five) minutes x 3 doses as needed for chest pain.  Marland Kitchen. omeprazole (PRILOSEC) 20 MG capsule Take 20 mg by mouth daily.  . ranitidine (ZANTAC) 150 MG tablet Take 150 mg by mouth 2 (two) times daily as needed for heartburn.  . ticagrelor (BRILINTA) 90 MG TABS tablet Take 1 tablet (90 mg total) by mouth 2 (two) times daily.     Allergies:   Penicillins   Social History   Tobacco Use  . Smoking status: Current Every Day Smoker    Packs/day: 1.00    Years: 88.00    Pack years: 88.00  . Smokeless tobacco: Never Used  . Tobacco comment: Currently smoker 1ppd  Substance Use Topics  . Alcohol use: Yes    Comment: 1-2 glasses of Wine per day  . Drug use: No     Family Hx: The patient's family history includes AAA (abdominal aortic aneurysm) in her father; Cancer in her brother; Diabetes in her brother; Heart attack in her mother; Heart disease in her mother; Hypertension in her sister.  ROS:   Please see the history of present illness.     Review of Systems  Constitution: Negative.  HENT: Negative.   Eyes: Negative.   Cardiovascular: Negative.   Respiratory: Negative.   Hematologic/Lymphatic: Negative.   Musculoskeletal: Negative.  Negative for joint pain.  Gastrointestinal: Negative.   Genitourinary: Negative.   Neurological: Negative.     All other systems reviewed and are negative.   Prior CV studies:   The  following studies were reviewed today:  Cath: 06/20/18   1.  Mild to moderate nonobstructive stenosis in the left main, LAD, and left circumflex distributions 2.  Severe stenosis of the ostium, proximal portion, and midportion of the RCA, treated successfully with complex PCI using overlapping drug-eluting stents in the mid vessel and overlapping drug-eluting stents in the proximal/ostial vessel 3.  Normal LV systolic function by echo with normal LVEDP   Recommendations: Aggressive medical therapy, dual antiplatelet therapy without interruption using aspirin and ticagrelor for a minimum of 12 months.   TTE: 06/17/18   IMPRESSIONS      1. The left ventricle has hyperdynamic systolic function, with an ejection fraction of >65%. The cavity size was normal. There is mildly increased left ventricular wall thickness. Left ventricular diastolic Doppler parameters are consistent with  impaired relaxation.  2. The right ventricle has normal systolic function. The cavity was normal.  There is no increase in right ventricular wall thickness.  3. The aortic root and ascending aorta are normal in size and structure.  4. The interatrial septum was not assessed. _____________   Labs/Other Tests and Data Reviewed:    EKG:  An ECG dated 06/21/18 was personally reviewed today and demonstrated:  NSR with LVH and TWI laterally  Recent Labs: 06/16/2018: TSH 1.288 06/21/2018: BUN 11; Creatinine, Ser 0.68; Hemoglobin 12.3; Platelets 205; Potassium 4.0; Sodium 136   Recent Lipid Panel Lab Results  Component Value Date/Time   CHOL 137 06/17/2018 03:57 AM   TRIG 211 (H) 06/17/2018 03:57 AM   HDL 35 (L) 06/17/2018 03:57 AM   CHOLHDL 3.9 06/17/2018 03:57 AM   LDLCALC 60 06/17/2018 03:57 AM    Wt Readings from Last 3 Encounters:  06/28/18 160 lb (72.6 kg)  06/21/18 159 lb 13.3 oz (72.5 kg)  06/21/17 150 lb (68 kg)     Objective:    Vital Signs:  Ht  (1.549 m)   Wt 160 lb (72.6 kg)   BMI 30.23  kg/m    VITAL SIGNS:  reviewed patient says cath site is bruised and healing well. No hard knots  ASSESSMENT & PLAN:    1. CAD status post NSTEMI stents to the proximal mid RCA with residual CAD discussed above to manage medically.  On Brilinta and aspirin, atorvastatin increased to 80 mg daily no angina 2. Essential hypertension would like a BP cuff-will order 3. Hyperlipidemia atorvastatin increased to 80 mg daily.  Plan to check fasting lipid panel and LFTs in 3 months. 4. Tobacco abuse  Smoking 1/2 ppd with 14 mg nicotine patch on. Will call in 21 mg patch and advised her not to smoke with the patch on. 5. Strong family history of CAD  COVID-19 Education: The signs and symptoms of COVID-19 were discussed with the patient and how to seek care for testing (follow up with PCP or arrange E-visit).   The importance of social distancing was discussed today.  Time:   Today, I have spent 18:45 minutes with the patient with telehealth technology discussing the above problems. 10 min spent reviewing cath report and records.    Medication Adjustments/Labs and Tests Ordered: Current medicines are reviewed at length with the patient today.  Concerns regarding medicines are outlined above.   Tests Ordered: No orders of the defined types were placed in this encounter.   Medication Changes: No orders of the defined types were placed in this encounter.   Disposition:  Follow up in 2 month(s) Dr. Mayford Knife  Signed, Jacolyn Reedy, PA-C  06/28/2018 1:14 PM    [facility]

## 2018-06-28 NOTE — Patient Instructions (Addendum)
Medication Instructions:  Your physician recommends that you continue on your current medications as directed. Please refer to the Current Medication list given to you today.  21 mg nicotine patches have been sent to your pharmacy. Use as directed and do not smoke while wearing the patch.  If you need a refill on your cardiac medications before your next appointment, please call your pharmacy.   Lab work: Your physician recommends that you return for a FASTING lipid profile and liver function panel on 09/13/18  If you have labs (blood work) drawn today and your tests are completely normal, you will receive your results only by: Marland Kitchen. MyChart Message (if you have MyChart) OR . A paper copy in the mail If you have any lab test that is abnormal or we need to change your treatment, we will call you to review the results.  Testing/Procedures: None ordered  Follow-Up: . Follow up with Dr. Mayford Knifeurner on 09/13/18 at 11:40 AM  Any Other Special Instructions Will Be Listed Below (If Applicable).   Steps to Quit Smoking  Smoking tobacco can be bad for your health. It can also affect almost every organ in your body. Smoking puts you and people around you at risk for many serious long-lasting (chronic) diseases. Quitting smoking is hard, but it is one of the best things that you can do for your health. It is never too late to quit. What are the benefits of quitting smoking? When you quit smoking, you lower your risk for getting serious diseases and conditions. They can include:  Lung cancer or lung disease.  Heart disease.  Stroke.  Heart attack.  Not being able to have children (infertility).  Weak bones (osteoporosis) and broken bones (fractures). If you have coughing, wheezing, and shortness of breath, those symptoms may get better when you quit. You may also get sick less often. If you are pregnant, quitting smoking can help to lower your chances of having a baby of low birth weight. What can  I do to help me quit smoking? Talk with your doctor about what can help you quit smoking. Some things you can do (strategies) include:  Quitting smoking totally, instead of slowly cutting back how much you smoke over a period of time.  Going to in-person counseling. You are more likely to quit if you go to many counseling sessions.  Using resources and support systems, such as: ? Agricultural engineernline chats with a Veterinary surgeoncounselor. ? Phone quitlines. ? Automotive engineerrinted self-help materials. ? Support groups or group counseling. ? Text messaging programs. ? Mobile phone apps or applications.  Taking medicines. Some of these medicines may have nicotine in them. If you are pregnant or breastfeeding, do not take any medicines to quit smoking unless your doctor says it is okay. Talk with your doctor about counseling or other things that can help you. Talk with your doctor about using more than one strategy at the same time, such as taking medicines while you are also going to in-person counseling. This can help make quitting easier. What things can I do to make it easier to quit? Quitting smoking might feel very hard at first, but there is a lot that you can do to make it easier. Take these steps:  Talk to your family and friends. Ask them to support and encourage you.  Call phone quitlines, reach out to support groups, or work with a Veterinary surgeoncounselor.  Ask people who smoke to not smoke around you.  Avoid places that make you want (trigger)  to smoke, such as: ? Bars. ? Parties. ? Smoke-break areas at work.  Spend time with people who do not smoke.  Lower the stress in your life. Stress can make you want to smoke. Try these things to help your stress: ? Getting regular exercise. ? Deep-breathing exercises. ? Yoga. ? Meditating. ? Doing a body scan. To do this, close your eyes, focus on one area of your body at a time from head to toe, and notice which parts of your body are tense. Try to relax the muscles in those  areas.  Download or buy apps on your mobile phone or tablet that can help you stick to your quit plan. There are many free apps, such as QuitGuide from the [employer] developer for Disease Control and Prevention). You can find more support from smokefree.gov and other websites. This information is not intended to replace advice given to you by your health care provider. Make sure you discuss any questions you have with your health care provider. Document Released: 11/07/2008 Document Revised: 09/09/2015 Document Reviewed: 05/28/2014 Elsevier Interactive Patient Education  2019 [employer].

## 2018-06-29 ENCOUNTER — Telehealth (HOSPITAL_COMMUNITY): Payer: Self-pay | Admitting: *Deleted

## 2018-06-29 NOTE — Telephone Encounter (Signed)
Called and spoke to pt regarding cardiac rehab. Pt aware of continued closure due to covid-19. Pt is not exercising and was not active prior to her cardiac event.  Pt remarked that she has gained 10 pounds in two months due to staying in the home more than usual due to recommendations and also cutting back on smoking. Will send pt educational information regarding chair exercises, heart healthy eating, diabetes and tobacco cessation. Advised pt that we now have the option of virtual cardiac rehab at no cost to her. Pt is interested in Virtual Cardiac Rehab.   Checklist: 1. Pt has smart device  ie smartphone and/or ipad for downloading an app  YES 2. Reliable internet/wifi service   YES 3. Understands how to use his smartphone and navigate within an app.   YES Reviewed with pt the scheduling process for virtual cardiac rehab.  Pt verbalized understanding. Alanson Aly, BSN Cardiac and Emergency planning/management officer

## 2018-06-30 ENCOUNTER — Telehealth (HOSPITAL_COMMUNITY): Payer: Self-pay | Admitting: *Deleted

## 2018-06-30 NOTE — Telephone Encounter (Signed)
Open in error. Carlette Carlton RN, BSN Cardiac and Pulmonary Rehab Nurse Navigator   

## 2018-06-30 NOTE — Telephone Encounter (Signed)
Contacted pt for telephone consent             Confirm Consent - In the setting of the current Covid19 crisis, you are scheduled for a phone visit with your Cardiac or Pulmonary team member.  Just as we do with many in-gym visits, in order for you to participate in this visit, we must obtain consent.  If you'd like, I can send this to your mychart (if signed up) or email for you to review.  Otherwise, I can obtain your verbal consent now.  By agreeing to a telephone visit, we'd like you to understand that the technology does not allow for your Cardiac or Pulmonary Rehab team member to perform a physical assessment, and thus may limit their ability to fully assess your ability to perform exercise programs. If your provider identifies any concerns that need to be evaluated in person, we will make arrangements to do so.  Finally, though the technology is pretty good, we cannot assure that it will always work on either your or our end and we cannot ensure that we have a secure connection.  Cardiac and Pulmonary Rehab Telehealth visits and "At Home" cardiac and pulmonary rehab are provided at no cost to you.               Are you willing to proceed?" STAFF: Did the patient verbally acknowledge consent to telehealth visit? Document YES/NO here: YES      Karlene Lineman RN, BSN Cardiac and Pulmonary Rehab Nurse Navigator    Cardiac and Pulmonary Rehab Staff   06/30/18 1021

## 2018-07-03 ENCOUNTER — Encounter (HOSPITAL_COMMUNITY)
Admission: RE | Admit: 2018-07-03 | Discharge: 2018-07-03 | Disposition: A | Discharge status: Home / Self Care | Payer: Self-pay | Source: Ambulatory Visit | Attending: Cardiology | Admitting: Cardiology

## 2018-07-03 NOTE — Progress Notes (Signed)
Called and spoke to pt regarding Virtual Cardiac Rehab.  Pt  was able to download the [employer] app on their smart device with no issues. Pt set up their account and received the following welcome message -"Welcome to the [facility] program. We hope that you will find the exercise program beneficial in your recovery process. Our staff is available to assist with any questions/concerns about your exercise routine. Best wishes". Brief orientation provided to with the advisement to watch the "Intro to Rehab" series located under the Resource tab. Pt verbalized understanding. Will continue to follow and monitor pt progress with feedback as needed. 

## 2018-07-12 ENCOUNTER — Telehealth: Payer: Self-pay | Admitting: Cardiology

## 2018-07-12 DIAGNOSIS — I2510 Atherosclerotic heart disease of native coronary artery without angina pectoris: Secondary | ICD-10-CM

## 2018-07-12 DIAGNOSIS — I10 Essential (primary) hypertension: Secondary | ICD-10-CM

## 2018-07-12 MED ORDER — LISINOPRIL 20 MG PO TABS: 20.0000 mg | ORAL_TABLET | Freq: Every day | ORAL | 3 refills | Status: DC

## 2018-07-12 NOTE — Telephone Encounter (Signed)
New Message     Pt c/o BP issue: STAT if pt c/o blurred vision, one-sided weakness or slurred speech  1. What are your last 5 BP readings? Sunday 214/88  1pm Tuesday  210/91  5pm Tuesday  179/78  this morning 176/89   2. Are you having any other symptoms (ex. Dizziness, headache, blurred vision, passed out)? She just seems tired and took a nap 2 times yesterday   3. What is your BP issue? Pt got a new bp cuff and is needing some information

## 2018-07-12 NOTE — Telephone Encounter (Signed)
Spoke with the patient, she expressed understanding about lab work and will send blood pressure readings.

## 2018-07-12 NOTE — Telephone Encounter (Signed)
Have her check BP daily for a week 3 hours after her meds and call with results in 1 week and also check BMET in 1 week

## 2018-07-12 NOTE — Telephone Encounter (Signed)
Spoke with the patient her blood pressures have been up, she also has been complaining of fatigue. She took her blood pressure while I was on the phone, it was 186/90.   Spoke with Dr. Burt Knack (DOD), he increase her Lisinopril to 20 mg, daily. He also stated her fatigue could be related to her recovering from her cath. Spoke with the patient she expressed understanding. Advised her to continue to check her blood pressure.

## 2018-07-14 ENCOUNTER — Telehealth (HOSPITAL_COMMUNITY): Payer: Self-pay

## 2018-07-14 NOTE — Telephone Encounter (Signed)
Phone call to Pt to inquire about the virtual CR app. Pt did not answer. Pt has not been logging exercise in the app. Message was left for Pt to return call.

## 2018-07-20 ENCOUNTER — Other Ambulatory Visit: Payer: Self-pay

## 2018-07-20 ENCOUNTER — Other Ambulatory Visit: Payer: Medicare Other | Admitting: *Deleted

## 2018-07-20 DIAGNOSIS — I2510 Atherosclerotic heart disease of native coronary artery without angina pectoris: Secondary | ICD-10-CM

## 2018-07-20 DIAGNOSIS — I10 Essential (primary) hypertension: Secondary | ICD-10-CM

## 2018-07-21 LAB — BASIC METABOLIC PANEL
BUN/Creatinine Ratio: 14 (ref 12–28)
BUN: 9 mg/dL (ref 8–27)
CO2: 22 mmol/L (ref 20–29)
Calcium: 9.2 mg/dL (ref 8.7–10.3)
Chloride: 99 mmol/L (ref 96–106)
Creatinine, Ser: 0.65 mg/dL (ref 0.57–1.00)
GFR calc Af Amer: 103 mL/min/{1.73_m2} (ref >59)
GFR calc non Af Amer: 90 mL/min/{1.73_m2} (ref >59)
Glucose: 150 mg/dL — ABNORMAL HIGH (ref 65–99)
Potassium: 4.2 mmol/L (ref 3.5–5.2)
Sodium: 136 mmol/L (ref 134–144)

## 2018-09-09 ENCOUNTER — Other Ambulatory Visit: Payer: Self-pay | Admitting: Cardiology

## 2018-09-12 ENCOUNTER — Telehealth: Payer: Self-pay

## 2018-09-12 NOTE — Telephone Encounter (Signed)
Changed appointment with dr Mayford Knifeturner to virtual visit and went over medications and patient will have BP ready in the morning, no scale to get weight at home.       Virtual Visit Pre-Appointment Phone Call  "(Name), I am calling you today to discuss your upcoming appointment. We are currently trying to limit exposure to the virus that causes COVID-19 by seeing patients at home rather than in the office."  1. "What is the BEST phone number to call the day of the visit?" - include this in appointment notes  2. "Do you have or have access to (through a family member/friend) a smartphone with video capability that we can use for your visit?" a. If yes - list this number in appt notes as "cell" (if different from BEST phone #) and list the appointment type as a VIDEO visit in appointment notes b. If no - list the appointment type as a PHONE visit in appointment notes  3. Confirm consent - "In the setting of the current Covid19 crisis, you are scheduled for a (phone or video) visit with your provider on (date) at (time).  Just as we do with many in-office visits, in order for you to participate in this visit, we must obtain consent.  If you'd like, I can send this to your mychart (if signed up) or email for you to review.  Otherwise, I can obtain your verbal consent now.  All virtual visits are billed to your insurance company just like a normal visit would be.  By agreeing to a virtual visit, we'd like you to understand that the technology does not allow for your provider to perform an examination, and thus may limit your provider's ability to fully assess your condition. If your provider identifies any concerns that need to be evaluated in person, we will make arrangements to do so.  Finally, though the technology is pretty good, we cannot assure that it will always work on either your or our end, and in the setting of a video visit, we may have to convert it to a phone-only visit.  In either situation,  we cannot ensure that we have a secure connection.  Are you willing to proceed?" STAFF: Did the patient verbally acknowledge consent to telehealth visit? Document YES/NO here: YES  4. Advise patient to be prepared - "Two hours prior to your appointment, go ahead and check your blood pressure, pulse, oxygen saturation, and your weight (if you have the equipment to check those) and write them all down. When your visit starts, your provider will ask you for this information. If you have an Apple Watch or Kardia device, please plan to have heart rate information ready on the day of your appointment. Please have a pen and paper handy nearby the day of the visit as well."  5. Give patient instructions for MyChart download to smartphone OR Doximity/Doxy.me as below if video visit (depending on what platform provider is using)  6. Inform patient they will receive a phone call 15 minutes prior to their appointment time (may be from unknown caller ID) so they should be prepared to answer    TELEPHONE CALL NOTE  Sarah Francis has been deemed a candidate for a follow-up tele-health visit to limit community exposure during the Covid-19 pandemic. I spoke with the patient via phone to ensure availability of phone/video source, confirm preferred email & phone number, and discuss instructions and expectations.  I reminded Sarah Francis to be prepared with any vital sign  and/or heart rhythm information that could potentially be obtained via home monitoring, at the time of her visit. I reminded Sarah Francis to expect a phone call prior to her visit.  Sarah Francis, [place] 09/12/2018 3:20 PM   INSTRUCTIONS FOR DOWNLOADING THE MYCHART APP TO SMARTPHONE  - The patient must first make sure to have activated MyChart and know their login information - If Apple, go to [employer] and type in MyChart in the search bar and download the app. If Android, ask patient to go to [employer] and type in [place] in the search bar and  download the app. The app is free but as with any other app downloads, their phone may require them to verify saved payment information or Apple/Android password.  - The patient will need to then log into the app with their MyChart username and password, and select [facility] as their healthcare provider to link the account. When it is time for your visit, go to the MyChart app, find appointments, and click Begin Video Visit. Be sure to Select Allow for your device to access the Microphone and Camera for your visit. You will then be connected, and your provider will be with you shortly.  **If they have any issues connecting, or need assistance please contact MyChart service desk [phone])**  **If using a computer, in order to ensure the best quality for their visit they will need to use either of the following Internet Browsers: [employer], or Google Chrome**  IF USING DOXIMITY or DOXY.ME - The patient will receive a link just prior to their visit by text.     FULL LENGTH CONSENT FOR TELE-HEALTH VISIT   I hereby voluntarily request, consent and authorize [facility] and its employed or contracted physicians, physician assistants, nurse practitioners or other licensed health care professionals (the Practitioner), to provide me with telemedicine health care services (the "Services") as deemed necessary by the treating Practitioner. I acknowledge and consent to receive the Services by the Practitioner via telemedicine. I understand that the telemedicine visit will involve communicating with the Practitioner through live audiovisual communication technology and the disclosure of certain medical information by electronic transmission. I acknowledge that I have been given the opportunity to request an in-person assessment or other available alternative prior to the telemedicine visit and am voluntarily participating in the telemedicine visit.  I understand that I have the right to  withhold or withdraw my consent to the use of telemedicine in the course of my care at any time, without affecting my right to future care or treatment, and that the Practitioner or I may terminate the telemedicine visit at any time. I understand that I have the right to inspect all information obtained and/or recorded in the course of the telemedicine visit and may receive copies of available information for a reasonable fee.  I understand that some of the potential risks of receiving the Services via telemedicine include:  Marland Kitchen Delay or interruption in medical evaluation due to technological equipment failure or disruption; . Information transmitted may not be sufficient (e.g. poor resolution of images) to allow for appropriate medical decision making by the Practitioner; and/or  . In rare instances, security protocols could fail, causing a breach of personal health information.  Furthermore, I acknowledge that it is my responsibility to provide information about my medical history, conditions and care that is complete and accurate to the best of my ability. I acknowledge that Practitioner's advice, recommendations, and/or decision may be based  on factors not within their control, such as incomplete or inaccurate data provided by me or distortions of diagnostic images or specimens that may result from electronic transmissions. I understand that the practice of medicine is not an exact science and that Practitioner makes no warranties or guarantees regarding treatment outcomes. I acknowledge that I will receive a copy of this consent concurrently upon execution via email to the email address I last provided but may also request a printed copy by calling the office of [facility].    I understand that my insurance will be billed for this visit.   I have read or had this consent read to me. . I understand the contents of this consent, which adequately explains the benefits and risks of the Services being  provided via telemedicine.  . I have been provided ample opportunity to ask questions regarding this consent and the Services and have had my questions answered to my satisfaction. . I give my informed consent for the services to be provided through the use of telemedicine in my medical care  By participating in this telemedicine visit I agree to the above.

## 2018-09-13 ENCOUNTER — Other Ambulatory Visit: Payer: Medicare Other

## 2018-09-13 ENCOUNTER — Telehealth (INDEPENDENT_AMBULATORY_CARE_PROVIDER_SITE_OTHER): Payer: Medicare Other | Admitting: Cardiology

## 2018-09-13 ENCOUNTER — Other Ambulatory Visit: Payer: Self-pay

## 2018-09-13 DIAGNOSIS — F17200 Nicotine dependence, unspecified, uncomplicated: Secondary | ICD-10-CM | POA: Diagnosis not present

## 2018-09-13 DIAGNOSIS — I10 Essential (primary) hypertension: Secondary | ICD-10-CM

## 2018-09-13 DIAGNOSIS — I119 Hypertensive heart disease without heart failure: Secondary | ICD-10-CM

## 2018-09-13 DIAGNOSIS — I2510 Atherosclerotic heart disease of native coronary artery without angina pectoris: Secondary | ICD-10-CM

## 2018-09-13 DIAGNOSIS — E782 Mixed hyperlipidemia: Secondary | ICD-10-CM

## 2018-09-13 MED ORDER — METOPROLOL TARTRATE 50 MG PO TABS: 50.0000 mg | ORAL_TABLET | Freq: Two times a day (BID) | ORAL | 3 refills | Status: DC

## 2018-09-13 NOTE — Patient Instructions (Signed)
Medication Instructions:  Please increase Lopressor to 50 mg - 1 tablet by mouth twice a day. Continue all other medications as listed.d If you need a refill on your cardiac medications before your next appointment, please call your pharmacy.   Follow-Up: At [facility], you and your health needs are our priority.  As part of our continuing mission to provide you with exceptional heart care, we have created designated Provider Care Teams.  These Care Teams include your primary Cardiologist (physician) and Advanced Practice Providers (APPs -  Physician Assistants and Nurse Practitioners) who all work together to provide you with the care you need, when you need it. You will need a follow up appointment in 6 months.  Please call our office 2 months in advance to schedule this appointment.  You may see Fransico Him, MD or one of the following Advanced Practice Providers on your designated Care Team:   [place], PA-C Melina Copa, PA-C . Ermalinda Barrios, PA-C  Thank you for choosing [facility]!!

## 2018-09-13 NOTE — Progress Notes (Signed)
Virtual Visit via Video Note   This visit type was conducted due to national recommendations for restrictions regarding the COVID-19 Pandemic (e.g. social distancing) in an effort to limit this patient's exposure and mitigate transmission in our community.  Due to her co-morbid illnesses, this patient is at least at moderate risk for complications without adequate follow up.  This format is felt to be most appropriate for this patient at this time.  All issues noted in this document were discussed and addressed.  A limited physical exam was performed with this format.  Please refer to the patient's chart for her consent to telehealth for [facility].   Evaluation Performed:  Follow-up visit  This visit type was conducted due to national recommendations for restrictions regarding the COVID-19 Pandemic (e.g. social distancing).  This format is felt to be most appropriate for this patient at this time.  All issues noted in this document were discussed and addressed.  No physical exam was performed (except for noted visual exam findings with Video Visits).  Please refer to the patient's chart (MyChart message for video visits and phone note for telephone visits) for the patient's consent to telehealth for [facility].  Date:  09/13/2018   ID:  Sarah Francis, DOB 24-Jul-1945, MRN [id number]  Patient Location:  Home  Provider location:   [place]  PCP:  Gweneth DimitriMcNeill, Wendy, MD  Cardiologist:  Armanda Magicraci Joal Eakle, MD  Electrophysiologist:  None   Chief Complaint: coronary artery calcifications, HTN, HLD  History of Present Illness:    Sarah Francis is a 73 y.o. female who presents via audio/video conferencing for a telehealth visit today.    Sarah Francis is a 73 y.o. female with history of HTN, PAD, tobacco abuse, strong family of CAD.  Patient had coronary CT in 2018 with elevated calcium score over thousand 98 percentile and three-vessel coronary calcification.  Nuclear stress test showed no  ischemia.  She was treated medically at that time.    Patient presented to the hospital 06/20/2018 with NSTEMI treated with overlapping stent in the proximal and mid RCA with residual mild to moderate nonobstructive disease 40% left main, 60% distal LAD 50% proximal circumflex to be treated medically with Aspirin and Brilinta for 1 year and atorvastatin increased to 80 mg daily.  2D echo showed normal LVEF 65% normal wall motion abnormality.  She was seen back in the office 06/2018 complaining of inability to take a deep breath in but had not been using caffeine prior to taking her Brilinta so this was encouraged.   She is here today for followup and is doing well.  She denies any chest pain or pressure, SOB, DOE, PND, orthopnea, LE edema, dizziness, palpitations or syncope. She is compliant with her meds and is tolerating meds with no SE.      The patient does not have symptoms concerning for COVID-19 infection (fever, chills, cough, or new shortness of breath).    Prior CV studies:   The following studies were reviewed today:  none  Past Medical History:  Diagnosis Date  . Anxiety   . Aortic atherosclerosis (HCC)   . Coronary artery calcification seen on CAT scan 05/17/2016  . Diabetes mellitus without complication (HCC)   . GERD (gastroesophageal reflux disease)   . Hypertension   . Major depression    initial diagnosis/medication around age 73  . NSTEMI (non-ST elevated myocardial infarction) (HCC)    06/20/18 PCI/DES overlapping stents to p/m RCA, mild disease in LAD, and Lcx  .  Osteoporosis    w h/o sacral fracture   Past Surgical History:  Procedure Laterality Date  . ABDOMINAL HYSTERECTOMY    . CORONARY STENT INTERVENTION N/A 06/20/2018   Procedure: CORONARY STENT INTERVENTION;  Surgeon: Tonny Bollmanooper, Michael, MD;  [facility];  Service: Cardiovascular;  Laterality: N/A;  . KNEE SURGERY Right 2012  . LEFT HEART CATH AND CORONARY ANGIOGRAPHY N/A 06/20/2018   Procedure:  LEFT HEART CATH AND CORONARY ANGIOGRAPHY;  Surgeon: Tonny Bollmanooper, Michael, MD;  [facility];  Service: Cardiovascular;  Laterality: N/A;     Current Meds  Medication Sig  . aspirin EC 81 MG tablet Take 81 mg by mouth daily.  Marland Kitchen. atorvastatin (LIPITOR) 80 MG tablet Take 1 tablet (80 mg total) by mouth daily at 6 PM.  . cetirizine (ZYRTEC) 10 MG tablet Take 10 mg by mouth every morning.  . cholecalciferol (VITAMIN D3) 25 MCG (1000 UT) tablet Take 2,000 Units by mouth daily.  . cycloSPORINE (RESTASIS) 0.05 % ophthalmic emulsion Place 1 drop into both eyes 2 (two) times daily.  . Insulin Glargine (LANTUS SOLOSTAR) 100 UNIT/ML Solostar Pen Inject 15-30 Units into the skin 2 (two) times daily. 15 units in the morning and 30 units in the evening  . Insulin Lispro (HUMALOG KWIKPEN [place]) Inject 8-12 Units into the skin 3 (three) times daily. 8 units with breakfast, 10 units with lunch and 12 units with dinner  . lisinopril (ZESTRIL) 20 MG tablet Take 1 tablet (20 mg total) by mouth daily.  . metoprolol tartrate (LOPRESSOR) 25 MG tablet TAKE 1 TABLET BY MOUTH TWICE A DAY  . nicotine (NICODERM CQ) 21 mg/24hr patch Place 1 patch (21 mg total) onto the skin daily.  . nitroGLYCERIN (NITROSTAT) 0.4 MG SL tablet Place 1 tablet (0.4 mg total) under the tongue every 5 (five) minutes x 3 doses as needed for chest pain.  Marland Kitchen. omeprazole (PRILOSEC) 20 MG capsule Take 20 mg by mouth daily.  . ticagrelor (BRILINTA) 90 MG TABS tablet Take 1 tablet (90 mg total) by mouth 2 (two) times daily.     Allergies:   Penicillins   Social History   Tobacco Use  . Smoking status: Current Every Day Smoker    Packs/day: 1.00    Years: 88.00    Pack years: 88.00  . Smokeless tobacco: Never Used  . Tobacco comment: Currently smoker 1ppd  Substance Use Topics  . Alcohol use: Yes    Comment: 1-2 glasses of Wine per day  . Drug use: No     Family Hx: The patient's family history includes AAA (abdominal aortic  aneurysm) in her father; Cancer in her brother; Diabetes in her brother; Heart attack in her mother; Heart disease in her mother; Hypertension in her sister.  ROS:   Please see the history of present illness.     All other systems reviewed and are negative.   Labs/Other Tests and Data Reviewed:    Recent Labs: 06/16/2018: TSH 1.288 06/21/2018: Hemoglobin 12.3; Platelets 205 07/20/2018: BUN 9; Creatinine, Ser 0.65; Potassium 4.2; Sodium 136   Recent Lipid Panel Lab Results  Component Value Date/Time   CHOL 137 06/17/2018 03:57 AM   TRIG 211 (H) 06/17/2018 03:57 AM   HDL 35 (L) 06/17/2018 03:57 AM   CHOLHDL 3.9 06/17/2018 03:57 AM   LDLCALC 60 06/17/2018 03:57 AM    Wt Readings from Last 3 Encounters:  06/28/18 160 lb (72.6 kg)  06/21/18 159 lb 13.3 oz (72.5 kg)  06/21/17 150 lb (68 kg)     Objective:    Vital Signs:  BP (!) 146/76   Pulse 87   Ht 5\' 1"  (1.549 m)   SpO2 97%   BMI 30.23 kg/m     ASSESSMENT & PLAN:    1.  ASCAD - s/p NSTEMI treated with overlapping stent in the proximal and mid RCA with residual mild to moderate nonobstructive disease 40% left main, 60% distal LAD 50% proximal circumflex to be treated medically.  She has not had any anginal sx.  She will continue on ASA 81mg  daily, Brilinta 90mg  BID, high dose statin and Lopressor.   2.  Hyperlipidemia - her LDL goal is < 70.  She will continue on atorvastatin 80mg  daily.  Her LDL was at goal at 60 in 05/2018.   3.  Hypertension - her BP is controlled on exam today.  She will continue on Lisinopril 20mg  daily and increase Lopressor to 50mg  BID.   Creatinine was stable at 0.65 in June. I have asked her to check her BP daily for a week and call with results.   4.  Tobacco abuse - we discussed ongoing tobacco use and need to quit.  She is currently using nicotine patches. She has cut back to 1/2ppd and is very motivated to quit.  COVID-19 Education: The signs and symptoms of COVID-19 were discussed with the  patient and how to seek care for testing (follow up with PCP or arrange E-visit).  The importance of social distancing was discussed today.  Patient Risk:   After full review of this patient's clinical status, I feel that they are at least moderate risk at this time.  Time:   Today, I have spent 20 minutes directly with the patient on telemedicine discussing medical problems including CAD, HTN, HLD, tobacco.  We also reviewed the symptoms of COVID 19 and the ways to protect against contracting the virus with telehealth technology.  I spent an additional 5 minutes reviewing patient's chart including labs.  Medication Adjustments/Labs and Tests Ordered: Current medicines are reviewed at length with the patient today.  Concerns regarding medicines are outlined above.  Tests Ordered: No orders of the defined types were placed in this encounter.  Medication Changes: No orders of the defined types were placed in this encounter.   Disposition:  Follow up in 6 month(s) virtual  Signed, Fransico Him, MD  09/13/2018 11:19 AM    [place]

## 2018-10-23 ENCOUNTER — Telehealth: Payer: Self-pay | Admitting: Cardiology

## 2018-10-23 NOTE — Telephone Encounter (Signed)
°  Patient had an incident last Thursday night that required her to take Nitroglycerin. Her blood sugar spiked to over 500, so she had some stomach cramps and other discomfort that woke her up. She took some insulin to help her blood sugar, but she then started to feel a pain behind her heart. The pain went down your arm and so she took a total of 3 nitroglycerin tablets 15 minutes apart. The pain subsided after the 3rd dose. She has kept her blood sugar in check since then, and she has not had any other chest pain since. She just wants to make sure she is doing the right thing. The pain she experienced was very similar to the heart attack she had in May.  She feels fine now, she would just like more advice for future instances ?

## 2018-10-23 NOTE — Telephone Encounter (Signed)
I spoke to the patient who called to inform us that her blood sugar has been elevated and she recently had CP, last Thursday night. The CP was relieved with Nitro x 3.  She has felt fine since, but will continue to monitor and call if need be.  She will have her PCP review her blood sugar.

## 2018-11-14 ENCOUNTER — Other Ambulatory Visit: Payer: Self-pay | Admitting: Cardiology

## 2018-11-21 ENCOUNTER — Other Ambulatory Visit: Payer: Self-pay

## 2018-11-21 ENCOUNTER — Encounter (HOSPITAL_COMMUNITY): Payer: Self-pay | Admitting: Emergency Medicine

## 2018-11-21 ENCOUNTER — Emergency Department (HOSPITAL_COMMUNITY): Payer: Medicare Other

## 2018-11-21 ENCOUNTER — Emergency Department (HOSPITAL_COMMUNITY)
Admission: EM | Admit: 2018-11-21 | Discharge: 2018-11-21 | Disposition: A | Discharge status: Home / Self Care | Payer: Medicare Other | Attending: Emergency Medicine | Admitting: Emergency Medicine

## 2018-11-21 ENCOUNTER — Other Ambulatory Visit: Payer: Self-pay | Admitting: Cardiology

## 2018-11-21 DIAGNOSIS — R778 Other specified abnormalities of plasma proteins: Secondary | ICD-10-CM | POA: Diagnosis not present

## 2018-11-21 DIAGNOSIS — I252 Old myocardial infarction: Secondary | ICD-10-CM | POA: Insufficient documentation

## 2018-11-21 DIAGNOSIS — I2510 Atherosclerotic heart disease of native coronary artery without angina pectoris: Secondary | ICD-10-CM | POA: Diagnosis not present

## 2018-11-21 DIAGNOSIS — S8991XA Unspecified injury of right lower leg, initial encounter: Secondary | ICD-10-CM | POA: Diagnosis present

## 2018-11-21 DIAGNOSIS — Y939 Activity, unspecified: Secondary | ICD-10-CM | POA: Diagnosis not present

## 2018-11-21 DIAGNOSIS — Z20828 Contact with and (suspected) exposure to other viral communicable diseases: Secondary | ICD-10-CM | POA: Insufficient documentation

## 2018-11-21 DIAGNOSIS — R55 Syncope and collapse: Secondary | ICD-10-CM | POA: Insufficient documentation

## 2018-11-21 DIAGNOSIS — I10 Essential (primary) hypertension: Secondary | ICD-10-CM | POA: Insufficient documentation

## 2018-11-21 DIAGNOSIS — Y999 Unspecified external cause status: Secondary | ICD-10-CM | POA: Insufficient documentation

## 2018-11-21 DIAGNOSIS — R011 Cardiac murmur, unspecified: Secondary | ICD-10-CM | POA: Diagnosis not present

## 2018-11-21 DIAGNOSIS — S81811A Laceration without foreign body, right lower leg, initial encounter: Secondary | ICD-10-CM | POA: Insufficient documentation

## 2018-11-21 DIAGNOSIS — Y929 Unspecified place or not applicable: Secondary | ICD-10-CM | POA: Diagnosis not present

## 2018-11-21 DIAGNOSIS — Z7982 Long term (current) use of aspirin: Secondary | ICD-10-CM | POA: Insufficient documentation

## 2018-11-21 DIAGNOSIS — X58XXXA Exposure to other specified factors, initial encounter: Secondary | ICD-10-CM | POA: Diagnosis not present

## 2018-11-21 DIAGNOSIS — E119 Type 2 diabetes mellitus without complications: Secondary | ICD-10-CM | POA: Diagnosis not present

## 2018-11-21 DIAGNOSIS — F17200 Nicotine dependence, unspecified, uncomplicated: Secondary | ICD-10-CM | POA: Diagnosis not present

## 2018-11-21 DIAGNOSIS — Z79899 Other long term (current) drug therapy: Secondary | ICD-10-CM | POA: Diagnosis not present

## 2018-11-21 DIAGNOSIS — Z794 Long term (current) use of insulin: Secondary | ICD-10-CM | POA: Insufficient documentation

## 2018-11-21 DIAGNOSIS — Z9861 Coronary angioplasty status: Secondary | ICD-10-CM

## 2018-11-21 LAB — COMPREHENSIVE METABOLIC PANEL
ALT: 24 U/L (ref 0–44)
AST: 19 U/L (ref 15–41)
Albumin: 3.9 g/dL (ref 3.5–5.0)
Alkaline Phosphatase: 68 U/L (ref 38–126)
Anion gap: 13 (ref 5–15)
BUN: 10 mg/dL (ref 8–23)
CO2: 21 mmol/L — ABNORMAL LOW (ref 22–32)
Calcium: 9 mg/dL (ref 8.9–10.3)
Chloride: 98 mmol/L (ref 98–111)
Creatinine, Ser: 0.89 mg/dL (ref 0.44–1.00)
GFR calc Af Amer: >60 mL/min (ref >60)
GFR calc non Af Amer: >60 mL/min (ref >60)
Glucose, Bld: 217 mg/dL — ABNORMAL HIGH (ref 70–99)
Potassium: 4.8 mmol/L (ref 3.5–5.1)
Sodium: 132 mmol/L — ABNORMAL LOW (ref 135–145)
Total Bilirubin: 0.5 mg/dL (ref 0.3–1.2)
Total Protein: 7.1 g/dL (ref 6.5–8.1)

## 2018-11-21 LAB — URINALYSIS, ROUTINE W REFLEX MICROSCOPIC
Bilirubin Urine: NEGATIVE
Glucose, UA: NEGATIVE mg/dL
Ketones, ur: NEGATIVE mg/dL
Nitrite: NEGATIVE
Protein, ur: NEGATIVE mg/dL
Specific Gravity, Urine: 1.015 (ref 1.005–1.030)
pH: 6 (ref 5.0–8.0)

## 2018-11-21 LAB — CBC WITH DIFFERENTIAL/PLATELET
Abs Immature Granulocytes: 0.05 10*3/uL (ref 0.00–0.07)
Basophils Absolute: 0 10*3/uL (ref 0.0–0.1)
Basophils Relative: 0 %
Eosinophils Absolute: 0.1 10*3/uL (ref 0.0–0.5)
Eosinophils Relative: 2 %
HCT: 42.8 % (ref 36.0–46.0)
Hemoglobin: 14.4 g/dL (ref 12.0–15.0)
Immature Granulocytes: 1 %
Lymphocytes Relative: 17 %
Lymphs Abs: 1.6 10*3/uL (ref 0.7–4.0)
MCH: 32.1 pg (ref 26.0–34.0)
MCHC: 33.6 g/dL (ref 30.0–36.0)
MCV: 95.3 fL (ref 80.0–100.0)
Monocytes Absolute: 0.7 10*3/uL (ref 0.1–1.0)
Monocytes Relative: 7 %
Neutro Abs: 7.1 10*3/uL (ref 1.7–7.7)
Neutrophils Relative %: 73 %
Platelets: 300 10*3/uL (ref 150–400)
RBC: 4.49 MIL/uL (ref 3.87–5.11)
RDW: 13.5 % (ref 11.5–15.5)
WBC: 9.6 10*3/uL (ref 4.0–10.5)
nRBC: 0 % (ref 0.0–0.2)

## 2018-11-21 LAB — SARS CORONAVIRUS 2 (TAT 6-24 HRS): SARS Coronavirus 2: NEGATIVE

## 2018-11-21 LAB — URINALYSIS, MICROSCOPIC (REFLEX)

## 2018-11-21 LAB — TROPONIN I (HIGH SENSITIVITY)
Troponin I (High Sensitivity): 36 ng/L — ABNORMAL HIGH (ref <18)
Troponin I (High Sensitivity): 43 ng/L — ABNORMAL HIGH (ref <18)
Troponin I (High Sensitivity): 33 ng/L — ABNORMAL HIGH (ref <18)

## 2018-11-21 MED ORDER — SODIUM CHLORIDE 0.9 % IV SOLN: INTRAVENOUS | Status: DC

## 2018-11-21 MED ORDER — SODIUM CHLORIDE 0.9 % IV BOLUS
1000.0000 mL | Freq: Once | INTRAVENOUS | Status: AC
  Administered 2018-11-21: 1000 mL via INTRAVENOUS

## 2018-11-21 NOTE — ED Notes (Signed)
Patient Alert and oriented to baseline. Stable and ambulatory to baseline. Patient verbalized understanding of the discharge instructions.  Patient belongings were taken by the patient.   

## 2018-11-21 NOTE — ED Provider Notes (Addendum)
[facility] EMERGENCY DEPARTMENT Provider Note   CSN: [id number] Arrival date & time: 11/21/18  0045     History   Chief Complaint Chief Complaint  Patient presents with   Fall    HPI Sarah Francis is a 73 y.o. female.     Pt presents to the ED today with a syncopal episode.  Pt said she does not know what happened.  She had just eaten dinner with her daughter.  The daughter left the room and heard a noise.  She came back and found the patient on the ground.  She was initially very confused, but that lasted just a short time.  Pt came by EMS, but waited in the [facility] for over 7 hrs.  She denies any pain.  She does have a hx of CAD and had 4 stents placed in May.  She did sustain a skin tear to her right ankle, but no other injuries.  She is not sure if she hit her head, but she is on Brilinta.     Past Medical History:  Diagnosis Date   Anxiety    Aortic atherosclerosis ([place])    Coronary artery calcification seen on CAT scan 05/17/2016   Diabetes mellitus without complication (HCC)    GERD (gastroesophageal reflux disease)    Hypertension    Major depression    initial diagnosis/medication around age 62   NSTEMI (non-ST elevated myocardial infarction) ([place])    06/20/18 PCI/DES overlapping stents to p/m RCA, mild disease in LAD, and Lcx   Osteoporosis    w h/o sacral fracture    Patient Active Problem List   Diagnosis Date Noted   CAD (coronary artery disease) 06/28/2018   Family history of early CAD 06/28/2018   NSTEMI (non-ST elevated myocardial infarction) ([place]) 06/16/2018   Peripheral arterial disease ([place]) 06/25/2016   Aortic atherosclerosis ([place])    Coronary artery calcification seen on CAT scan 05/17/2016   Claudication ([place]) 05/17/2016   Heart murmur 05/17/2016   Chest pain 12/25/2012   Essential hypertension    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[place]) 08/65/7846   Polyneuropathy in diabetes(357.2) 12/22/2012    Plantar fasciitis, bilateral 12/22/2012   Mixed hyperlipidemia 12/22/2012   Osteoporosis, unspecified 12/22/2012   Vitamin D deficiency 12/22/2012   Restless legs syndrome (RLS) 12/22/2012   Anxiety 12/22/2012   Sleep disturbance 12/22/2012   Tobacco use disorder 12/22/2012   Alcohol dependence ([place]) 12/22/2012    Past Surgical History:  Procedure Laterality Date   ABDOMINAL HYSTERECTOMY     CORONARY STENT INTERVENTION N/A 06/20/2018   Procedure: CORONARY STENT INTERVENTION;  Surgeon: Sherren Mocha, MD;  Location: [place] [facility];  Service: Cardiovascular;  Laterality: N/A;   KNEE SURGERY Right 2012   LEFT HEART CATH AND CORONARY ANGIOGRAPHY N/A 06/20/2018   Procedure: LEFT HEART CATH AND CORONARY ANGIOGRAPHY;  Surgeon: Sherren Mocha, MD;  Location: [place] [facility];  Service: Cardiovascular;  Laterality: N/A;     OB History   No obstetric history on file.      Home Medications    Prior to Admission medications   Medication Sig Start Date End Date Taking? Authorizing Provider  aspirin EC 81 MG tablet Take 81 mg by mouth daily.   Yes [provider]  atorvastatin (LIPITOR) 80 MG tablet TAKE 1 TABLET BY MOUTH EVERY DAY AT 6PM Patient taking differently: Take 80 mg by mouth daily.  11/15/18  Yes Sueanne Margarita, MD  cetirizine (ZYRTEC) 10 MG tablet Take  10 mg by mouth every morning.   Yes [provider]  cholecalciferol (VITAMIN D3) 25 MCG (1000 UT) tablet Take 2,000 Units by mouth daily.   Yes [provider]  cycloSPORINE (RESTASIS) 0.05 % ophthalmic emulsion Place 1 drop into both eyes 2 (two) times daily.   Yes [provider]  DULoxetine (CYMBALTA) 60 MG capsule Take 60 mg by mouth daily. 10/30/18  Yes [provider]  Insulin Glargine (LANTUS SOLOSTAR) 100 UNIT/ML Solostar Pen Inject 15-35 Units into the skin 2 (two) times daily. 15 units in the morning and 35 units in the evening   Yes [provider]    Insulin Lispro (HUMALOG KWIKPEN [place]) Inject 8-12 Units into the skin 3 (three) times daily. 8 units with breakfast, 10 units with lunch and 12 units with dinner   Yes [provider]  lisinopril (ZESTRIL) 20 MG tablet Take 1 tablet (20 mg total) by mouth daily. 07/12/18 07/12/19 Yes Turner, Cornelious Bryantraci R, MD  metoprolol tartrate (LOPRESSOR) 50 MG tablet Take 1 tablet (50 mg total) by mouth 2 (two) times daily. 09/13/18  Yes Turner, Cornelious Bryantraci R, MD  nicotine (NICODERM CQ) 21 mg/24hr patch Place 1 patch (21 mg total) onto the skin daily. 06/28/18  Yes Dyann KiefLenze, Michele M, PA-C  nitroGLYCERIN (NITROSTAT) 0.4 MG SL tablet Place 1 tablet (0.4 mg total) under the tongue every 5 (five) minutes x 3 doses as needed for chest pain. 06/21/18  Yes Arty Baumgartneroberts, Lindsay B, NP  omeprazole (PRILOSEC) 20 MG capsule Take 20 mg by mouth daily.   Yes [provider]  thiamine (VITAMIN B-1) 100 MG tablet Take 100 mg by mouth daily.   Yes [provider]  ticagrelor (BRILINTA) 90 MG TABS tablet Take 1 tablet (90 mg total) by mouth 2 (two) times daily. 06/21/18  Yes Arty Baumgartneroberts, Lindsay B, NP    Family History Family History  Problem Relation Age of Onset   Heart attack Mother    Heart disease Mother    AAA (abdominal aortic aneurysm) Father    Cancer Brother    Hypertension Sister    Diabetes Brother     Social History Social History   Tobacco Use   Smoking status: Current Every Day Smoker    Packs/day: 1.00    Years: 88.00    Pack years: 88.00   Smokeless tobacco: Never Used   Tobacco comment: Currently smoker 1ppd  Substance Use Topics   Alcohol use: Yes    Comment: 1-2 glasses of Wine per day   Drug use: No     Allergies   Penicillins   Review of Systems Review of Systems  Skin: Positive for wound.  Neurological: Positive for syncope.  All other systems reviewed and are negative.    Physical Exam Updated Vital Signs BP (!) 126/53 (BP Location: Left Arm)    Pulse 97    Temp  97.8 F (36.6 C) (Oral)    Resp 16    SpO2 96%   Physical Exam Vitals signs and nursing note reviewed.  Constitutional:      Appearance: Normal appearance.  HENT:     Head: Normocephalic and atraumatic.     Right Ear: External ear normal.     Left Ear: External ear normal.     Nose: Nose normal.     Mouth/Throat:     Mouth: Mucous membranes are moist.     Pharynx: Oropharynx is clear.  Eyes:     Extraocular Movements: Extraocular movements intact.  Conjunctiva/sclera: Conjunctivae normal.     Pupils: Pupils are equal, round, and reactive to light.  Neck:     Musculoskeletal: Normal range of motion and neck supple.  Cardiovascular:     Rate and Rhythm: Normal rate and regular rhythm.     Pulses: Normal pulses.     Heart sounds: Normal heart sounds.  Pulmonary:     Effort: Pulmonary effort is normal.     Breath sounds: Normal breath sounds.  Abdominal:     General: Abdomen is flat. Bowel sounds are normal.     Palpations: Abdomen is soft.  Musculoskeletal: Normal range of motion.  Skin:    General: Skin is warm.     Capillary Refill: Capillary refill takes less than 2 seconds.     Comments: Skin tear lateral right ankle.  No bony pain.  Neurological:     General: No focal deficit present.     Mental Status: She is alert and oriented to person, place, and time.  Psychiatric:        Mood and Affect: Mood normal.        Behavior: Behavior normal.        Thought Content: Thought content normal.        Judgment: Judgment normal.      ED Treatments / Results  Labs (all labs ordered are listed, but only abnormal results are displayed) Labs Reviewed  COMPREHENSIVE METABOLIC PANEL - Abnormal; Notable for the following components:      Result Value   Sodium 132 (*)    CO2 21 (*)    Glucose, Bld 217 (*)    All other components within normal limits  URINALYSIS, ROUTINE W REFLEX MICROSCOPIC - Abnormal; Notable for the following components:   APPearance HAZY (*)    Hgb  urine dipstick TRACE (*)    Leukocytes,Ua TRACE (*)    All other components within normal limits  URINALYSIS, MICROSCOPIC (REFLEX) - Abnormal; Notable for the following components:   Bacteria, UA FEW (*)    All other components within normal limits  TROPONIN I (HIGH SENSITIVITY) - Abnormal; Notable for the following components:   Troponin I (High Sensitivity) 36 (*)    All other components within normal limits  TROPONIN I (HIGH SENSITIVITY) - Abnormal; Notable for the following components:   Troponin I (High Sensitivity) 43 (*)    All other components within normal limits  TROPONIN I (HIGH SENSITIVITY) - Abnormal; Notable for the following components:   Troponin I (High Sensitivity) 33 (*)    All other components within normal limits  SARS CORONAVIRUS 2 (TAT 6-24 HRS)  CBC WITH DIFFERENTIAL/PLATELET    EKG EKG Interpretation  Date/Time:  Tuesday November 21 2018 01:36:08 EDT Ventricular Rate:  84 PR Interval:  142 QRS Duration: 66 QT Interval:  374 QTC Calculation: 441 R Axis:   28 Text Interpretation: Normal sinus rhythm ST & T wave abnormality, consider lateral ischemia Abnormal ECG No significant change since last tracing Confirmed by Jacalyn Lefevre [phone]) on 11/21/2018 8:40:19 AM   Radiology Dg Chest 2 View  Result Date: 11/21/2018 CLINICAL DATA:  Syncope EXAM: CHEST - 2 VIEW COMPARISON:  06/16/2018 FINDINGS: Cardiomegaly, aortic atherosclerosis. Heterogeneous opacity of the lingula, similar in appearance to prior examination dated 06/16/2018. Disc degenerative disease of the thoracic spine. IMPRESSION: 1. Heterogeneous opacity of the lingula, similar in appearance to prior examination dated 06/16/2018, which may reflect airspace disease or atelectasis. 2.  Cardiomegaly. Electronically Signed   By: Lauralyn Primes  M.D.   On: 11/21/2018 09:34   Ct Head Wo Contrast  Result Date: 11/21/2018 CLINICAL DATA:  Altered level of consciousness, fall with question of head trauma and  confusion. EXAM: CT HEAD WITHOUT CONTRAST TECHNIQUE: Contiguous axial images were obtained from the base of the skull through the vertex without intravenous contrast. COMPARISON:  01/10/2016 FINDINGS: Brain: No evidence of acute infarction, hemorrhage, hydrocephalus, extra-axial collection or mass lesion/mass effect. Signs of generalized atrophy and chronic microvascular ischemic change not changed since prior exam. Vascular: No hyperdense vessel or unexpected calcification. Skull: Stable opacification of left mastoid air cells. No signs of acute fracture or destructive lesion. Sinuses/Orbits: No acute finding. Other: None. IMPRESSION: No acute intracranial finding. Electronically Signed   By: Donzetta Kohut M.D.   On: 11/21/2018 10:00    Procedures Procedures (including critical care time)  Medications Ordered in ED Medications  sodium chloride 0.9 % bolus 1,000 mL (0 mLs Intravenous Stopped 11/21/18 1121)    And  0.9 %  sodium chloride infusion (has no administration in time range)     Initial Impression / Assessment and Plan / ED Course  I have reviewed the triage vital signs and the nursing notes.  Pertinent labs & imaging results that were available during my care of the patient were reviewed by me and considered in my medical decision making (see chart for details).   labs and CTs/Xrays reviewed.  Pt's troponins are mildly elevated.  With the syncopal episode and elevated troponins, I spoke with cardiology who did see pt.  They can do work up as an outpatient.  Pt is ok with that plan.  Final Clinical Impressions(s) / ED Diagnoses   Final diagnoses:  Syncope, unspecified syncope type  Elevated troponin  Skin tear of right lower leg without complication, initial encounter    ED Discharge Orders    None       Jacalyn Lefevre, MD 11/21/18 1503    Jacalyn Lefevre, MD 11/21/18 [phone]

## 2018-11-21 NOTE — ED Triage Notes (Addendum)
Patient arrived with EMS from home reports brief syncopal episode/fall this evening at home , patient can not recall incident , denies pain , alert and oriented at arrival , skin tear at right ankle dressed by EMS prior to arrival . History of CAD/NSTEMI.

## 2018-11-21 NOTE — Consult Note (Addendum)
Cardiology ER Consult:   Patient ID: Sarah Francis; MRN: [id number]; DOB: Oct 12, 1945   Admission date: 11/21/2018  Primary Care Provider: Cari Caraway, MD Primary Cardiologist: Dr. Fransico Him Primary Electrophysiologist: None  Chief Complaint: Syncope/AMS  Patient Profile:   Sarah Francis is a 73 y.o. female  with a history of hypertension, type 2 diabetes mellitus, coronary artery disease status post PCI with overlapping stent to proximal and mid RCA with residual mild to moderate nonobstructive disease of the left main (40%), 60% residual disease of the LAD and 50% proximal circumflex (May 2020), tobacco use disorder, peripheral arterial disease, GERD, major depression who presented to the ED following a syncopal episode.  History of Present Illness:   Sarah Francis is a 73 y.o. female with a history of hypertension, type 2 diabetes mellitus, coronary artery disease status post PCI with overlapping stent to proximal and mid RCA with residual mild to moderate nonobstructive disease of the left main (40%), 60% residual disease of the LAD and 50% proximal circumflex (May 2020), tobacco use disorder, peripheral arterial disease, GERD, major depression who presented to [facility] ED following a syncopal episode.  Patient does remember exactly what happened yesterday and gave me permission to speak to her daughter Sarah Francis, telephone [phone]).  Her daughter tells me that she had arrived to her mother's house to have dinner and while she was using the bathroom she had her to thud.  She came out of the bathroom and found her mother lying by the fridge and had signs and symptoms of altered mental status.  She did not experience focal neurologic weakness, facial droop, urinary or bowel incontinence.  Patient also denies any preceding chest pain, shortness of breath, dizziness, lightheadedness, diaphoresis, dysrhythmia, headache, nausea, vomiting but she does report of "getting up too fast" while  they were having dinner at home. She also reports that over the past month she has had to episodes of angina which was relieved with nitroglycerin.  The last episode was about a month ago.  Presently, she is asymptomatic and denies any symptoms whatsoever.  ED course: BP 120s-50s/60s-70s, pulse 80s-90s, SPO2 95% on room air, EKG without acute ST changes, high-sensitivity troponin 36>> 43>> 33.   Past Medical History:  Diagnosis Date   Anxiety    Aortic atherosclerosis (HCC)    Coronary artery calcification seen on CAT scan 05/17/2016   Diabetes mellitus without complication (HCC)    GERD (gastroesophageal reflux disease)    Hypertension    Major depression    initial diagnosis/medication around age 14   NSTEMI (non-ST elevated myocardial infarction) ([place])    06/20/18 PCI/DES overlapping stents to p/m RCA, mild disease in LAD, and Lcx   Osteoporosis    w h/o sacral fracture    Past Surgical History:  Procedure Laterality Date   ABDOMINAL HYSTERECTOMY     CORONARY STENT INTERVENTION N/A 06/20/2018   Procedure: CORONARY STENT INTERVENTION;  Surgeon: Sherren Mocha, MD;  Location: [facility];  Service: Cardiovascular;  Laterality: N/A;   KNEE SURGERY Right 2012   LEFT HEART CATH AND CORONARY ANGIOGRAPHY N/A 06/20/2018   Procedure: LEFT HEART CATH AND CORONARY ANGIOGRAPHY;  Surgeon: Sherren Mocha, MD;  Location: [facility];  Service: Cardiovascular;  Laterality: N/A;     Medications Prior to Admission: Prior to Admission medications   Medication Sig Start Date End Date Taking? Authorizing Provider  aspirin EC 81 MG tablet Take 81 mg by mouth daily.    [provider]  atorvastatin (LIPITOR) 80 MG tablet TAKE 1 TABLET BY MOUTH EVERY DAY AT 6PM 11/15/18   Quintella Reicherturner, Traci R, MD  cetirizine (ZYRTEC) 10 MG tablet Take 10 mg by mouth every morning.    [provider]  cholecalciferol (VITAMIN D3) 25 MCG (1000 UT) tablet Take 2,000 Units by mouth  daily.    [provider]  cycloSPORINE (RESTASIS) 0.05 % ophthalmic emulsion Place 1 drop into both eyes 2 (two) times daily.    [provider]  Insulin Glargine (LANTUS SOLOSTAR) 100 UNIT/ML Solostar Pen Inject 15-30 Units into the skin 2 (two) times daily. 15 units in the morning and 30 units in the evening    [provider]  Insulin Lispro (HUMALOG KWIKPEN [place]) Inject 8-12 Units into the skin 3 (three) times daily. 8 units with breakfast, 10 units with lunch and 12 units with dinner    [provider]  lisinopril (ZESTRIL) 20 MG tablet Take 1 tablet (20 mg total) by mouth daily. 07/12/18 07/12/19  Quintella Reicherturner, Traci R, MD  metoprolol tartrate (LOPRESSOR) 50 MG tablet Take 1 tablet (50 mg total) by mouth 2 (two) times daily. 09/13/18   Quintella Reicherturner, Traci R, MD  nicotine (NICODERM CQ) 21 mg/24hr patch Place 1 patch (21 mg total) onto the skin daily. 06/28/18   Dyann KiefLenze, Michele M, PA-C  nitroGLYCERIN (NITROSTAT) 0.4 MG SL tablet Place 1 tablet (0.4 mg total) under the tongue every 5 (five) minutes x 3 doses as needed for chest pain. 06/21/18   Arty Baumgartneroberts, Lindsay B, NP  omeprazole (PRILOSEC) 20 MG capsule Take 20 mg by mouth daily.    [provider]  ticagrelor (BRILINTA) 90 MG TABS tablet Take 1 tablet (90 mg total) by mouth 2 (two) times daily. 06/21/18   Arty Baumgartneroberts, Lindsay B, NP     Allergies:    Allergies  Allergen Reactions   Penicillins Rash    Social History:   Social History   Socioeconomic History   Marital status: Single    Spouse name: Not on file   Number of children: Not on file   Years of education: Not on file   Highest education level: Not on file  Occupational History   Not on file  Social Needs   Financial resource strain: Not on file   Food insecurity    Worry: Not on file    Inability: Not on file   Transportation needs    Medical: Not on file    Non-medical: Not on file  Tobacco Use   Smoking status: Current Every Day Smoker     Packs/day: 1.00    Years: 88.00    Pack years: 88.00   Smokeless tobacco: Never Used   Tobacco comment: Currently smoker 1ppd  Substance and Sexual Activity   Alcohol use: Yes    Comment: 1-2 glasses of Wine per day   Drug use: No   Sexual activity: Not on file  Lifestyle   Physical activity    Days per week: Not on file    Minutes per session: Not on file   Stress: Not on file  Relationships   Social connections    Talks on phone: Not on file    Gets together: Not on file    Attends religious service: Not on file    Active member of club or organization: Not on file    Attends meetings of clubs or organizations: Not on file    Relationship status: Not on file   Intimate partner violence  Fear of current or ex partner: Not on file    Emotionally abused: Not on file    Physically abused: Not on file    Forced sexual activity: Not on file  Other Topics Concern   Not on file  Social History Narrative   Not on file    Family History:   The patient's family history includes AAA (abdominal aortic aneurysm) in her father; Cancer in her brother; Diabetes in her brother; Heart attack in her mother; Heart disease in her mother; Hypertension in her sister.    ROS:  Please see the history of present illness.  All other ROS reviewed and negative.     Physical Exam/Data:   Vitals:   11/21/18 0106 11/21/18 0608 11/21/18 0827 11/21/18 1350  BP: (!) 149/75 (!) 142/66 (!) 157/66 (!) 126/53  Pulse: 89 80 83 97  Resp: 18 17 14 16   Temp: 97.8 F (36.6 C)     TempSrc: Oral     SpO2: 95% 94% 96% 96%    Intake/Output Summary (Last 24 hours) at 11/21/2018 1353 Last data filed at 11/21/2018 1121 Gross per 24 hour  Intake 1000 ml  Output --  Net 1000 ml   There were no vitals filed for this visit. There is no height or weight on file to calculate BMI.  General:  Well nourished, well developed, in no acute distress HEENT: normal Lymph: no adenopathy Neck: no  JVD Endocrine:  No thryomegaly Vascular: No carotid bruits; FA pulses 2+ bilaterally without bruits  Cardiac:  normal S1, S2; RRR; systolic murmur (2-3 4/6) throughout Lungs:  clear to auscultation bilaterally, no wheezing, rhonchi or rales  Abd: soft, nontender, no hepatomegaly  Ext: no edema Musculoskeletal:  No deformities, BUE and BLE strength normal and equal Skin: warm and dry  Neuro:  CNs 2-12 intact, no focal abnormalities noted Psych:  Normal affect    EKG:  The ECG that was done  was personally reviewed and demonstrates old T wave inversion in aVL  Relevant CV Studies: 06/20/2018-coronary angiography  1.  Mild to moderate nonobstructive stenosis in the left main, LAD, and left circumflex distributions 2.  Severe stenosis of the ostium, proximal portion, and midportion of the RCA, treated successfully with complex PCI using overlapping drug-eluting stents in the mid vessel and overlapping drug-eluting stents in the proximal/ostial vessel 3.  Normal LV systolic function by echo with normal LVEDP  Recommendations: Aggressive medical therapy, dual antiplatelet therapy without interruption using aspirin and ticagrelor for a minimum of 12 months.   06/17/2018-echocardiogram  1. The left ventricle has hyperdynamic systolic function, with an ejection fraction of >65%. The cavity size was normal. There is mildly increased left ventricular wall thickness. Left ventricular diastolic Doppler parameters are consistent with  impaired relaxation.  2. The right ventricle has normal systolic function. The cavity was normal. There is no increase in right ventricular wall thickness.  3. The aortic root and ascending aorta are normal in size and structure.  4. The interatrial septum was not assessed.  Laboratory Data:  Chemistry Recent Labs  Lab 11/21/18 0137  NA 132*  K 4.8  CL 98  CO2 21*  GLUCOSE 217*  BUN 10  CREATININE 0.89  CALCIUM 9.0  GFRNONAA >60  GFRAA >60  ANIONGAP 13     Recent Labs  Lab 11/21/18 0137  PROT 7.1  ALBUMIN 3.9  AST 19  ALT 24  ALKPHOS 68  BILITOT 0.5   Hematology Recent Labs  Lab 11/21/18  0137  WBC 9.6  RBC 4.49  HGB 14.4  HCT 42.8  MCV 95.3  MCH 32.1  MCHC 33.6  RDW 13.5  PLT 300   Cardiac EnzymesNo results for input(s): TROPONINI in the last 168 hours. No results for input(s): TROPIPOC in the last 168 hours.  BNPNo results for input(s): BNP, PROBNP in the last 168 hours.  DDimer No results for input(s): DDIMER in the last 168 hours.  Radiology/Studies:  Dg Chest 2 View  Result Date: 11/21/2018 CLINICAL DATA:  Syncope EXAM: CHEST - 2 VIEW COMPARISON:  06/16/2018 FINDINGS: Cardiomegaly, aortic atherosclerosis. Heterogeneous opacity of the lingula, similar in appearance to prior examination dated 06/16/2018. Disc degenerative disease of the thoracic spine. IMPRESSION: 1. Heterogeneous opacity of the lingula, similar in appearance to prior examination dated 06/16/2018, which may reflect airspace disease or atelectasis. 2.  Cardiomegaly. Electronically Signed   By: Lauralyn Primes M.D.   On: 11/21/2018 09:34   Ct Head Wo Contrast  Result Date: 11/21/2018 CLINICAL DATA:  Altered level of consciousness, fall with question of head trauma and confusion. EXAM: CT HEAD WITHOUT CONTRAST TECHNIQUE: Contiguous axial images were obtained from the base of the skull through the vertex without intravenous contrast. COMPARISON:  01/10/2016 FINDINGS: Brain: No evidence of acute infarction, hemorrhage, hydrocephalus, extra-axial collection or mass lesion/mass effect. Signs of generalized atrophy and chronic microvascular ischemic change not changed since prior exam. Vascular: No hyperdense vessel or unexpected calcification. Skull: Stable opacification of left mastoid air cells. No signs of acute fracture or destructive lesion. Sinuses/Orbits: No acute finding. Other: None. IMPRESSION: No acute intracranial finding. Electronically Signed   By:  Donzetta Kohut M.D.   On: 11/21/2018 10:00   Sarah Francis is a 72 y.o. female  with a history of hypertension, type 2 diabetes mellitus, coronary artery disease status post PCI with overlapping stent to proximal and mid RCA with residual mild to moderate nonobstructive disease of the left main (40%), 60% residual disease of the LAD and 50% proximal circumflex (May 2020), tobacco use disorder, peripheral arterial disease  Assessment and Plan:   #Questionable syncopal episode with transient altered mental status likely 2/2 orthostatic hypotension She presents after a brief episode of altered mental status that was witnessed by her daughter.  There were no preceding cardiac symptoms.  Differential diagnosis includes vasovagal syncope as patient reports of "standing up too fast."  Orthostatic hypotension also possibility as daughter had report of her mild not eating.  Seizure disorder very less likely given no abnormal body movements, urinary or bowel incontinence.  Cerebrovascular accident/TIA also very less likely as she is currently without any focal neurological weakness nor does she endorse any preceding neurological symptoms.  Cardiac Dysrhythmia is also possibility given her history of coronary artery disease.  Aortic dissection also less likely given normal AP diameter on chest x-ray and patient without symptoms.  Infectious process also very less likely as she denies any pulmonary or urinary symptoms.  -Will arrange for outpatient 30 day holter monitor, echocardiogram and carotid dopplers  -Advised to increase oral fluid intake   #Coronary artery disease status post PCI with DES to RCA Currently denies chest pain, shortness of breath, dizziness, lightheadedness, nausea, vomiting.-High-sensitivity troponin mildly elevated 36>> 43>> 33.  EKG without any acute ST changes. -Continue aspirin, Lipitor, as needed nitroglycerin, Brilinta, metoprolol  #Hypertension -Continue metoprolol,  lisinopril  #Hyperlipidemia -Continue Lipitor  #Insulin-dependent diabetes mellitus -Continue home regimen   For questions or updates, please contact [facility] Please consult [email or web address]  for contact info under Cardiology/STEMI.    Signed, Yvette Rack, MD  11/21/2018 1:53 PM    ATTENDING ATTESTATION  I have seen, examined and evaluated the patient this PM in the ER along with the Resident.  After reviewing all the available data and chart, we discussed the patients laboratory, study & physical findings as well as symptoms in detail. I agree with his findings, examination as well as impression recommendations as per our discussion.    Sarah Francis comes in with a single episode of syncope.  No symptoms of rapid irregular heartbeats or palpitations leading up to this.  Most likely etiology is orthostatic vasovagal type syncope although true etiology is difficult to find out.  No signs or symptoms to suggest recurrent angina or ischemic symptoms.  No heart failure. Exam is relatively benign. Currently blood pressure controlled and nothing on telemetry.  EKG stable..   At this point I think it is okay for her to be discharged home with plan for outpatient 30-day event monitor to exclude arrhythmia, 2D echo to see if any change in function-she also has an aortic murmur but no comment on aortic sclerosis or stenosis on initial echo. Would also consider right carotid Dopplers as she does have what sounds like radiating aortic murmur in the carotids but could be bruits.  Cannot exclude TIA and therefore evaluating carotid Dopplers to look for the carotids and vertebral arteries is reasonable.  This can be done as an outpatient.  We will arrange follow-up with Dr. Mayford Knife or her team.    Bryan Lemma, M.D., M.S. Interventional Cardiologist   Pager # [phone] Phone # [phone] [address] [place], [place] [address]

## 2018-11-21 NOTE — ED Notes (Signed)
ED Provider at bedside. 

## 2018-11-22 ENCOUNTER — Encounter (HOSPITAL_COMMUNITY): Payer: Self-pay | Admitting: Cardiology

## 2018-11-30 ENCOUNTER — Telehealth: Payer: Self-pay

## 2018-11-30 NOTE — Telephone Encounter (Signed)
Spoke to pt, went over monitor instructions. Verified address. 30 day [employer] monitor ordered.   Pt is having Carotid u/s & Echo tomorrow. She said she might wait until she gets those results before she puts the monitor on. I instructed her that this was different than those test and she should really consider wearing it. If she decides not to she will return the monitor.

## 2018-12-01 ENCOUNTER — Other Ambulatory Visit: Payer: Self-pay

## 2018-12-01 ENCOUNTER — Ambulatory Visit (HOSPITAL_COMMUNITY)
Admission: RE | Admit: 2018-12-01 | Discharge: 2018-12-01 | Disposition: A | Discharge status: Home / Self Care | Payer: Medicare Other | Source: Ambulatory Visit | Attending: Internal Medicine | Admitting: Internal Medicine

## 2018-12-01 ENCOUNTER — Ambulatory Visit (HOSPITAL_BASED_OUTPATIENT_CLINIC_OR_DEPARTMENT_OTHER): Payer: Medicare Other

## 2018-12-01 DIAGNOSIS — R55 Syncope and collapse: Secondary | ICD-10-CM | POA: Diagnosis not present

## 2018-12-05 ENCOUNTER — Telehealth: Payer: Self-pay | Admitting: Cardiology

## 2018-12-05 NOTE — Telephone Encounter (Signed)
New Message   I called patient to schedule a f/u appt after her monitor. At the time of the call the patient stated on yesterday her foot turned blue and was swollen. She is not sure if its a reaction to medication or not. She just wanted to let the doctor. She was going to wait until tomorrow and call to see if the swelling went down but since I called she figured she would mention what was going on.

## 2018-12-05 NOTE — Telephone Encounter (Signed)
I spoke to the patient who is calling because she twisted her left ankle 3 weeks ago and yesterday 11/9, it developed two purple spots on the top of the toes and then progressed to "black" last night on top of foot.  The ankle is also swollen.  This morning it lightened to blue and the swelling dissipated, but is sore to the touch.  She also noticed lately that she does bruise more often and that if she cuts herself, she bleeds a lot.  I recommended that she reach out to her PCP, but she would rather monitor today and call in morning if coloring/swelling doesn't improve.  She verbalized understanding.

## 2018-12-07 ENCOUNTER — Encounter (HOSPITAL_COMMUNITY): Payer: Self-pay | Admitting: Emergency Medicine

## 2018-12-07 ENCOUNTER — Emergency Department (HOSPITAL_COMMUNITY): Payer: Medicare Other

## 2018-12-07 ENCOUNTER — Emergency Department (HOSPITAL_COMMUNITY)
Admission: EM | Admit: 2018-12-07 | Discharge: 2018-12-07 | Disposition: A | Discharge status: Home / Self Care | Payer: Medicare Other | Attending: Emergency Medicine | Admitting: Emergency Medicine

## 2018-12-07 ENCOUNTER — Encounter (HOSPITAL_COMMUNITY): Payer: Self-pay | Admitting: Obstetrics and Gynecology

## 2018-12-07 ENCOUNTER — Other Ambulatory Visit: Payer: Self-pay

## 2018-12-07 ENCOUNTER — Emergency Department (HOSPITAL_COMMUNITY)
Admission: EM | Admit: 2018-12-07 | Discharge: 2018-12-08 | Disposition: A | Discharge status: Home / Self Care | Payer: Medicare Other | Source: Home / Self Care | Attending: Emergency Medicine | Admitting: Emergency Medicine

## 2018-12-07 DIAGNOSIS — M79672 Pain in left foot: Secondary | ICD-10-CM | POA: Diagnosis present

## 2018-12-07 DIAGNOSIS — Y939 Activity, unspecified: Secondary | ICD-10-CM | POA: Insufficient documentation

## 2018-12-07 DIAGNOSIS — I252 Old myocardial infarction: Secondary | ICD-10-CM | POA: Insufficient documentation

## 2018-12-07 DIAGNOSIS — Z5321 Procedure and treatment not carried out due to patient leaving prior to being seen by health care provider: Secondary | ICD-10-CM | POA: Insufficient documentation

## 2018-12-07 DIAGNOSIS — Y999 Unspecified external cause status: Secondary | ICD-10-CM | POA: Insufficient documentation

## 2018-12-07 DIAGNOSIS — Z7982 Long term (current) use of aspirin: Secondary | ICD-10-CM | POA: Insufficient documentation

## 2018-12-07 DIAGNOSIS — S9031XA Contusion of right foot, initial encounter: Secondary | ICD-10-CM | POA: Insufficient documentation

## 2018-12-07 DIAGNOSIS — F17200 Nicotine dependence, unspecified, uncomplicated: Secondary | ICD-10-CM | POA: Insufficient documentation

## 2018-12-07 DIAGNOSIS — Z79899 Other long term (current) drug therapy: Secondary | ICD-10-CM | POA: Insufficient documentation

## 2018-12-07 DIAGNOSIS — I10 Essential (primary) hypertension: Secondary | ICD-10-CM | POA: Insufficient documentation

## 2018-12-07 DIAGNOSIS — E119 Type 2 diabetes mellitus without complications: Secondary | ICD-10-CM | POA: Insufficient documentation

## 2018-12-07 DIAGNOSIS — R58 Hemorrhage, not elsewhere classified: Secondary | ICD-10-CM

## 2018-12-07 DIAGNOSIS — Y929 Unspecified place or not applicable: Secondary | ICD-10-CM | POA: Insufficient documentation

## 2018-12-07 DIAGNOSIS — X58XXXA Exposure to other specified factors, initial encounter: Secondary | ICD-10-CM | POA: Insufficient documentation

## 2018-12-07 DIAGNOSIS — I2510 Atherosclerotic heart disease of native coronary artery without angina pectoris: Secondary | ICD-10-CM | POA: Insufficient documentation

## 2018-12-07 DIAGNOSIS — Z794 Long term (current) use of insulin: Secondary | ICD-10-CM | POA: Insufficient documentation

## 2018-12-07 LAB — COMPREHENSIVE METABOLIC PANEL
ALT: 18 U/L (ref 0–44)
AST: 16 U/L (ref 15–41)
Albumin: 3.7 g/dL (ref 3.5–5.0)
Alkaline Phosphatase: 87 U/L (ref 38–126)
Anion gap: 11 (ref 5–15)
BUN: 10 mg/dL (ref 8–23)
CO2: 26 mmol/L (ref 22–32)
Calcium: 9 mg/dL (ref 8.9–10.3)
Chloride: 97 mmol/L — ABNORMAL LOW (ref 98–111)
Creatinine, Ser: 0.97 mg/dL (ref 0.44–1.00)
GFR calc Af Amer: >60 mL/min (ref >60)
GFR calc non Af Amer: 59 mL/min — ABNORMAL LOW (ref >60)
Glucose, Bld: 265 mg/dL — ABNORMAL HIGH (ref 70–99)
Potassium: 4.6 mmol/L (ref 3.5–5.1)
Sodium: 134 mmol/L — ABNORMAL LOW (ref 135–145)
Total Bilirubin: 0.7 mg/dL (ref 0.3–1.2)
Total Protein: 6.6 g/dL (ref 6.5–8.1)

## 2018-12-07 LAB — CBC WITH DIFFERENTIAL/PLATELET
Abs Immature Granulocytes: 0.06 10*3/uL (ref 0.00–0.07)
Basophils Absolute: 0 10*3/uL (ref 0.0–0.1)
Basophils Relative: 0 %
Eosinophils Absolute: 0.1 10*3/uL (ref 0.0–0.5)
Eosinophils Relative: 1 %
HCT: 39.9 % (ref 36.0–46.0)
Hemoglobin: 13.6 g/dL (ref 12.0–15.0)
Immature Granulocytes: 1 %
Lymphocytes Relative: 10 %
Lymphs Abs: 1.1 10*3/uL (ref 0.7–4.0)
MCH: 32.8 pg (ref 26.0–34.0)
MCHC: 34.1 g/dL (ref 30.0–36.0)
MCV: 96.1 fL (ref 80.0–100.0)
Monocytes Absolute: 1.1 10*3/uL — ABNORMAL HIGH (ref 0.1–1.0)
Monocytes Relative: 10 %
Neutro Abs: 8.7 10*3/uL — ABNORMAL HIGH (ref 1.7–7.7)
Neutrophils Relative %: 78 %
Platelets: 286 10*3/uL (ref 150–400)
RBC: 4.15 MIL/uL (ref 3.87–5.11)
RDW: 13.5 % (ref 11.5–15.5)
WBC: 11.1 10*3/uL — ABNORMAL HIGH (ref 4.0–10.5)
nRBC: 0 % (ref 0.0–0.2)

## 2018-12-07 NOTE — Telephone Encounter (Signed)
The patient is calling to give [place] an update on her left foot/ankle.  She went to her PCP today and saw the PA because the foot remains swollen and her foot is still "black & blue"/sore to the touch.  They recommended that she goes to the ED for further evaluation.  The patient said that they reached out to [facility], but could not get her in today.  She is making her way to the ED and will keep [place] updated.

## 2018-12-07 NOTE — ED Triage Notes (Signed)
Pt states she was sent here for her left foot turning black. Pt noticed this on Sunday night. It swells at night. Black noted around base of toes. Pt states she has sensation present. She states right foot is starting to feel this way as well.

## 2018-12-07 NOTE — Telephone Encounter (Signed)
Follow Up  Patient is calling back in pertaining to her foot. Please give patient a call back to discuss.

## 2018-12-07 NOTE — ED Provider Notes (Signed)
[facility] Note   CSN: [id number] Arrival date & time: 12/07/18  2127     History   Chief Complaint Chief Complaint  Patient presents with   Foot Pain    HPI Sarah Francis is a 73 y.o. female.     Patient to ED with complaint of discoloration and pain in left foot without known injury. Symptoms started over the last 4 days with "black" discoloration and she reports mild discomfort. Today, there is an area of redness to the top of the foot that is tender. She talked to her primary care doctor who advised her to seek emergent care.  The history is provided by the patient and a relative. No language interpreter was used.  Foot Pain    Past Medical History:  Diagnosis Date   Anxiety    Aortic atherosclerosis (HCC)    Coronary artery calcification seen on CAT scan 05/17/2016   Diabetes mellitus without complication (HCC)    GERD (gastroesophageal reflux disease)    Hypertension    Major depression    initial diagnosis/medication around age 33   NSTEMI (non-ST elevated myocardial infarction) (HCC)    06/20/18 PCI/DES overlapping stents to p/m RCA, mild disease in LAD, and Lcx   Osteoporosis    w h/o sacral fracture    Patient Active Problem List   Diagnosis Date Noted   CAD (coronary artery disease) 06/28/2018   Family history of early CAD 06/28/2018   NSTEMI (non-ST elevated myocardial infarction) (HCC) 06/16/2018   Peripheral arterial disease (HCC) 06/25/2016   Aortic atherosclerosis (HCC)    Coronary artery calcification seen on CAT scan 05/17/2016   Claudication (HCC) 05/17/2016   Heart murmur 05/17/2016   Chest pain 12/25/2012   Essential hypertension    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   Polyneuropathy in diabetes(357.2) 12/22/2012   Plantar fasciitis, bilateral 12/22/2012   Mixed hyperlipidemia 12/22/2012   Osteoporosis, unspecified 12/22/2012   Vitamin D deficiency  12/22/2012   Restless legs syndrome (RLS) 12/22/2012   Anxiety 12/22/2012   Sleep disturbance 12/22/2012   Tobacco use disorder 12/22/2012   Alcohol dependence (HCC) 12/22/2012    Past Surgical History:  Procedure Laterality Date   ABDOMINAL HYSTERECTOMY     CORONARY STENT INTERVENTION N/A 06/20/2018   Procedure: CORONARY STENT INTERVENTION;  Surgeon: Tonny Bollman, MD;  Location: [facility] INVASIVE CV LAB;  Service: Cardiovascular;  Laterality: N/A;   KNEE SURGERY Right 2012   LEFT HEART CATH AND CORONARY ANGIOGRAPHY N/A 06/20/2018   Procedure: LEFT HEART CATH AND CORONARY ANGIOGRAPHY;  Surgeon: Tonny Bollman, MD;  Location: [facility] INVASIVE CV LAB;  Service: Cardiovascular;  Laterality: N/A;     OB History   No obstetric history on file.      Home Medications    Prior to Admission medications   Medication Sig Start Date End Date Taking? Authorizing Provider  aspirin EC 81 MG tablet Take 81 mg by mouth daily.   Yes [provider]  atorvastatin (LIPITOR) 80 MG tablet TAKE 1 TABLET BY MOUTH EVERY DAY AT 6PM Patient taking differently: Take 80 mg by mouth daily.  11/15/18  Yes Turner, Cornelious Bryant, MD  cetirizine (ZYRTEC) 10 MG tablet Take 10 mg by mouth every morning.   Yes [provider]  cholecalciferol (VITAMIN D3) 25 MCG (1000 UT) tablet Take 2,000 Units by mouth daily.   Yes [provider]  cycloSPORINE (RESTASIS) 0.05 % ophthalmic emulsion Place 1 drop into both eyes 2 (two)  times daily.   Yes [provider]  DULoxetine (CYMBALTA) 60 MG capsule Take 60 mg by mouth daily. 10/30/18  Yes [provider]  Insulin Glargine (LANTUS SOLOSTAR) 100 UNIT/ML Solostar Pen Inject 15-35 Units into the skin 2 (two) times daily. 15 units in the morning and 35 units in the evening   Yes [provider]  Insulin Lispro (HUMALOG KWIKPEN [place]) Inject 8-12 Units into the skin 3 (three) times daily. 8 units with breakfast, 10 units with lunch and 12  units with dinner   Yes [provider]  lisinopril (ZESTRIL) 20 MG tablet Take 1 tablet (20 mg total) by mouth daily. 07/12/18 07/12/19 Yes Turner, Cornelious Bryantraci R, MD  metoprolol tartrate (LOPRESSOR) 50 MG tablet Take 1 tablet (50 mg total) by mouth 2 (two) times daily. 09/13/18  Yes Turner, Cornelious Bryantraci R, MD  nicotine (NICODERM CQ) 21 mg/24hr patch Place 1 patch (21 mg total) onto the skin daily. 06/28/18  Yes Dyann KiefLenze, Michele M, PA-C  omeprazole (PRILOSEC) 20 MG capsule Take 20 mg by mouth daily.   Yes [provider]  thiamine (VITAMIN B-1) 100 MG tablet Take 100 mg by mouth daily.   Yes [provider]  ticagrelor (BRILINTA) 90 MG TABS tablet Take 1 tablet (90 mg total) by mouth 2 (two) times daily. 06/21/18  Yes Arty Baumgartneroberts, Lindsay B, NP  nitroGLYCERIN (NITROSTAT) 0.4 MG SL tablet Place 1 tablet (0.4 mg total) under the tongue every 5 (five) minutes x 3 doses as needed for chest pain. 06/21/18   Arty Baumgartneroberts, Lindsay B, NP    Family History Family History  Problem Relation Age of Onset   Heart attack Mother    Heart disease Mother    AAA (abdominal aortic aneurysm) Father    Cancer Brother    Hypertension Sister    Diabetes Brother     Social History Social History   Tobacco Use   Smoking status: Current Every Day Smoker    Packs/day: 1.00    Years: 88.00    Pack years: 88.00   Smokeless tobacco: Never Used   Tobacco comment: Currently smoker 1ppd  Substance Use Topics   Alcohol use: Yes    Comment: 1-2 glasses of Wine per day   Drug use: No     Allergies   Penicillins   Review of Systems Review of Systems  Constitutional: Negative for fever.  Musculoskeletal:       See HPI.  Skin: Positive for color change. Negative for wound.  Neurological: Positive for numbness (H/O diabetic neuropathy, unchanged).     Physical Exam Updated Vital Signs BP (!) 194/84    Pulse 73    Temp 98.1 F (36.7 C) (Oral)    Resp 18    SpO2 100%   Physical  Exam Constitutional:      Appearance: She is well-developed.  Neck:     Musculoskeletal: Normal range of motion.  Pulmonary:     Effort: Pulmonary effort is normal.  Musculoskeletal:     Comments: Left foot is ecchymotic across the distal and proximal dorsal forefoot, and the lateral foot surface. No deformity or wound. There is a central area of redness that is tender but not indurated or fluctuant. DP pulses are palpable. Cool to touch but equal to right foot. Right foot has minimal ecchymosis in similar distribution.  Skin:    General: Skin is warm and dry.  Neurological:     Mental Status: She is alert and oriented to person, place, and  time.      ED Treatments / Results  Labs (all labs ordered are listed, but only abnormal results are displayed) Labs Reviewed - No data to display  EKG None  Radiology No results found. Dg Chest 2 View  Result Date: 11/21/2018 CLINICAL DATA:  Syncope EXAM: CHEST - 2 VIEW COMPARISON:  06/16/2018 FINDINGS: Cardiomegaly, aortic atherosclerosis. Heterogeneous opacity of the lingula, similar in appearance to prior examination dated 06/16/2018. Disc degenerative disease of the thoracic spine. IMPRESSION: 1. Heterogeneous opacity of the lingula, similar in appearance to prior examination dated 06/16/2018, which may reflect airspace disease or atelectasis. 2.  Cardiomegaly. Electronically Signed   By: Lauralyn Primes M.D.   On: 11/21/2018 09:34   Ct Head Wo Contrast  Result Date: 11/21/2018 CLINICAL DATA:  Altered level of consciousness, fall with question of head trauma and confusion. EXAM: CT HEAD WITHOUT CONTRAST TECHNIQUE: Contiguous axial images were obtained from the base of the skull through the vertex without intravenous contrast. COMPARISON:  01/10/2016 FINDINGS: Brain: No evidence of acute infarction, hemorrhage, hydrocephalus, extra-axial collection or mass lesion/mass effect. Signs of generalized atrophy and chronic microvascular ischemic  change not changed since prior exam. Vascular: No hyperdense vessel or unexpected calcification. Skull: Stable opacification of left mastoid air cells. No signs of acute fracture or destructive lesion. Sinuses/Orbits: No acute finding. Other: None. IMPRESSION: No acute intracranial finding. Electronically Signed   By: Donzetta Kohut M.D.   On: 11/21/2018 10:00   Dg Foot Complete Left  Result Date: 12/07/2018 CLINICAL DATA:  Pain and discoloration EXAM: LEFT FOOT - COMPLETE 3+ VIEW COMPARISON:  None. FINDINGS: There is no evidence of fracture or dislocation. There is no evidence of arthropathy or other focal bone abnormality. Soft tissues are unremarkable. IMPRESSION: Negative. Electronically Signed   By: Katherine Mantle M.D.   On: 12/07/2018 23:46   Vas US Carotid  Result Date: 12/01/2018 Carotid Arterial Duplex Study Indications:   Carotid artery disease and Syncope. Risk Factors:  Hypertension, hyperlipidemia, Diabetes, current smoker, coronary                artery disease. Other Factors: In October, patient states she had passed out and when she woke                up, her daughter asked her questions and patient did not remember                that she lived in [place] and forgot that her mother in                law passed away in 2012/06/12. She then went to the ER. Performing Technologist: Dondra Prader RVT  Examination Guidelines: A complete evaluation includes B-mode imaging, spectral Doppler, color Doppler, and power Doppler as needed of all accessible portions of each vessel. Bilateral testing is considered an integral part of a complete examination. Limited examinations for reoccurring indications may be performed as noted.  Right Carotid Findings: +----------+--------+--------+--------+--------------------------+---------+             PSV cm/s EDV cm/s Stenosis Plaque Description         Comments   +----------+--------+--------+--------+--------------------------+---------+  CCA Prox   62        8                                                       +----------+--------+--------+--------+--------------------------+---------+  CCA Mid    55       12                heterogenous and irregular            +----------+--------+--------+--------+--------------------------+---------+  CCA Distal 51       9                 heterogenous and irregular            +----------+--------+--------+--------+--------------------------+---------+  ICA Prox   246      62       40-59%   heterogenous and irregular Shadowing  +----------+--------+--------+--------+--------------------------+---------+  ICA Mid    258      42                                                      +----------+--------+--------+--------+--------------------------+---------+  ICA Distal 99       23                                                      +----------+--------+--------+--------+--------------------------+---------+  ECA        131      14                                                      +----------+--------+--------+--------+--------------------------+---------+ +----------+--------+-------+---------+-------------------+             PSV cm/s EDV cms Describe  Arm Pressure (mmHG)  +----------+--------+-------+---------+-------------------+  Subclavian 201              Turbulent 150                  +----------+--------+-------+---------+-------------------+ +---------+--------+--+--------+-+---------+  Vertebral PSV cm/s 33 EDV cm/s 8 Antegrade  +---------+--------+--+--------+-+---------+ The RICA velocities are elevated; however, there is significant amount of plaque noted in the proximal segment and acoustic shadowing that makes it difficult to determine a more significant stenosis. Left Carotid Findings: +----------+--------+--------+--------+--------------------------+---------+             PSV cm/s EDV cm/s Stenosis Plaque Description         Comments   +----------+--------+--------+--------+--------------------------+---------+   CCA Prox   78       14                                                      +----------+--------+--------+--------+--------------------------+---------+  CCA Mid    63       15                heterogenous and irregular            +----------+--------+--------+--------+--------------------------+---------+  CCA Distal 62       14                heterogenous and irregular Shadowing  +----------+--------+--------+--------+--------------------------+---------+  ICA Prox  202      46       40-59%   heterogenous and irregular Shadowing  +----------+--------+--------+--------+--------------------------+---------+  ICA Mid    102      18                                                      +----------+--------+--------+--------+--------------------------+---------+  ICA Distal 76       19                                                      +----------+--------+--------+--------+--------------------------+---------+  ECA        208      27       >50%     heterogenous and irregular            +----------+--------+--------+--------+--------------------------+---------+ +----------+--------+--------+----------------+-------------------+             PSV cm/s EDV cm/s Describe         Arm Pressure (mmHG)  +----------+--------+--------+----------------+-------------------+  Subclavian 162               Multiphasic, WNL 150                  +----------+--------+--------+----------------+-------------------+ +---------+--------+--+--------+--+---------+  Vertebral PSV cm/s 32 EDV cm/s 11 Antegrade  +---------+--------+--+--------+--+---------+  Summary: Right Carotid: Velocities in the right ICA are consistent with a 40-59%                stenosis. Non-hemodynamically significant plaque <50% noted in                the CCA. Left Carotid: Velocities in the left ICA are consistent with a 40-59% stenosis.               Non-hemodynamically significant plaque <50% noted in the CCA. The               ECA appears >50% stenosed.  Vertebrals:  Bilateral vertebral arteries demonstrate antegrade flow. Subclavians: Normal flow hemodynamics were seen in bilateral subclavian              arteries. *See table(s) above for measurements and observations. Suggest follow up study in 12 months. Electronically signed by Jenkins Rouge MD on 12/01/2018 at 1:55:18 PM.    Final     Procedures Procedures (including critical care time)  Medications Ordered in ED Medications - No data to display   Initial Impression / Assessment and Plan / ED Course  I have reviewed the triage vital signs and the nursing notes.  Pertinent labs & imaging results that were available during my care of the patient were reviewed by me and considered in my medical decision making (see chart for details).        Patient to ED for evaluation of discoloration to left foot, concern for vascular compromise.   On exam she has palpable DP pulse, diminished when compared to right but present. PT pulses strong bilaterally by doppler. Discoloration to foot appear to be bruising not secondary to ischemia.   She has been seen and evaluated by Dr. Dayna Barker and is felt appropriate for discharge home. Follow up with Vascular for outpatient vascular studies recommended  and referral will be made.   Final Clinical Impressions(s) / ED Diagnoses   Final diagnoses:  None   1. Ecchymosis left foot  ED Discharge Orders    None       Danne Harbor 12/08/18 0207    Bethann Berkshire, MD 12/08/18 [phone]

## 2018-12-07 NOTE — ED Triage Notes (Signed)
Pt reports to the ED for her foot to be evaluated. Patient reports she is here from [place]. She has decreased dorsalis pedis pulse, cap refill is increased, and foot is discolored and cool to touch. Patient was sent from [facility] for CT scan of foot.

## 2018-12-07 NOTE — ED Notes (Signed)
MD made aware of being unable to find pulse on pts left foot.

## 2018-12-08 LAB — CBC WITH DIFFERENTIAL/PLATELET
Abs Immature Granulocytes: 0.06 10*3/uL (ref 0.00–0.07)
Basophils Absolute: 0.1 10*3/uL (ref 0.0–0.1)
Basophils Relative: 1 %
Eosinophils Absolute: 0.2 10*3/uL (ref 0.0–0.5)
Eosinophils Relative: 1 %
HCT: 41 % (ref 36.0–46.0)
Hemoglobin: 13.9 g/dL (ref 12.0–15.0)
Immature Granulocytes: 1 %
Lymphocytes Relative: 15 %
Lymphs Abs: 1.9 10*3/uL (ref 0.7–4.0)
MCH: 32.7 pg (ref 26.0–34.0)
MCHC: 33.9 g/dL (ref 30.0–36.0)
MCV: 96.5 fL (ref 80.0–100.0)
Monocytes Absolute: 1.3 10*3/uL — ABNORMAL HIGH (ref 0.1–1.0)
Monocytes Relative: 10 %
Neutro Abs: 9.4 10*3/uL — ABNORMAL HIGH (ref 1.7–7.7)
Neutrophils Relative %: 72 %
Platelets: 265 10*3/uL (ref 150–400)
RBC: 4.25 MIL/uL (ref 3.87–5.11)
RDW: 13.5 % (ref 11.5–15.5)
WBC: 12.9 10*3/uL — ABNORMAL HIGH (ref 4.0–10.5)
nRBC: 0 % (ref 0.0–0.2)

## 2018-12-08 LAB — APTT: aPTT: 27 seconds (ref 24–36)

## 2018-12-08 LAB — PROTIME-INR
INR: 1 (ref 0.8–1.2)
Prothrombin Time: 12.5 seconds (ref 11.4–15.2)

## 2018-12-08 NOTE — ED Provider Notes (Signed)
Medical screening examination/treatment/procedure(s) were conducted as a shared visit with non-physician practitioner(s) and myself.  I personally evaluated the patient during the encounter.  Patient is at worsening bruising and general son started on Brilinta recently.  She was sent by her PCP today as they cannot feel pulse in her left foot and thought it was cold and were concerned about arterial occlusion.  Patient without severe pain to her foot.  On my examination patient does have discolored foot but it appears more ecchymotic than anything.  She has a palpable pulse on the left DP however is less so than the right.  This could be just related to the edema possibly.  She has dopplerable equal PT pulses.  Her feet are similar in temperature.  Cap refill is difficult to assess secondary to the amount of ecchymosis. Patient deftly needs to see a vascular surgeon as she has diminished flow in her left dorsalis pedis artery however she is well perfused at this time and no indication for emergent work-up or evaluation by vascular surgery.  Referral put in.  Also she will discuss her cardiology about the significant bruising while on Brilinta.        Olaf Mesa, Corene Cornea, MD 12/09/18 [phone]

## 2018-12-08 NOTE — Discharge Instructions (Addendum)
Follow up with a vascular specialist has been started. If you do not hear from their office by Monday, please contact them for appointment scheduling.   Return to the emergency department with any new or worsening symptoms.

## 2018-12-08 NOTE — ED Notes (Signed)
Pt ambulatory on discharge. Pt verbalizes understanding of discharge paperwork and follow up appointments. All questions were answered and has no more questions at this time.

## 2018-12-13 ENCOUNTER — Ambulatory Visit (INDEPENDENT_AMBULATORY_CARE_PROVIDER_SITE_OTHER): Payer: Medicare Other

## 2018-12-13 DIAGNOSIS — R55 Syncope and collapse: Secondary | ICD-10-CM

## 2018-12-18 ENCOUNTER — Telehealth: Payer: Self-pay

## 2018-12-18 NOTE — Telephone Encounter (Signed)
Spoke to pt about being seen by Dr Gwenlyn Found. Double-booked for 11/25 at 4pm per Dr Gwenlyn Found request

## 2018-12-20 ENCOUNTER — Other Ambulatory Visit: Payer: Self-pay

## 2018-12-20 ENCOUNTER — Encounter: Payer: Self-pay | Admitting: Cardiovascular Disease

## 2018-12-20 ENCOUNTER — Ambulatory Visit (INDEPENDENT_AMBULATORY_CARE_PROVIDER_SITE_OTHER): Payer: Medicare Other | Admitting: Cardiovascular Disease

## 2018-12-20 VITALS — BP 126/70 | HR 75 | Ht 61.0 in | Wt 161.4 lb

## 2018-12-20 DIAGNOSIS — I739 Peripheral vascular disease, unspecified: Secondary | ICD-10-CM

## 2018-12-20 NOTE — Patient Instructions (Signed)
Medication Instructions:  No changes *If you need a refill on your cardiac medications before your next appointment, please call your pharmacy*  Lab Work: None ordered If you have labs (blood work) drawn today and your tests are completely normal, you will receive your results only by: Marland Kitchen MyChart Message (if you have MyChart) OR . A paper copy in the mail If you have any lab test that is abnormal or we need to change your treatment, we will call you to review the results.  Testing/Procedures: Your physician has requested that you have a lower extremity arterial duplex. During this test, ultrasound is used to evaluate arterial blood flow in the legs. Allow one hour for this exam. There are no restrictions or special instructions. This will take place at [address], [address].  Your physician has requested that you have an ankle brachial index (ABI). During this test an ultrasound and blood pressure cuff are used to evaluate the arteries that supply the arms and legs with blood. Allow thirty minutes for this exam. There are no restrictions or special instructions. This will take place at [address], [address].    Follow-Up: At [facility], you and your health needs are our priority.  As part of our continuing mission to provide you with exceptional heart care, we have created designated Provider Care Teams.  These Care Teams include your primary Cardiologist (physician) and Advanced Practice Providers (APPs -  Physician Assistants and Nurse Practitioners) who all work together to provide you with the care you need, when you need it.  Your next appointment:   Follow up in 2-3 weeks with Ancil Linsey, MD

## 2018-12-20 NOTE — Progress Notes (Signed)
12/20/2018 Sarah Francis Biscardi   [birth year]  [id number]  Primary Physician Gweneth DimitriMcNeill, Wendy, MD Primary Cardiologist: Runell GessJonathan J Rolanda Campa MD Nicholes CalamityFACP, FACC, FAHA, [place]  HPI:  Sarah Francis Kogler is a 73 y.o.  mildly overweight Caucasian female mother of one daughter, grandmother of 2 grandchildren referred to me by Dr. Mayford Knifeurner for peripheral vascular evaluation because of abnormal Doppler studies.  I last saw her in the office 06/25/2016.  She is a third grade teacher. Cardiac risk factors notable for 50 pack years smoking one pack per day recalcitrant to risk factor modification. She drinks 2-3 glasses of wine a night. She has treated hypertension, diabetes and hyperlipidemia as well as family history of heart disease. Mother died in her 5770s of a myocardial infarction. Her father died in his 10670s of an abdominal aortic aneurysm. She has never had a heart attack or stroke. She denies chest pain but does have some mild dyspnea. She has coronary calcification on chest CT and a negative Myoview stress test. Lower extremity Dopplers performed 06/01/16 revealed right ABI 0.74 and a left of 0.9. She has moderate bilateral proximal SFA disease which does not appear to be significant obstructive.  She was hospitalized in May and had PCI drug-eluting stenting of high-grade proximal and mid RCA disease by Dr. Excell Seltzerooper with noncritical disease otherwise.  LV function was normal.  She has complained of some purplish discoloration of the distal aspect of her left foot with some pain there recently.  Her foot is somewhat cool to touch but she really denies claudication.    Current Meds  Medication Sig  . aspirin EC 81 MG tablet Take 81 mg by mouth daily.  Marland Kitchen. atorvastatin (LIPITOR) 80 MG tablet TAKE 1 TABLET BY MOUTH EVERY DAY AT 6PM  . cetirizine (ZYRTEC) 10 MG tablet Take 10 mg by mouth every morning.  . cholecalciferol (VITAMIN D3) 25 MCG (1000 UT) tablet Take 2,000 Units by mouth daily.  . cycloSPORINE (RESTASIS) 0.05 % ophthalmic  emulsion Place 1 drop into both eyes 2 (two) times daily.  . DULoxetine (CYMBALTA) 60 MG capsule Take 60 mg by mouth daily.  . Insulin Glargine (LANTUS SOLOSTAR) 100 UNIT/ML Solostar Pen Inject 15-35 Units into the skin 2 (two) times daily. 15 units in the morning and 35 units in the evening  . Insulin Lispro (HUMALOG KWIKPEN [place]) Inject 8-12 Units into the skin 3 (three) times daily. 8 units with breakfast, 10 units with lunch and 12 units with dinner  . lisinopril (ZESTRIL) 20 MG tablet Take 1 tablet (20 mg total) by mouth daily.  . metoprolol tartrate (LOPRESSOR) 50 MG tablet Take 1 tablet (50 mg total) by mouth 2 (two) times daily.  . nicotine (NICODERM CQ) 21 mg/24hr patch Place 1 patch (21 mg total) onto the skin daily.  . nitroGLYCERIN (NITROSTAT) 0.4 MG SL tablet Place 1 tablet (0.4 mg total) under the tongue every 5 (five) minutes x 3 doses as needed for chest pain.  Marland Kitchen. omeprazole (PRILOSEC) 20 MG capsule Take 20 mg by mouth daily.  Marland Kitchen. thiamine (VITAMIN B-1) 100 MG tablet Take 100 mg by mouth daily.  . ticagrelor (BRILINTA) 90 MG TABS tablet Take 1 tablet (90 mg total) by mouth 2 (two) times daily.     Allergies  Allergen Reactions  . Penicillins Rash    Did it involve swelling of the face/tongue/throat, SOB, or low BP? Yes Did it involve sudden or severe rash/hives, skin peeling, or any reaction on the inside of your  mouth or nose? Unknown Did you need to seek medical attention at a hospital or doctor's office? Unknown When did it last happen?Pt was a child, had lip swelling If all above answers are "NO", may proceed with cephalosporin use.     Social History   Socioeconomic History  . Marital status: Single    Spouse name: Not on file  . Number of children: Not on file  . Years of education: Not on file  . Highest education level: Not on file  Occupational History  . Not on file  Social Needs  . Financial resource strain: Not on file  . Food insecurity    Worry: Not  on file    Inability: Not on file  . Transportation needs    Medical: Not on file    Non-medical: Not on file  Tobacco Use  . Smoking status: Current Every Day Smoker    Packs/day: 1.00    Years: 88.00    Pack years: 88.00  . Smokeless tobacco: Never Used  . Tobacco comment: Currently smoker 1ppd  Substance and Sexual Activity  . Alcohol use: Yes    Comment: 1-2 glasses of Wine per day  . Drug use: No  . Sexual activity: Not Currently  Lifestyle  . Physical activity    Days per week: Not on file    Minutes per session: Not on file  . Stress: Not on file  Relationships  . Social Herbalist on phone: Not on file    Gets together: Not on file    Attends religious service: Not on file    Active member of club or organization: Not on file    Attends meetings of clubs or organizations: Not on file    Relationship status: Not on file  . Intimate partner violence    Fear of current or ex partner: Not on file    Emotionally abused: Not on file    Physically abused: Not on file    Forced sexual activity: Not on file  Other Topics Concern  . Not on file  Social History Narrative  . Not on file     Review of Systems: General: negative for chills, fever, night sweats or weight changes.  Cardiovascular: negative for chest pain, dyspnea on exertion, edema, orthopnea, palpitations, paroxysmal nocturnal dyspnea or shortness of breath Dermatological: negative for rash Respiratory: negative for cough or wheezing Urologic: negative for hematuria Abdominal: negative for nausea, vomiting, diarrhea, bright red blood per rectum, melena, or hematemesis Neurologic: negative for visual changes, syncope, or dizziness All other systems reviewed and are otherwise negative except as noted above.    Blood pressure 126/70, pulse 75, height 5\' 1"  (1.549 m), weight 161 lb 6.4 oz (73.2 kg), SpO2 98 %.  General appearance: alert and no distress Neck: no adenopathy, no carotid bruit, no  JVD, supple, symmetrical, trachea midline and thyroid not enlarged, symmetric, no tenderness/mass/nodules Lungs: clear to auscultation bilaterally Heart: regular rate and rhythm, S1, S2 normal, no murmur, click, rub or gallop Extremities: extremities normal, atraumatic, no cyanosis or edema Pulses: Diminished pedal pulses bilaterally Skin: Purplish discoloration of the distal aspect of her left foot Neurologic: Alert and oriented X 3, normal strength and tone. Normal symmetric reflexes. Normal coordination and gait  EKG not performed today  ASSESSMENT AND PLAN:   Peripheral arterial disease ([place]) Ms. Petz was referred back to me by Dr. Radford Pax for purplish discoloration of her left foot.  I last saw her in  the office 06/25/2016.  She had Dopplers performed 06/01/2016 that showed moderate bilateral SFA disease although she really denies claudication.  She was in the hospital in June for ischemic heart disease status post stenting of her RCA by Dr. Excell Seltzer 06/20/2018.  She really denies claudication but does complain of some pain in the distal aspect of her left foot with diminished pedal pulses.  I am to get lower extremity arterial Doppler studies to further evaluate.      Runell Gess MD FACP,FACC,FAHA, [facility] 12/20/2018 4:50 PM

## 2018-12-20 NOTE — Assessment & Plan Note (Signed)
Ms. Sforza was referred back to me by Dr. Radford Pax for purplish discoloration of her left foot.  I last saw her in the office 06/25/2016.  She had Dopplers performed 06/01/2016 that showed moderate bilateral SFA disease although she really denies claudication.  She was in the hospital in June for ischemic heart disease status post stenting of her RCA by Dr. Burt Knack 06/20/2018.  She really denies claudication but does complain of some pain in the distal aspect of her left foot with diminished pedal pulses.  I am to get lower extremity arterial Doppler studies to further evaluate.

## 2018-12-26 DIAGNOSIS — R55 Syncope and collapse: Secondary | ICD-10-CM | POA: Insufficient documentation

## 2018-12-26 HISTORY — DX: Syncope and collapse: R55

## 2018-12-29 ENCOUNTER — Ambulatory Visit (HOSPITAL_COMMUNITY)
Admission: RE | Admit: 2018-12-29 | Discharge: 2018-12-29 | Disposition: A | Discharge status: Home / Self Care | Payer: Medicare Other | Source: Ambulatory Visit | Attending: Cardiovascular Disease | Admitting: Cardiovascular Disease

## 2018-12-29 ENCOUNTER — Other Ambulatory Visit: Payer: Self-pay

## 2018-12-29 DIAGNOSIS — I739 Peripheral vascular disease, unspecified: Secondary | ICD-10-CM | POA: Diagnosis not present

## 2019-01-04 ENCOUNTER — Other Ambulatory Visit: Payer: Self-pay

## 2019-01-04 ENCOUNTER — Encounter: Payer: Self-pay | Admitting: Cardiovascular Disease

## 2019-01-04 ENCOUNTER — Ambulatory Visit: Payer: Medicare Other | Admitting: Cardiovascular Disease

## 2019-01-04 VITALS — BP 169/84 | HR 89 | Ht 61.0 in | Wt 162.2 lb

## 2019-01-04 DIAGNOSIS — I739 Peripheral vascular disease, unspecified: Secondary | ICD-10-CM

## 2019-01-04 NOTE — Patient Instructions (Signed)
Medication Instructions:  Your physician recommends that you continue on your current medications as directed. Please refer to the Current Medication list given to you today.  If you need a refill on your cardiac medications before your next appointment, please call your pharmacy.   Lab work: NONE  Testing/Procedures: NONE  Follow-Up: At [facility], you and your health needs are our priority.  As part of our continuing mission to provide you with exceptional heart care, we have created designated Provider Care Teams.  These Care Teams include your primary Cardiologist (physician) and Advanced Practice Providers (APPs -  Physician Assistants and Nurse Practitioners) who all work together to provide you with the care you need, when you need it. You may see Dr Berry or one of the following Advanced Practice Providers on your designated Care Team:    Luke Kilroy, PA-C  Callie Goodrich, PA-C  Jesse Cleaver, FNP  Your physician wants you to follow-up in: 6 months       

## 2019-01-04 NOTE — Progress Notes (Signed)
Sarah Francis returns today for follow-up of her Doppler studies performed 12/30/2018.  This revealed a right ABI of 0.91 and a left ABI of 0.68 with what appears to be a high-grade lesion in the mid left SFA with monophasic waveforms below that.  Her left foot seems to improved in color.  She denies claudication.  She does complains of foot pain but says it similar to her plantar past she had his pain she is had in the past.  At this point I do not feel compelled to proceed with an invasive evaluation.  I will see her back in 6 months for follow-up.  Lorretta Harp, M.D., [place], [facility], [place] [address] [place], [place]  [id number]  [phone] 01/04/2019 2:41 PM

## 2019-01-12 ENCOUNTER — Other Ambulatory Visit: Payer: Self-pay | Admitting: Cardiology

## 2019-01-29 ENCOUNTER — Encounter (HOSPITAL_COMMUNITY): Payer: Medicare Other

## 2019-01-29 ENCOUNTER — Encounter: Payer: Medicare Other | Admitting: Surgery

## 2019-02-05 NOTE — Progress Notes (Deleted)
Cardiology Office Note:    Date:  02/06/2019   ID:  Sarah Francis, DOB [birth year], MRN [id number]  PCP:  Cari Caraway, MD  Cardiologist:  Fransico Him, MD    Referring MD: Cari Caraway, MD   No chief complaint on file.   History of Present Illness:    Sarah Francis is a 74 y.o. female with a hx of HTN, PAD,tobacco abuse, strong familyof CAD. Patient had coronary CT in 2018 with elevated calcium score over thousand 98 percentile and three-vessel coronary calcification. Nuclear stress test showed no ischemia. She was treated medically at that time.   Patient presented to the hospital 06/20/2018 with NSTEMI treated with overlapping stent in the proximal and mid RCA with residual mild to moderate nonobstructive disease 40% left main, 60% distal LAD 50% proximal circumflex to be treated medically withAspirin and Brilinta for 1 year and atorvastatin increased to 80 mg daily. 2D echo showed normal LVEF 65% normal wall motion abnormality.  She was seen back in the office 06/2018 complaining of inability to take a deep breath in but had not been using caffeine prior to taking her Brilinta so this was encouraged.   She then was seen in the ER 11/21/2018 after a syncopal episode.  This was felt to be vasovagal in etiology.  An event monitor was ordered as well as carotid dopplers and echo.  Echo showed normal LVF with EF 65-70% with G2DD with mild MR.  Carotid dopplers showed 40-59% bilateral stenosis.  Event monitor showed normal rhythm with 1 run for 7 beats of WCT. She also has a hx of PAD with abnormal ABI on the left with high grade stenosis in the mid left SFA with recent duskiness of the toe but improved on PE with Dr. Gwenlyn Found in Dec 2020. Dr. Gwenlyn Found is following.   She is here today for followup and is doing well.  She denies any chest pain or pressure, SOB, DOE, PND, orthopnea, LE edema, dizziness, palpitations or syncope. She is compliant with her meds and is tolerating meds with no SE.      Past Medical History:  Diagnosis Date  . Anxiety   . Aortic atherosclerosis ([place])   . CAD (coronary artery disease), native coronary artery    06/20/18 PCI/DES overlapping stents to p/m RCA, mild disease in LAD, and Lcx  . Carotid artery stenosis    40-59% by dopplers 12/2018  . Coronary artery calcification seen on CAT scan 05/17/2016  . Diabetes mellitus without complication ([place])   . GERD (gastroesophageal reflux disease)   . HLD (hyperlipidemia)   . Hypertension   . Major depression    initial diagnosis/medication around age 14  . Osteoporosis    w h/o sacral fracture  . PAD (peripheral artery disease) (H[facility])    high grade stenosis in mid left SFA - followed by Dr. Gwenlyn Found  . Syncope 12/2018   felt to be vasovagal    Past Surgical History:  Procedure Laterality Date  . ABDOMINAL HYSTERECTOMY    . CORONARY STENT INTERVENTION N/A 06/20/2018   Procedure: CORONARY STENT INTERVENTION;  Surgeon: Sherren Mocha, MD;  Location: [place] [facility];  Service: Cardiovascular;  Laterality: N/A;  . KNEE SURGERY Right 2012  . LEFT HEART CATH AND CORONARY ANGIOGRAPHY N/A 06/20/2018   Procedure: LEFT HEART CATH AND CORONARY ANGIOGRAPHY;  Surgeon: Sherren Mocha, MD;  Location: [place] [facility];  Service: Cardiovascular;  Laterality: N/A;    Current Medications: No outpatient medications have been marked as  taking for the 02/06/19 encounter (Appointment) with Quintella Reichert, MD.     Allergies:   Penicillins   Social History   Socioeconomic History  . Marital status: Single    Spouse name: Not on file  . Number of children: Not on file  . Years of education: Not on file  . Highest education level: Not on file  Occupational History  . Not on file  Tobacco Use  . Smoking status: Current Every Day Smoker    Packs/day: 1.00    Years: 88.00    Pack years: 88.00  . Smokeless tobacco: Never Used  . Tobacco comment: Currently smoker 1ppd  Substance and Sexual Activity  . Alcohol  use: Yes    Comment: 1-2 glasses of Wine per day  . Drug use: No  . Sexual activity: Not Currently  Other Topics Concern  . Not on file  Social History Narrative  . Not on file   Social Determinants of Health   Financial Resource Strain:   . Difficulty of Paying Living Expenses: Not on file  Food Insecurity:   . Worried About Programme researcher, broadcasting/film/video in the Last Year: Not on file  . Ran Out of Food in the Last Year: Not on file  Transportation Needs:   . Lack of Transportation (Medical): Not on file  . Lack of Transportation (Non-Medical): Not on file  Physical Activity:   . Days of Exercise per Week: Not on file  . Minutes of Exercise per Session: Not on file  Stress:   . Feeling of Stress : Not on file  Social Connections:   . Frequency of Communication with Friends and Family: Not on file  . Frequency of Social Gatherings with Friends and Family: Not on file  . Attends Religious Services: Not on file  . Active Member of Clubs or Organizations: Not on file  . Attends Banker Meetings: Not on file  . Marital Status: Not on file     Family History: The patient's family history includes AAA (abdominal aortic aneurysm) in her father; Cancer in her brother; Diabetes in her brother; Heart attack in her mother; Heart disease in her mother; Hypertension in her sister.  ROS:   Please see the history of present illness.    ROS  All other systems reviewed and negative.   EKGs/Labs/Other Studies Reviewed:    The following studies were reviewed today: 2D echo, carotid dopplers, event monitor  EKG:  EKG is not ordered today.   Recent Labs: 06/16/2018: TSH 1.288 12/07/2018: ALT 18; BUN 10; Creatinine, Ser 0.97; Potassium 4.6; Sodium 134 12/08/2018: Hemoglobin 13.9; Platelets 265   Recent Lipid Panel    Component Value Date/Time   CHOL 137 06/17/2018 0357   TRIG 211 (H) 06/17/2018 0357   HDL 35 (L) 06/17/2018 0357   CHOLHDL 3.9 06/17/2018 0357   VLDL 42 (H)  06/17/2018 0357   LDLCALC 60 06/17/2018 0357    Physical Exam:    VS:  There were no vitals taken for this visit.    Wt Readings from Last 3 Encounters:  01/04/19 162 lb 3.2 oz (73.6 kg)  12/20/18 161 lb 6.4 oz (73.2 kg)  12/07/18 158 lb (71.7 kg)     GEN:  Well nourished, well developed in no acute distress HEENT: Normal NECK: No JVD; No carotid bruits LYMPHATICS: No lymphadenopathy CARDIAC: RRR, no murmurs, rubs, gallops RESPIRATORY:  Clear to auscultation without rales, wheezing or rhonchi  ABDOMEN: Soft, non-tender, non-distended MUSCULOSKELETAL:  No edema; No deformity  SKIN: Warm and dry NEUROLOGIC:  Alert and oriented x 3 PSYCHIATRIC:  Normal affect   ASSESSMENT:    1. Bilateral carotid artery stenosis   2. Vasovagal syncope   3. PAD (peripheral artery disease) (HCC)   4. Pure hypercholesterolemia    PLAN:    In order of problems listed above:  1.  ASCAD -s/p NSTEMI 05/2018 treated with overlapping stent in the proximal and mid RCA with residual mild to moderate nonobstructive disease 40% left main, 60% distal LAD 50% proximal circumflex to be treated medically. -denies any angina -continue ASA 81mg  daily, Brilinta 90mg  BID, high dose statin and BB. -stop Brilinta and change to Plavix 75mg  daily in June 2020  2.  HLD -LDL boal < 70 -LDL was 59 in Nov 2020 -continue atorvastatin 80mg  daily  3.  HTN -BP controlled -continue Lopressor 50mg  BID and Lisinopril 20mg  daily -Creatinine was normal at 0.97 in Nov 2020  4.  Tobacco abuse -ongoing tobacco use  5.  PAD -LE dopplers c/w  high grade stenosis in the mid left SFA with recent duskiness of the toe but improved on PE with Dr. July 2020 in Dec 2020 -followed by Dr.  6. Syncope -felt to be vasovagal -carotid dopplers with bilater 40-59% stenosis -2D echo with normal LVF and no significant valvular heart disease -event monitor showed 1 run of NSVT for 7 beats -continue BB  7.  NSVT -7 beat run on  event monitor -denies any palpitations -continue BB -K+ 4.6 in Nov 2020 -repeat BMET with Mag  8.  Carotid artery stenosis -moderate 40-59% bilateral by dopplers 12/2018 -repeat dopplers in 1 year -continue ASA and statin   Medication Adjustments/Labs and Tests Ordered: Current medicines are reviewed at length with the patient today.  Concerns regarding medicines are outlined above.  No orders of the defined types were placed in this encounter.  No orders of the defined types were placed in this encounter.   Signed, Dec 2020, MD  02/06/2019 9:24 AM    [facility]

## 2019-02-06 ENCOUNTER — Ambulatory Visit: Payer: Medicare Other | Admitting: Cardiology

## 2019-02-06 ENCOUNTER — Telehealth: Payer: Self-pay | Admitting: *Deleted

## 2019-02-06 ENCOUNTER — Ambulatory Visit: Payer: Medicare PPO | Admitting: Nurse Practitioner

## 2019-02-06 ENCOUNTER — Encounter: Payer: Self-pay | Admitting: Nurse Practitioner

## 2019-02-06 ENCOUNTER — Encounter: Payer: Self-pay | Admitting: Cardiology

## 2019-02-06 ENCOUNTER — Other Ambulatory Visit: Payer: Self-pay

## 2019-02-06 VITALS — BP 138/70 | HR 108 | Ht 61.0 in | Wt 163.0 lb

## 2019-02-06 DIAGNOSIS — R079 Chest pain, unspecified: Secondary | ICD-10-CM

## 2019-02-06 DIAGNOSIS — I2510 Atherosclerotic heart disease of native coronary artery without angina pectoris: Secondary | ICD-10-CM

## 2019-02-06 DIAGNOSIS — I10 Essential (primary) hypertension: Secondary | ICD-10-CM

## 2019-02-06 DIAGNOSIS — F17200 Nicotine dependence, unspecified, uncomplicated: Secondary | ICD-10-CM | POA: Diagnosis not present

## 2019-02-06 DIAGNOSIS — E785 Hyperlipidemia, unspecified: Secondary | ICD-10-CM | POA: Insufficient documentation

## 2019-02-06 DIAGNOSIS — I739 Peripheral vascular disease, unspecified: Secondary | ICD-10-CM | POA: Insufficient documentation

## 2019-02-06 DIAGNOSIS — I6529 Occlusion and stenosis of unspecified carotid artery: Secondary | ICD-10-CM | POA: Insufficient documentation

## 2019-02-06 DIAGNOSIS — R000 Tachycardia, unspecified: Secondary | ICD-10-CM

## 2019-02-06 DIAGNOSIS — Z7189 Other specified counseling: Secondary | ICD-10-CM

## 2019-02-06 NOTE — Progress Notes (Addendum)
CARDIOLOGY OFFICE NOTE  Date:  02/06/2019    [name] Date of Birth: 01-27-1945 Medical Record #[id number]  PCP:  Gweneth Dimitri, MD  Cardiologist:  Mayford Knife   Chief Complaint  Patient presents with  . Follow-up    History of Present Illness: Sarah Francis is a 74 y.o. female who presents today for a follow up visit. Seen for Dr. Mayford Knife.   She has a history of HTN, PAD,tobacco abuse, and strong familyof CAD. Patient had coronary CT in 2018 with elevated calcium score over thousand 98 percentile and three-vessel coronary calcification. Nuclear stress test showed no ischemia. She was treated medically at that time.   She presented to the hospital 06/20/2018 with NSTEMI treated with overlapping stent in the proximal and mid RCA with residual mild to moderate nonobstructive disease 40% left main, 60% distal LAD 50% proximal circumflex to be treated medically withAspirin and Brilinta for 1 year and atorvastatin increased to 80 mg daily. 2D echo showed normal LVEF 65% normal wall motion abnormality.  She was seen back in the office 06/2018 complaining of inability to take a deep breath in but had not been using caffeine prior to taking her Brilinta so this was encouraged.   Last seen by telehealth visit with Dr. Mayford Knife back in August - felt to be doing ok. Called in November with swelling and other issues - had a monitor and echo updated. Has also seen Dr. Allyson Sabal for her PAD in the interim.   The patient does not have symptoms concerning for COVID-19 infection (fever, chills, cough, or new shortness of breath).   Comes in today. Here alone. She was here for follow up. Late for her visit. She has had some chest pain - had yesterday and last Saturday and a few times prior. All at rest. Tends to be in her left arm. She will use NTG with relief - not prompt but after about 5 to 10 minutes. She continues to smoke. She is not able to exercise. Limited by her plantar fascitis.   Tells me  at the end of the visit that she has missed several doses of her nighttime doses of Metoprolol and Brilinta. She says she puts it off and then forgets and goes off to sleep.   Past Medical History:  Diagnosis Date  . Anxiety   . Aortic atherosclerosis (HCC)   . CAD (coronary artery disease), native coronary artery    06/20/18 PCI/DES overlapping stents to p/m RCA, mild disease in LAD, and Lcx  . Carotid artery stenosis    40-59% by dopplers 12/2018  . Coronary artery calcification seen on CAT scan 05/17/2016  . Diabetes mellitus without complication (HCC)   . GERD (gastroesophageal reflux disease)   . HLD (hyperlipidemia)   . Hypertension   . Major depression    initial diagnosis/medication around age 25  . Osteoporosis    w h/o sacral fracture  . PAD (peripheral artery disease) (HCC)    high grade stenosis in mid left SFA - followed by Dr. Allyson Sabal  . Syncope 12/2018   felt to be vasovagal    Past Surgical History:  Procedure Laterality Date  . ABDOMINAL HYSTERECTOMY    . CORONARY STENT INTERVENTION N/A 06/20/2018   Procedure: CORONARY STENT INTERVENTION;  Surgeon: Tonny Bollman, MD;  Location: [facility] INVASIVE CV LAB;  Service: Cardiovascular;  Laterality: N/A;  . KNEE SURGERY Right 2012  . LEFT HEART CATH AND CORONARY ANGIOGRAPHY N/A 06/20/2018   Procedure: LEFT HEART CATH  AND CORONARY ANGIOGRAPHY;  Surgeon: Sherren Mocha, MD;  Location: [place] CV LAB;  Service: Cardiovascular;  Laterality: N/A;     Medications: Current Meds  Medication Sig  . aspirin EC 81 MG tablet Take 81 mg by mouth daily.  Marland Kitchen atorvastatin (LIPITOR) 80 MG tablet TAKE 1 TABLET BY MOUTH EVERY DAY AT 6PM  . BRILINTA 90 MG TABS tablet TAKE 1 TABLET BY MOUTH TWICE A DAY  . cetirizine (ZYRTEC) 10 MG tablet Take 10 mg by mouth every morning.  . cholecalciferol (VITAMIN D3) 25 MCG (1000 UT) tablet Take 2,000 Units by mouth daily.  . cycloSPORINE (RESTASIS) 0.05 % ophthalmic emulsion Place 1 drop into both eyes  2 (two) times daily.  . DULoxetine (CYMBALTA) 60 MG capsule Take 60 mg by mouth daily.  . Insulin Glargine (LANTUS SOLOSTAR) 100 UNIT/ML Solostar Pen Inject 15-35 Units into the skin 2 (two) times daily. 15 units in the morning and 35 units in the evening  . Insulin Lispro (HUMALOG KWIKPEN [place]) Inject 8-12 Units into the skin 3 (three) times daily. 8 units with breakfast, 10 units with lunch and 12 units with dinner  . lisinopril (ZESTRIL) 20 MG tablet Take 1 tablet (20 mg total) by mouth daily.  . metoprolol tartrate (LOPRESSOR) 50 MG tablet Take 1 tablet (50 mg total) by mouth 2 (two) times daily.  . nicotine (NICODERM CQ) 21 mg/24hr patch Place 1 patch (21 mg total) onto the skin daily.  . nitroGLYCERIN (NITROSTAT) 0.4 MG SL tablet Place 1 tablet (0.4 mg total) under the tongue every 5 (five) minutes x 3 doses as needed for chest pain.  Marland Kitchen omeprazole (PRILOSEC) 20 MG capsule Take 20 mg by mouth daily.  Marland Kitchen thiamine (VITAMIN B-1) 100 MG tablet Take 100 mg by mouth daily.     Allergies: Allergies  Allergen Reactions  . Penicillins Rash    Did it involve swelling of the face/tongue/throat, SOB, or low BP? Yes Did it involve sudden or severe rash/hives, skin peeling, or any reaction on the inside of your mouth or nose? Unknown Did you need to seek medical attention at a hospital or doctor's office? Unknown When did it last happen?Pt was a child, had lip swelling If all above answers are "NO", may proceed with cephalosporin use.     Social History: The patient  reports that she has been smoking. She has a 88.00 pack-year smoking history. She has never used smokeless tobacco. She reports current alcohol use. She reports that she does not use drugs.   Family History: The patient's family history includes AAA (abdominal aortic aneurysm) in her father; Cancer in her brother; Diabetes in her brother; Heart attack in her mother; Heart disease in her mother; Hypertension in her sister.    Review of Systems: Please see the history of present illness.   All other systems are reviewed and negative.   Physical Exam: VS:  BP 138/70   Pulse (!) 108   Ht 5\' 1"  (1.549 m)   Wt 163 lb (73.9 kg)   SpO2 96%   BMI 30.80 kg/m  .  BMI Body mass index is 30.8 kg/m.  Wt Readings from Last 3 Encounters:  02/06/19 163 lb (73.9 kg)  01/04/19 162 lb 3.2 oz (73.6 kg)  12/20/18 161 lb 6.4 oz (73.2 kg)    General: Pleasant. Alert and in no acute distress.  Short in stature.  HEENT: Normal.  Neck: Supple, no JVD, carotid bruits, or masses noted.  Cardiac: Regular rate  and rhythm. HR little fast. Outflow murmur noted.   No edema.  Respiratory:  Lungs are coarse but with normal work of breathing.  GI: Soft and nontender.  MS: No deformity or atrophy. Gait and ROM intact.  Skin: Warm and dry. Color is normal.  Neuro:  Strength and sensation are intact and no gross focal deficits noted.  Psych: Alert, appropriate and with normal affect.   LABORATORY DATA:  EKG:  EKG is ordered today. This demonstrates sinus tach with PACs.  Lab Results  Component Value Date   WBC 12.9 (H) 12/08/2018   HGB 13.9 12/08/2018   HCT 41.0 12/08/2018   PLT 265 12/08/2018   GLUCOSE 265 (H) 12/07/2018   CHOL 137 06/17/2018   TRIG 211 (H) 06/17/2018   HDL 35 (L) 06/17/2018   LDLCALC 60 06/17/2018   ALT 18 12/07/2018   AST 16 12/07/2018   NA 134 (L) 12/07/2018   K 4.6 12/07/2018   CL 97 (L) 12/07/2018   CREATININE 0.97 12/07/2018   BUN 10 12/07/2018   CO2 26 12/07/2018   TSH 1.288 06/16/2018   INR 1.0 12/08/2018   HGBA1C 8.3 (H) 06/16/2018     BNP (last 3 results) No results for input(s): BNP in the last 8760 hours.  ProBNP (last 3 results) No results for input(s): PROBNP in the last 8760 hours.   Other Studies Reviewed Today:    Monitor Study Highlights 12/2018   Normal sinus rhythm and sinus tachycardia. The average heart rate was 89bpm and ranged from 63-152bpm.  1 run of  nonsustained ventricular tachycardia for 7 beats.     Quintella Reichert, MD  01/18/2019 5:26 PM EST    Heart monitor showed normal rhythm with average heart rate 89bpm. She did have 1 run of abnormal rhythm from bottom of heart for 7 beats. Please find out if she has had any CP. Her LVF is normal. I doubt that this would have caused her syncope. Find out if she has had any chest pain or further dizziness/syncope     ECHO IMPRESSIONS 11/2018   1. Left ventricular ejection fraction, by visual estimation, is 65 to 70%. The left ventricle has normal function. There is moderately increased left ventricular hypertrophy.  2. Left ventricular diastolic parameters are consistent with Grade II diastolic dysfunction (pseudonormalization).  3. Global right ventricle has normal systolic function.The right ventricular size is normal. No increase in right ventricular wall thickness.  4. Left atrial size was mildly dilated.  5. Right atrial size was normal.  6. The mitral valve is normal in structure. Mild mitral valve regurgitation. No evidence of mitral stenosis.  7. The tricuspid valve is normal in structure. Tricuspid valve regurgitation is not demonstrated.  8. The aortic valve is normal in structure. Aortic valve regurgitation is not visualized. No evidence of aortic valve sclerosis or stenosis.  9. The pulmonic valve was normal in structure. Pulmonic valve regurgitation is not visualized. 10. Normal pulmonary artery systolic pressure. 11. The inferior vena cava is normal in size with greater than 50% respiratory variability, suggesting right atrial pressure of 3 mmHg.   CORONARY STENT INTERVENTION 05/2018  LEFT HEART CATH AND CORONARY ANGIOGRAPHY  Conclusion  1.  Mild to moderate nonobstructive stenosis in the left main, LAD, and left circumflex distributions 2.  Severe stenosis of the ostium, proximal portion, and midportion of the RCA, treated successfully with complex PCI using  overlapping drug-eluting stents in the mid vessel and overlapping drug-eluting stents in the proximal/ostial  vessel 3.  Normal LV systolic function by echo with normal LVEDP  Recommendations: Aggressive medical therapy, dual antiplatelet therapy without interruption using aspirin and ticagrelor for a minimum of 12 months.     ASSESSMENT & PLAN:    1.  CAD with prior NSTEMI - treated with overlapping stent in the proximal and mid RCA with residual mild to moderate non obstructive disease with 40% left main, 60% distal LAD 50% proximal circumflex to be treated medically.  She has not been taking her medicines as recommended. She does not wish to increase her medicines today - she wants to try and take current regimen as outlined - she is to set an alarm, take earlier, etc. Will see back in one week - if she fails to improve would increase beta blocker and/or add Imdur.   2. HLD - on statin - lab per PCP  3. HTN - BP is good.    4. Tobacco abuse - back smoking 1 pack a day - she is not ready to stop. She understands that this does not help her overall situation.   5. PAD - has seen Dr. Allyson Sabal last month following ABIs -  This revealed a right ABI of 0.91 and a left ABI of 0.68 with what appeared to be a high-grade lesion in the mid left SFA with monophasic waveforms below that. He has elected to follow. She needs smoking cessation.   6. COVID-19 Education: The signs and symptoms of COVID-19 were discussed with the patient and how to seek care for testing (follow up with PCP or arrange E-visit).  The importance of social distancing, staying at home, hand hygiene and wearing a mask when out in public were discussed today.  Current medicines are reviewed with the patient today.  The patient does not have concerns regarding medicines other than what has been noted above.  The following changes have been made:  See above.  Labs/ tests ordered today include:   No orders of the defined types were  placed in this encounter.    Disposition:   FU with Dr. Mayford Knife in one week with EKG.   Patient is agreeable to this plan and will call if any problems develop in the interim.   SignedNorma Fredrickson, NP  02/06/2019 3:13 PM  [facility] [address] [place], [place]  [address] Phone: [phone] Fax: [phone]

## 2019-02-06 NOTE — Patient Instructions (Addendum)
After Visit Summary:  We will be checking the following labs today - NONE   Medication Instructions:    Continue with your current medicines.   Set an alarm if you needed.    If you need a refill on your cardiac medications before your next appointment, please call your pharmacy.     Testing/Procedures To Be Arranged:  N/A  Follow-Up:   See Dr. Mayford Knife in one week for follow up with an EKG    At [facility], you and your health needs are our priority.  As part of our continuing mission to provide you with exceptional heart care, we have created designated Provider Care Teams.  These Care Teams include your primary Cardiologist (physician) and Advanced Practice Providers (APPs -  Physician Assistants and Nurse Practitioners) who all work together to provide you with the care you need, when you need it.  Special Instructions:  . Stay safe, stay home, wash your hands for at least 20 seconds and wear a mask when out in public.  . It was good to talk with you today.    Call the [facility] office at [phone] if you have any questions, problems or concerns.

## 2019-02-06 NOTE — Telephone Encounter (Signed)
Pt was scheduled to see Dr. Mayford Knife today at 1 pm.  Pt was late, and checked in at 1:25 pm.  Pt reports she stood in line downstairs to pre-register for an extended time, which made her late for her appt. Informed Dr. Mayford Knife of this, and she advised that the pt reschedule to a later date.  Informed Dr. Mayford Knife that the pt reports she has been having intermittent chest pain and took one nitroglycerin late last night for this pain.  Per Dr. Mayford Knife, the pt can reschedule or report to the ER. Per protocol, pt is to be offered the first available appt with an APP or Dr. Mayford Knife, or refer to the ER.   Pt was scheduled first available appt today, with Norma Fredrickson NP at 2:30 pm.  Pt is in no obvious distress, which did not warrant calling 911 or referring her immediately to the ER.  Pt taken off of Dr. Norris Cross schedule for today and added to Kary Kos NP schedule for today at 2:30 pm.  Informed the pt that Lawson Fiscal may be behind due to unexpected add in.  Pt states she is quite ok waiting any amount of time, and is very gracious for all the assistance provided in working her into another Providers schedule today.  Pt is comfortably waiting in the lobby for her appt, reading a book, and in no obvious distress. Reiterated to the pt that in future appts in the office, she should arrive 15 mins prior to her scheduled OV. Pt verbalized understanding and agreed to this plan.   Norma Fredrickson NP is aware of added appt and agreed to see the pt.

## 2019-02-08 NOTE — Addendum Note (Signed)
Addended by: Vernard Gambles on: 02/08/2019 08:38 AM   Modules accepted: Orders

## 2019-02-12 NOTE — Progress Notes (Signed)
Cardiology Office Note:    Date:  02/13/2019   ID:  Sarah Francis, DOB 12-06-1945, MRN [id number]  PCP:  Sarah Dimitri, MD  Cardiologist:  Sarah Magic, MD    Referring MD: Sarah Dimitri, MD   Chief Complaint  Patient presents with  . Hypertension  . Coronary Artery Disease  . Hyperlipidemia    History of Present Illness:    Sarah Francis is a 74 y.o. female with a hx of HTN, PAD,tobacco abuse, and strong familyof CAD. Patient had coronary CT in 2018 with elevated calcium score and three-vessel coronary calcification. Nuclear stress test showed no ischemia.She was treated medically at that time.   She presented to the hospital 06/20/2018 with NSTEMI treated with overlapping stent in the proximal and mid RCA with residual mild to moderate nonobstructive disease 40% left main, 60% distal LAD 50% proximal circumflex to be treated medically withAspirin and Brilinta for 1 year and atorvastatin increased to 80 mg daily. 2D echo showed normal LVEF 65% normal wall motion abnormality.She was seen back in the office 06/2018 complaining of inability to take a deep breath in but had not been using caffeine prior to taking her Brilinta so this was encouraged.   Called in November with swelling and other issues - had a monitor and echo updated. Has also seen Dr. Allyson Francis for her PAD in the interim.  She was seen by Sarah Fredrickson, NP a week ago for nonexertional CP all at rest with radiation in her left arm and resolved with NTG SL.  She continues to smoke and unable to exercise due to plantar fascitis.  She apparently missed several doses of her PM BB and Brilinta.  It was felt that medical noncompliance could have prompted her CP.  She was restarted on her BB and recommendations for nitrates if CP continued.    She is here today for followup and is doing well.  She has been better about taking her meds after setting an alarm.  SHe denies any chest pain or pressure, SOB, DOE, PND, orthopnea, LE  edema, dizziness, palpitations or syncope. He is compliant with his meds and is tolerating meds with no SE.     Past Medical History:  Diagnosis Date  . Anxiety   . Aortic atherosclerosis (HCC)   . CAD (coronary artery disease), native coronary artery    06/20/18 PCI/DES overlapping stents to p/m RCA, mild disease in LAD, and Lcx  . Carotid artery stenosis    40-59% by dopplers 12/2018  . Diabetes mellitus without complication (HCC)   . GERD (gastroesophageal reflux disease)   . HLD (hyperlipidemia)   . Hypertension   . Major depression    initial diagnosis/medication around age 22  . Osteoporosis    w h/o sacral fracture  . PAD (peripheral artery disease) (HCC)    high grade stenosis in mid left SFA - followed by Dr. Allyson Francis  . Syncope 12/2018   felt to be vasovagal    Past Surgical History:  Procedure Laterality Date  . ABDOMINAL HYSTERECTOMY    . CORONARY STENT INTERVENTION N/A 06/20/2018   Procedure: CORONARY STENT INTERVENTION;  Surgeon: Sarah Bollman, MD;  Location: [facility] INVASIVE CV LAB;  Service: Cardiovascular;  Laterality: N/A;  . KNEE SURGERY Right 2012  . LEFT HEART CATH AND CORONARY ANGIOGRAPHY N/A 06/20/2018   Procedure: LEFT HEART CATH AND CORONARY ANGIOGRAPHY;  Surgeon: Sarah Bollman, MD;  Location: [facility] INVASIVE CV LAB;  Service: Cardiovascular;  Laterality: N/A;    Current Medications: Current  Meds  Medication Sig  . aspirin EC 81 MG tablet Take 81 mg by mouth daily.  Marland Kitchen atorvastatin (LIPITOR) 80 MG tablet TAKE 1 TABLET BY MOUTH EVERY DAY AT 6PM  . BRILINTA 90 MG TABS tablet TAKE 1 TABLET BY MOUTH TWICE A DAY  . cetirizine (ZYRTEC) 10 MG tablet Take 10 mg by mouth every morning.  . cholecalciferol (VITAMIN D3) 25 MCG (1000 UT) tablet Take 2,000 Units by mouth daily.  . cycloSPORINE (RESTASIS) 0.05 % ophthalmic emulsion Place 1 drop into both eyes 2 (two) times daily.  . DULoxetine (CYMBALTA) 60 MG capsule Take 60 mg by mouth daily.  . Insulin Glargine (LANTUS  SOLOSTAR) 100 UNIT/ML Solostar Pen Inject 15-35 Units into the skin 2 (two) times daily. 15 units in the morning and 35 units in the evening  . Insulin Lispro (HUMALOG KWIKPEN [place]) Inject 8-12 Units into the skin 3 (three) times daily. 8 units with breakfast, 10 units with lunch and 12 units with dinner  . lisinopril (ZESTRIL) 20 MG tablet Take 1 tablet (20 mg total) by mouth daily.  . metoprolol tartrate (LOPRESSOR) 50 MG tablet Take 1 tablet (50 mg total) by mouth 2 (two) times daily.  . nicotine (NICODERM CQ) 21 mg/24hr patch Place 1 patch (21 mg total) onto the skin daily.  . nitroGLYCERIN (NITROSTAT) 0.4 MG SL tablet Place 1 tablet (0.4 mg total) under the tongue every 5 (five) minutes x 3 doses as needed for chest pain.  Marland Kitchen omeprazole (PRILOSEC) 20 MG capsule Take 20 mg by mouth daily.  Marland Kitchen thiamine (VITAMIN B-1) 100 MG tablet Take 100 mg by mouth daily.     Allergies:   Penicillins   Social History   Socioeconomic History  . Marital status: Single    Spouse name: Not on file  . Number of children: Not on file  . Years of education: Not on file  . Highest education level: Not on file  Occupational History  . Not on file  Tobacco Use  . Smoking status: Current Every Day Smoker    Packs/day: 1.00    Years: 88.00    Pack years: 88.00  . Smokeless tobacco: Never Used  . Tobacco comment: Currently smoker 1ppd  Substance and Sexual Activity  . Alcohol use: Yes    Comment: 1-2 glasses of Wine per day  . Drug use: No  . Sexual activity: Not Currently  Other Topics Concern  . Not on file  Social History Narrative  . Not on file   Social Determinants of Health   Financial Resource Strain:   . Difficulty of Paying Living Expenses: Not on file  Food Insecurity:   . Worried About Charity fundraiser in the Last Year: Not on file  . Ran Out of Food in the Last Year: Not on file  Transportation Needs:   . Lack of Transportation (Medical): Not on file  . Lack of Transportation  (Non-Medical): Not on file  Physical Activity:   . Days of Exercise per Week: Not on file  . Minutes of Exercise per Session: Not on file  Stress:   . Feeling of Stress : Not on file  Social Connections:   . Frequency of Communication with Friends and Family: Not on file  . Frequency of Social Gatherings with Friends and Family: Not on file  . Attends Religious Services: Not on file  . Active Member of Clubs or Organizations: Not on file  . Attends Archivist Meetings: Not  on file  . Marital Status: Not on file     Family History: The patient's family history includes AAA (abdominal aortic aneurysm) in her father; Cancer in her brother; Diabetes in her brother; Heart attack in her mother; Heart disease in her mother; Hypertension in her sister.  ROS:   Please see the history of present illness.    ROS  All other systems reviewed and negative.   EKGs/Labs/Other Studies Reviewed:    The following studies were reviewed today: Cath, labs, EKG  EKG:  EKG is ordered today and showed NSR with T wave inversions in the anterolateral leads  Recent Labs: 06/16/2018: TSH 1.288 12/07/2018: ALT 18; BUN 10; Creatinine, Ser 0.97; Potassium 4.6; Sodium 134 12/08/2018: Hemoglobin 13.9; Platelets 265   Recent Lipid Panel    Component Value Date/Time   CHOL 137 06/17/2018 0357   TRIG 211 (H) 06/17/2018 0357   HDL 35 (L) 06/17/2018 0357   CHOLHDL 3.9 06/17/2018 0357   VLDL 42 (H) 06/17/2018 0357   LDLCALC 60 06/17/2018 0357    Physical Exam:    VS:  BP 132/84   Pulse 70   Ht 5\' 1"  (1.549 m)   Wt 161 lb 12.8 oz (73.4 kg)   SpO2 98%   BMI 30.57 kg/m     Wt Readings from Last 3 Encounters:  02/13/19 161 lb 12.8 oz (73.4 kg)  02/06/19 163 lb (73.9 kg)  01/04/19 162 lb 3.2 oz (73.6 kg)     GEN:  Well nourished, well developed in no acute distress HEENT: Normal NECK: No JVD; No carotid bruits LYMPHATICS: No lymphadenopathy CARDIAC: RRR, no murmurs, rubs,  gallops RESPIRATORY:  Clear to auscultation without rales, wheezing or rhonchi  ABDOMEN: Soft, non-tender, non-distended MUSCULOSKELETAL:  No edema; No deformity  SKIN: Warm and dry NEUROLOGIC:  Alert and oriented x 3 PSYCHIATRIC:  Normal affect   ASSESSMENT:    1. Coronary artery disease involving native coronary artery of native heart without angina pectoris   2. Mixed hyperlipidemia   3. Essential hypertension   4. Peripheral arterial disease (HCC)   5. Bilateral carotid artery stenosis   6. Tobacco abuse counseling   7. Syncope and collapse    PLAN:    In order of problems listed above:  1.  ASCAD -s/p  NSTEMI 05/2018 -cath with overlapping stent in the proximal and mid RCA with residual mild to moderate nonobstructive disease 40% left main, 60% distal LAD 50% proximal circumflex -she had recurrent CP but nonexertional and somewhat atypical but improved with SL NTG -noncompliant with BB and Brilinta but now back on Meds -no further CP since last OV -continue ASA 81mg  daily, Brilinta 90mg  BID, BB and statin  2.  HLD -LDL goal < 70 -LDL was 59 in Nov 2020 -continue atorvastatin 80mg  daily  3.  HTN -BP controlled -continue Lisinopril 20mg  daily and Lopressor 50mg  BID  4.  PAD -has seen Dr. last month following ABIs which revealed a right ABI of 0.91 and a left ABI of 0.68 with what appeared to be a high-grade lesion in the mid left SFA with monophasic waveforms below that.  -medical management recommended -needs to quit smoking   5.  Carotid artery stenosis -dopplers 11/2018 showed 40-59% stenosis bilaterally -repeat dopplers 11/2019 -continue ASA and statin  6.  Tobacco abuse counseling -she has tried Chantix and Wellbutrin but did not really help -she now has ecigarettes  -I encouraged her to wean off several cig a week until  she is over them  7.  Presyncope -she has been having episodes of dizziness when she goes from sitting to standing -she had 1  episode when she got out of bed to go urinate and after urinating she stood up and got dizzy but did not pass out -she did have 1 syncopal episode in Nov where she got up fast and got dizzy and passed out -orthostatic BPs today are normal.  Her BP was 150/48mmHg laying and then when she sat up she got dizzy but BP actually went up to 167/70mMhg.  Standing her Bp went down slightly to 139/80 but after standing 3 minutes was back up at 157/65mmHg. -I encouraged her that she needs to get up slowly especially at night and sit on the side of the bed or commode if she is going to the bathroom and get up slowly -2D echo normal LVF with no significant valvular heart dz in Nov 2020   Medication Adjustments/Labs and Tests Ordered: Current medicines are reviewed at length with the patient today.  Concerns regarding medicines are outlined above.  Orders Placed This Encounter  Procedures  . EKG 12-Lead   No orders of the defined types were placed in this encounter.   Signed, Sarah Magic, MD  02/13/2019 2:06 PM    [facility]

## 2019-02-13 ENCOUNTER — Encounter: Payer: Self-pay | Admitting: Cardiology

## 2019-02-13 ENCOUNTER — Other Ambulatory Visit: Payer: Self-pay

## 2019-02-13 ENCOUNTER — Ambulatory Visit: Payer: Medicare PPO | Admitting: Cardiology

## 2019-02-13 VITALS — BP 132/84 | HR 70 | Ht 61.0 in | Wt 161.8 lb

## 2019-02-13 DIAGNOSIS — I6523 Occlusion and stenosis of bilateral carotid arteries: Secondary | ICD-10-CM

## 2019-02-13 DIAGNOSIS — I739 Peripheral vascular disease, unspecified: Secondary | ICD-10-CM

## 2019-02-13 DIAGNOSIS — I2510 Atherosclerotic heart disease of native coronary artery without angina pectoris: Secondary | ICD-10-CM | POA: Diagnosis not present

## 2019-02-13 DIAGNOSIS — Z716 Tobacco abuse counseling: Secondary | ICD-10-CM

## 2019-02-13 DIAGNOSIS — E782 Mixed hyperlipidemia: Secondary | ICD-10-CM | POA: Diagnosis not present

## 2019-02-13 DIAGNOSIS — I10 Essential (primary) hypertension: Secondary | ICD-10-CM

## 2019-02-13 DIAGNOSIS — R55 Syncope and collapse: Secondary | ICD-10-CM

## 2019-02-13 NOTE — Addendum Note (Signed)
Addended by: Oretha Milch on: 02/13/2019 02:22 PM   Modules accepted: Orders

## 2019-02-13 NOTE — Patient Instructions (Signed)
Medication Instructions:  Your physician recommends that you continue on your current medications as directed. Please refer to the Current Medication list given to you today.  Labwork: None ordered.  Testing/Procedures: Your physician has requested that you have a carotid duplex. This test is an ultrasound of the carotid arteries in your neck. It looks at blood flow through these arteries that supply the brain with blood. Allow one hour for this exam. There are no restrictions or special instructions.  Carotid duplex to be completed in December 2021  Follow-Up: Your physician recommends that you schedule a follow-up appointment in:   March 2 @ 1:45pm with Ronie Spies, PA  6 months with Dr. Mayford Knife  Any Other Special Instructions Will Be Listed Below (If Applicable).     If you need a refill on your cardiac medications before your next appointment, please call your pharmacy.

## 2019-03-12 ENCOUNTER — Other Ambulatory Visit: Payer: Self-pay | Admitting: *Deleted

## 2019-03-12 DIAGNOSIS — F17210 Nicotine dependence, cigarettes, uncomplicated: Secondary | ICD-10-CM

## 2019-03-12 DIAGNOSIS — Z87891 Personal history of nicotine dependence: Secondary | ICD-10-CM

## 2019-03-23 ENCOUNTER — Ambulatory Visit (INDEPENDENT_AMBULATORY_CARE_PROVIDER_SITE_OTHER)
Admission: RE | Admit: 2019-03-23 | Discharge: 2019-03-23 | Disposition: A | Discharge status: Home / Self Care | Payer: Medicare PPO | Source: Ambulatory Visit | Attending: Acute Care | Admitting: Acute Care

## 2019-03-23 ENCOUNTER — Other Ambulatory Visit: Payer: Self-pay

## 2019-03-23 DIAGNOSIS — Z87891 Personal history of nicotine dependence: Secondary | ICD-10-CM

## 2019-03-23 DIAGNOSIS — F17210 Nicotine dependence, cigarettes, uncomplicated: Secondary | ICD-10-CM

## 2019-03-26 NOTE — Progress Notes (Deleted)
Cardiology Office Note  Date: 03/26/2019   ID: Sarah Francis, DOB 03-10-1945, MRN [id number]  PCP:  Sarah Caraway, MD  Cardiologist:  Sarah Him, MD Electrophysiologist:  None   No chief complaint on file.   History of Present Illness: Sarah Francis is a 74 y.o. female with a history of HTN, PAD, CAD, tobacco abuse.  CCTA 2018 with elevated CAC score and three-vessel CAC.  Negative nuclear stress.  Treated medically.  06/20/2018 NSTEMI: Stent placement to proximal / mid RCA; LM 40%, distal LAD 60%, proximal circumflex 50%.  Treated with ASA and Brilinta x1 year. Placed on high intensity atorvastatin 80 mg.  Echo EF 65%, no WMA's,  Phone call in November with lower extremity swelling.  Monitor and echo were ordered. Echo 11 /6 /2020 : EF 65 to 70%, moderately increased LVH, grade 2 diastolic dysfunction, mild MR  Cardiac monitor 12/13/2018 :Normal sinus rhythm and sinus tachycardia. The average heart rate was 89 bpm and ranged from 63-152bpm. 1 run of nonsustained ventricular tachycardia for 7 beats.  Seen 02/13/2019 for nonexertional CP at rest with radiation in her left arm resolved with NTG SL. Marland Kitchen She had missed several doses of her PM BB and Brilinta.  It was felt that medical noncompliance could have prompted her CP.  She was restarted on her BB with recommendations for nitrates if CP continued.        Patient has carotid artery duplex studies ordered.  Are you still smoking ?Stop smoking.  Past Medical History:  Diagnosis Date  . Anxiety   . Aortic atherosclerosis ([place])   . CAD (coronary artery disease), native coronary artery    06/20/18 PCI/DES overlapping stents to p/m RCA, mild disease in LAD, and Lcx  . Carotid artery stenosis    40-59% by dopplers 12/2018  . Diabetes mellitus without complication ([place])   . GERD (gastroesophageal reflux disease)   . HLD (hyperlipidemia)   . Hypertension   . Major depression    initial diagnosis/medication around age 74  .  Osteoporosis    w h/o sacral fracture  . PAD (peripheral artery disease) (HCC)    high grade stenosis in mid left SFA - followed by Dr. Gwenlyn Found  . Syncope 12/2018   felt to be vasovagal    Past Surgical History:  Procedure Laterality Date  . ABDOMINAL HYSTERECTOMY    . CORONARY STENT INTERVENTION N/A 06/20/2018   Procedure: CORONARY STENT INTERVENTION;  Surgeon: Sherren Mocha, MD;  Location: [place] CV LAB;  Service: Cardiovascular;  Laterality: N/A;  . KNEE SURGERY Right 2012  . LEFT HEART CATH AND CORONARY ANGIOGRAPHY N/A 06/20/2018   Procedure: LEFT HEART CATH AND CORONARY ANGIOGRAPHY;  Surgeon: Sherren Mocha, MD;  Location: [place] CV LAB;  Service: Cardiovascular;  Laterality: N/A;    Current Outpatient Medications  Medication Sig Dispense Refill  . aspirin EC 81 MG tablet Take 81 mg by mouth daily.    Marland Kitchen atorvastatin (LIPITOR) 80 MG tablet TAKE 1 TABLET BY MOUTH EVERY DAY AT 6PM 30 tablet 9  . BRILINTA 90 MG TABS tablet TAKE 1 TABLET BY MOUTH TWICE A DAY 180 tablet 3  . cetirizine (ZYRTEC) 10 MG tablet Take 10 mg by mouth every morning.    . cholecalciferol (VITAMIN D3) 25 MCG (1000 UT) tablet Take 2,000 Units by mouth daily.    . cycloSPORINE (RESTASIS) 0.05 % ophthalmic emulsion Place 1 drop into both eyes 2 (two) times daily.    . DULoxetine (CYMBALTA) 60  MG capsule Take 60 mg by mouth daily.    . Insulin Glargine (LANTUS SOLOSTAR) 100 UNIT/ML Solostar Pen Inject 15-35 Units into the skin 2 (two) times daily. 15 units in the morning and 35 units in the evening    . Insulin Lispro (HUMALOG KWIKPEN ) Inject 8-12 Units into the skin 3 (three) times daily. 8 units with breakfast, 10 units with lunch and 12 units with dinner    . lisinopril (ZESTRIL) 20 MG tablet Take 1 tablet (20 mg total) by mouth daily. 90 tablet 3  . metoprolol tartrate (LOPRESSOR) 50 MG tablet Take 1 tablet (50 mg total) by mouth 2 (two) times daily. 180 tablet 3  . nicotine (NICODERM CQ) 21 mg/24hr  patch Place 1 patch (21 mg total) onto the skin daily. 28 patch 0  . nitroGLYCERIN (NITROSTAT) 0.4 MG SL tablet Place 1 tablet (0.4 mg total) under the tongue every 5 (five) minutes x 3 doses as needed for chest pain. 25 tablet 2  . omeprazole (PRILOSEC) 20 MG capsule Take 20 mg by mouth daily.    Marland Kitchen thiamine (VITAMIN B-1) 100 MG tablet Take 100 mg by mouth daily.     No current facility-administered medications for this visit.   Allergies:  Penicillins   Social History: The patient  reports that she has been smoking. She has a 88.00 pack-year smoking history. She has never used smokeless tobacco. She reports current alcohol use. She reports that she does not use drugs.   Family History: The patient's family history includes AAA (abdominal aortic aneurysm) in her father; Cancer in her brother; Diabetes in her brother; Heart attack in her mother; Heart disease in her mother; Hypertension in her sister.   ROS:  Please see the history of present illness. Otherwise, complete review of systems is positive for {NONE DEFAULTED:18576::"none"}.  All other systems are reviewed and negative.   Physical Exam: VS:  There were no vitals taken for this visit., BMI There is no height or weight on file to calculate BMI.  Wt Readings from Last 3 Encounters:  02/13/19 161 lb 12.8 oz (73.4 kg)  02/06/19 163 lb (73.9 kg)  01/04/19 162 lb 3.2 oz (73.6 kg)    General: Patient appears comfortable at rest. HEENT: Conjunctiva and lids normal, oropharynx clear with moist mucosa. Neck: Supple, no elevated JVP or carotid bruits, no thyromegaly. Lungs: Clear to auscultation, nonlabored breathing at rest. Cardiac: Regular rate and rhythm, no S3 or significant systolic murmur, no pericardial rub. Abdomen: Soft, nontender, no hepatomegaly, bowel sounds present, no guarding or rebound. Extremities: No pitting edema, distal pulses 2+. Skin: Warm and dry. Musculoskeletal: No kyphosis. Neuropsychiatric: Alert and oriented  x3, affect grossly appropriate.  ECG:  {EKG/Telemetry Strips Reviewed:[id number]}  Recent Labwork: 06/16/2018: TSH 1.288 12/07/2018: ALT 18; AST 16; BUN 10; Creatinine, Ser 0.97; Potassium 4.6; Sodium 134 12/08/2018: Hemoglobin 13.9; Platelets 265     Component Value Date/Time   CHOL 137 06/17/2018 0357   TRIG 211 (H) 06/17/2018 0357   HDL 35 (L) 06/17/2018 0357   CHOLHDL 3.9 06/17/2018 0357   VLDL 42 (H) 06/17/2018 0357   LDLCALC 60 06/17/2018 0357    Other Studies Reviewed Today: Echocardiogram 12/01/2018  1. Left ventricular ejection fraction, by visual estimation, is 65 to 70%. The left ventricle has normal function. There is moderately increased left ventricular hypertrophy. 2. Left ventricular diastolic parameters are consistent with Grade II diastolic dysfunction (pseudonormalization). 3. Global right ventricle has normal systolic function.The right ventricular size is  normal. No increase in right ventricular wall thickness. 4. Left atrial size was mildly dilated. 5. Right atrial size was normal. 6. The mitral valve is normal in structure. Mild mitral valve regurgitation. No evidence of mitral stenosis. 7. The tricuspid valve is normal in structure. Tricuspid valve regurgitation is not demonstrated. 8. The aortic valve is normal in structure. Aortic valve regurgitation is not visualized. No evidence of aortic valve sclerosis or stenosis. 9. The pulmonic valve was normal in structure. Pulmonic valve regurgitation is not visualized. 10. Normal pulmonary artery systolic pressure. 11. The inferior vena cava is normal in size with greater than 50% respiratory variability, suggesting right atrial pressure of 3 mmHg.  Lower extremity Doppler studies with ABI 12/29/2018 Right ABIs appear increased compared to prior study on 05/2016. Left ABIs and TBIs appear decreased compared to prior study on 05/2016. See arterial duplex report Summary: Right: Resting right ankle-brachial  index indicates mild right lower extremity arterial disease. The right toe-brachial index is abnormal. Left: Resting left ankle-brachial index indicates moderate left lower extremity arterial disease. The left toe-brachial index is abnormal.  Lower extremity arterial duplex 12/29/2018 Summary: Right: No significant change compared to previous study. Atherosclerosis in the common femoral, femoral, popliteal and tibial arteries. 50-74% stenosis in the superficial femoral artery. Three vessel run off. Left: Mild progression is noted compared to previous study. Atherosclerosis in the common femoral, femoral, popliteal and tibial arteries. 50-74% stenosis in the common femoral and superficial femoral arteries. Three vessel run off. Heavy calcification in the left SFA therefore, they may be a more signficant stenosis  Carotid Artery Duplex Study 12/01/2018 Summary:  Right Carotid: Velocities in the right ICA are consistent with a 40-59%  stenosis. Non-hemodynamically significant plaque <50% noted in the CCA. Left Carotid: Velocities in the left ICA are consistent with a 40-59%  stenosis.Non-hemodynamically significant plaque <50% noted in the CCA. The ECA appears >50% stenosed.  Vertebrals: Bilateral vertebral arteries demonstrate antegrade flow.  Subclavians: Normal flow hemodynamics were seen in bilateral subclavian  arteries.  . Assessment and Plan:  1. Coronary artery disease involving native coronary artery of native heart without angina pectoris   2. Essential hypertension   3. Bilateral carotid artery stenosis   4. PAD (peripheral artery disease) (HCC)   5. Mixed hyperlipidemia   6. Tobacco use disorder      Medication Adjustments/Labs and Tests Ordered: Current medicines are reviewed at length with the patient today.  Concerns regarding medicines are outlined above.     Signed, Rennis Harding, NP 03/26/2019 10:27 PM    [facility] [address] [place], [place], [place] [address] Phone: [phone]; Fax: [phone]

## 2019-03-27 ENCOUNTER — Ambulatory Visit: Payer: Medicare PPO | Admitting: Physician Assistant

## 2019-03-29 NOTE — Progress Notes (Signed)
Please call patient and let them  know their  low dose Ct was read as a Lung RADS 2: nodules that are benign in appearance and behavior with a very low likelihood of becoming a clinically active cancer due to size or lack of growth. Recommendation per radiology is for a repeat LDCT in 12 months. .Please let them  know we will order and schedule their  annual screening scan for 03/2020. Please let them  know there was notation of CAD on their  scan.  Please remind the patient  that this is a non-gated exam therefore degree or severity of disease  cannot be determined. Please have them  follow up with their PCP regarding potential risk factor modification, dietary therapy or pharmacologic therapy if clinically indicated. Pt.  is  currently on statin therapy. Please place order for annual  screening scan for  03/2020 and fax results to PCP. Thanks so much. 

## 2019-03-30 ENCOUNTER — Other Ambulatory Visit: Payer: Self-pay | Admitting: *Deleted

## 2019-03-30 DIAGNOSIS — F17210 Nicotine dependence, cigarettes, uncomplicated: Secondary | ICD-10-CM

## 2019-03-30 DIAGNOSIS — Z87891 Personal history of nicotine dependence: Secondary | ICD-10-CM

## 2019-03-31 ENCOUNTER — Ambulatory Visit: Payer: Medicare PPO | Attending: Internal Medicine

## 2019-03-31 ENCOUNTER — Ambulatory Visit: Payer: Medicare PPO

## 2019-03-31 DIAGNOSIS — Z23 Encounter for immunization: Secondary | ICD-10-CM

## 2019-03-31 NOTE — Progress Notes (Signed)
   Covid-19 Vaccination Clinic  Name:  Sarah Francis    MRN: [id number] DOB: July 20, 1945  03/31/2019  [name] was observed post Covid-19 immunization for 15 minutes without incident. She was provided with Vaccine Information Sheet and instruction to access the V-Safe system.   [name] was instructed to call 911 with any severe reactions post vaccine: Marland Kitchen Difficulty breathing  . Swelling of face and throat  . A fast heartbeat  . A bad rash all over body  . Dizziness and weakness   Immunizations Administered    Name Date Dose VIS Date Route   Pfizer COVID-19 Vaccine 03/31/2019 12:29 PM 0.3 mL 01/05/2019 Intramuscular   Manufacturer: [employer], [employer]   Lot: [id number]   NDC: [address]-3

## 2019-05-01 ENCOUNTER — Ambulatory Visit: Payer: Medicare PPO | Attending: Internal Medicine

## 2019-05-01 DIAGNOSIS — Z23 Encounter for immunization: Secondary | ICD-10-CM

## 2019-05-01 NOTE — Progress Notes (Signed)
   Covid-19 Vaccination Clinic  Name:  Raeonna Milo    MRN: [id number] DOB: 09-27-1945  05/01/2019  Ms. Gullion was observed post Covid-19 immunization for 15 minutes without incident. She was provided with Vaccine Information Sheet and instruction to access the V-Safe system.   Ms. Hillebrand was instructed to call 911 with any severe reactions post vaccine: Marland Kitchen Difficulty breathing  . Swelling of face and throat  . A fast heartbeat  . A bad rash all over body  . Dizziness and weakness   Immunizations Administered    Name Date Dose VIS Date Route   Pfizer COVID-19 Vaccine 05/01/2019  1:58 PM 0.3 mL 01/05/2019 Intramuscular   Manufacturer: [employer], [employer]   Lot: [id number]   NDC: [address]-3

## 2019-08-06 ENCOUNTER — Encounter: Payer: Self-pay | Admitting: General Practice

## 2019-08-16 DIAGNOSIS — M810 Age-related osteoporosis without current pathological fracture: Secondary | ICD-10-CM | POA: Diagnosis not present

## 2019-08-16 DIAGNOSIS — E1165 Type 2 diabetes mellitus with hyperglycemia: Secondary | ICD-10-CM | POA: Diagnosis not present

## 2019-08-16 DIAGNOSIS — R300 Dysuria: Secondary | ICD-10-CM | POA: Diagnosis not present

## 2019-08-16 DIAGNOSIS — G479 Sleep disorder, unspecified: Secondary | ICD-10-CM | POA: Diagnosis not present

## 2019-08-16 DIAGNOSIS — Z794 Long term (current) use of insulin: Secondary | ICD-10-CM | POA: Diagnosis not present

## 2019-08-16 DIAGNOSIS — I2584 Coronary atherosclerosis due to calcified coronary lesion: Secondary | ICD-10-CM | POA: Diagnosis not present

## 2019-08-16 DIAGNOSIS — F17210 Nicotine dependence, cigarettes, uncomplicated: Secondary | ICD-10-CM | POA: Diagnosis not present

## 2019-08-16 DIAGNOSIS — I739 Peripheral vascular disease, unspecified: Secondary | ICD-10-CM | POA: Diagnosis not present

## 2019-09-07 ENCOUNTER — Encounter: Payer: Self-pay | Admitting: Endocrinology

## 2019-09-14 NOTE — Progress Notes (Addendum)
Cardiology Office Note:    Date:  09/17/2019   ID:  Sarah Francis, DOB [birth year], MRN [id number]  PCP:  Gweneth Dimitri, MD  Cardiologist:  Armanda Magic, MD    Referring MD: Gweneth Dimitri, MD   Chief Complaint  Patient presents with  . Coronary Artery Disease  . Hypertension  . Hyperlipidemia    History of Present Illness:    Sarah Francis is a 74 y.o. female with a hx of HTN, PAD,tobacco abuse, and strong familyof CAD. Patient had coronary CT in 2018 with elevated calcium score and three-vessel coronary calcification. Nuclear stress test showed no ischemia.She was treated medically at that time.   She presented to the hospital 06/20/2018 with NSTEMI treated with overlapping stent in the proximal and mid RCA with residual mild to moderate nonobstructive disease 40% left main, 60% distal LAD 50% proximal circumflex to be treated medically withAspirin and Brilinta for 1 year and atorvastatin increased to 80 mg daily. 2D echo showed normal LVEF 65% normal wall motion abnormality.She was seen back in the office 06/2018 complaining of inability to take a deep breath in but had not been using caffeine prior to taking her Brilinta so this was encouraged.   She is seen Dr. Allyson Sabal for her PAD.  She had some breakthrough angina in January due to missing doses of BB and Brilinta.   She is here today for followup and is doing well.  Since I saw her she has had 2 episodes of left arm pain that she took NTG which relieved it. She has chronic DOE that is stable.  Sometimes at night she will notice that she is breathing harder but nothing that sounds like PND or orthopnea.  She has some pedal edema.  She has PAD followed by Dr. Allyson Sabal.   She dizziness, palpitations or syncope. She is compliant with her meds and is tolerating meds with no SE.    Past Medical History:  Diagnosis Date  . Anxiety   . Aortic atherosclerosis (HCC)   . CAD (coronary artery disease), native coronary artery    06/20/18  PCI/DES overlapping stents to p/m RCA, mild disease in LAD, and Lcx  . Carotid artery stenosis    40-59% by dopplers 12/2018  . Diabetes mellitus without complication (HCC)   . GERD (gastroesophageal reflux disease)   . HLD (hyperlipidemia)   . Hypertension   . Major depression    initial diagnosis/medication around age 22  . Osteoporosis    w h/o sacral fracture  . PAD (peripheral artery disease) (HCC)    high grade stenosis in mid left SFA - followed by Dr. Allyson Sabal  . Syncope 12/2018   felt to be vasovagal    Past Surgical History:  Procedure Laterality Date  . ABDOMINAL HYSTERECTOMY    . CORONARY STENT INTERVENTION N/A 06/20/2018   Procedure: CORONARY STENT INTERVENTION;  Surgeon: Tonny Bollman, MD;  Location: [facility] INVASIVE CV LAB;  Service: Cardiovascular;  Laterality: N/A;  . KNEE SURGERY Right 2012  . LEFT HEART CATH AND CORONARY ANGIOGRAPHY N/A 06/20/2018   Procedure: LEFT HEART CATH AND CORONARY ANGIOGRAPHY;  Surgeon: Tonny Bollman, MD;  Location: [facility] INVASIVE CV LAB;  Service: Cardiovascular;  Laterality: N/A;    Current Medications: Current Meds  Medication Sig  . aspirin EC 81 MG tablet Take 81 mg by mouth daily.  Marland Kitchen atorvastatin (LIPITOR) 80 MG tablet TAKE 1 TABLET BY MOUTH EVERY DAY AT 6PM  . BRILINTA 90 MG TABS tablet TAKE 1 TABLET BY MOUTH  TWICE A DAY  . cetirizine (ZYRTEC) 10 MG tablet Take 10 mg by mouth every morning.  . cholecalciferol (VITAMIN D3) 25 MCG (1000 UT) tablet Take 2,000 Units by mouth daily.  . cycloSPORINE (RESTASIS) 0.05 % ophthalmic emulsion Place 1 drop into both eyes 2 (two) times daily.  . DULoxetine (CYMBALTA) 60 MG capsule Take 60 mg by mouth daily.  Marland Kitchen HUMALOG KWIKPEN 100 UNIT/ML KwikPen Inject 12 Units into the skin in the morning and at bedtime.  . insulin glargine (LANTUS SOLOSTAR) 100 UNIT/ML Solostar Pen Inject 30 Units into the skin 2 (two) times daily.  Marland Kitchen lisinopril (ZESTRIL) 20 MG tablet Take 1 tablet (20 mg total) by mouth daily.  .  metoprolol tartrate (LOPRESSOR) 50 MG tablet Take 1 tablet (50 mg total) by mouth 2 (two) times daily.  . nicotine (NICODERM CQ) 21 mg/24hr patch Place 1 patch (21 mg total) onto the skin daily.  . nitroGLYCERIN (NITROSTAT) 0.4 MG SL tablet Place 1 tablet (0.4 mg total) under the tongue every 5 (five) minutes x 3 doses as needed for chest pain.  Marland Kitchen omeprazole (PRILOSEC) 20 MG capsule Take 20 mg by mouth daily.  Marland Kitchen thiamine (VITAMIN B-1) 100 MG tablet Take 100 mg by mouth daily.     Allergies:   Penicillins   Social History   Socioeconomic History  . Marital status: Single    Spouse name: Not on file  . Number of children: Not on file  . Years of education: Not on file  . Highest education level: Not on file  Occupational History  . Not on file  Tobacco Use  . Smoking status: Current Every Day Smoker    Packs/day: 1.00    Years: 88.00    Pack years: 88.00  . Smokeless tobacco: Never Used  . Tobacco comment: Currently smoker 1ppd  Substance and Sexual Activity  . Alcohol use: Yes    Comment: 1-2 glasses of Wine per day  . Drug use: No  . Sexual activity: Not Currently  Other Topics Concern  . Not on file  Social History Narrative  . Not on file   Social Determinants of Health   Financial Resource Strain:   . Difficulty of Paying Living Expenses: Not on file  Food Insecurity:   . Worried About Programme researcher, broadcasting/film/video in the Last Year: Not on file  . Ran Out of Food in the Last Year: Not on file  Transportation Needs:   . Lack of Transportation (Medical): Not on file  . Lack of Transportation (Non-Medical): Not on file  Physical Activity:   . Days of Exercise per Week: Not on file  . Minutes of Exercise per Session: Not on file  Stress:   . Feeling of Stress : Not on file  Social Connections:   . Frequency of Communication with Friends and Family: Not on file  . Frequency of Social Gatherings with Friends and Family: Not on file  . Attends Religious Services: Not on file   . Active Member of Clubs or Organizations: Not on file  . Attends Banker Meetings: Not on file  . Marital Status: Not on file     Family History: The patient's family history includes AAA (abdominal aortic aneurysm) in her father; Cancer in her brother; Diabetes in her brother; Heart attack in her mother; Heart disease in her mother; Hypertension in her sister.  ROS:   Please see the history of present illness.    ROS  All other  systems reviewed and negative.   EKGs/Labs/Other Studies Reviewed:    The following studies were reviewed today: Cath, labs   EKG:  EKG is not ordered today  Recent Labs: 12/07/2018: ALT 18; BUN 10; Creatinine, Ser 0.97; Potassium 4.6; Sodium 134 12/08/2018: Hemoglobin 13.9; Platelets 265   Recent Lipid Panel    Component Value Date/Time   CHOL 137 06/17/2018 0357   TRIG 211 (H) 06/17/2018 0357   HDL 35 (L) 06/17/2018 0357   CHOLHDL 3.9 06/17/2018 0357   VLDL 42 (H) 06/17/2018 0357   LDLCALC 60 06/17/2018 0357    Physical Exam:    VS:  BP 116/66   Pulse 79   Ht 5\' 1"  (1.549 m)   Wt 156 lb 9.6 oz (71 kg)   SpO2 98%   BMI 29.59 kg/m     Wt Readings from Last 3 Encounters:  09/17/19 156 lb 9.6 oz (71 kg)  02/13/19 161 lb 12.8 oz (73.4 kg)  02/06/19 163 lb (73.9 kg)     GEN: Well nourished, well developed in no acute distress HEENT: Normal NECK: No JVD; No carotid bruits LYMPHATICS: No lymphadenopathy CARDIAC:RRR, no murmurs, rubs, gallops RESPIRATORY:  Clear to auscultation without rales, wheezing or rhonchi  ABDOMEN: Soft, non-tender, non-distended MUSCULOSKELETAL:  No edema; No deformity  SKIN: Warm and dry NEUROLOGIC:  Alert and oriented x 3 PSYCHIATRIC:  Normal affect    ASSESSMENT:    1. Coronary artery disease involving native coronary artery of native heart without angina pectoris   2. Mixed hyperlipidemia   3. Essential hypertension   4. Peripheral arterial disease (HCC)   5. Bilateral carotid artery  stenosis   6. Dizziness    PLAN:    In order of problems listed above:  1.  ASCAD -s/p NSTEMI 05/2018 -cath with overlapping stent in the proximal and mid RCA with residual mild to moderate nonobstructive disease 40% left main, 60% distal LAD 50% proximal circumflex -noncompliant with BB and Brilinta in the past resulting in breakthrough angina but now back on Meds -she denies any further angina -continue ASA 81mg  daily,  statin and BB -decrease Brilinta to 60mg  BID as she is a year out from her PCI  2.  HLD -LDL goal < 70 -LDL was 48 in April 2021 -continue atorvastatin 80mg  daily  3.  HTN -BP controlled -continue Lisinopril 20mg  dialy and Lopressor 50mg  BID -SCr 0.84 and K+ 4.6 in April 2021   4.  PAD -has seen Dr. following ABIs which revealed a right ABI of 0.91 and a left ABI of 0.68 with what appeared to be a high-grade lesion in the mid left SFA with monophasic waveforms below that.  -medical management recommended -needs to quit smoking   5.  Carotid artery stenosis -dopplers 11/2018 showed 40-59% stenosis bilaterally -repeat dopplers 11/2019 -continue ASA and statin  6.  Presyncope -2D echo normal LVF with no significant valvular heart dz in Nov 2020  7.  Chronic LE edema -Likely multifactorial from LE neuropathy, significant dietary indiscretion with NA and plantar fasciitis -I encouraged her to cut out all table salt and follow < 2gm Na diet   Medication Adjustments/Labs and Tests Ordered: Current medicines are reviewed at length with the patient today.  Concerns regarding medicines are outlined above.  No orders of the defined types were placed in this encounter.  No orders of the defined types were placed in this encounter.   Signed, , MD  09/17/2019 11:52 AM  [facility]

## 2019-09-17 ENCOUNTER — Other Ambulatory Visit: Payer: Self-pay | Admitting: Cardiology

## 2019-09-17 ENCOUNTER — Telehealth: Payer: Self-pay

## 2019-09-17 ENCOUNTER — Encounter: Payer: Self-pay | Admitting: Cardiology

## 2019-09-17 ENCOUNTER — Ambulatory Visit: Payer: Medicare PPO | Admitting: Cardiology

## 2019-09-17 ENCOUNTER — Other Ambulatory Visit: Payer: Self-pay

## 2019-09-17 VITALS — BP 116/66 | HR 79 | Ht 61.0 in | Wt 156.6 lb

## 2019-09-17 DIAGNOSIS — I10 Essential (primary) hypertension: Secondary | ICD-10-CM

## 2019-09-17 DIAGNOSIS — E782 Mixed hyperlipidemia: Secondary | ICD-10-CM

## 2019-09-17 DIAGNOSIS — I2510 Atherosclerotic heart disease of native coronary artery without angina pectoris: Secondary | ICD-10-CM | POA: Diagnosis not present

## 2019-09-17 DIAGNOSIS — R600 Localized edema: Secondary | ICD-10-CM | POA: Diagnosis not present

## 2019-09-17 DIAGNOSIS — I6523 Occlusion and stenosis of bilateral carotid arteries: Secondary | ICD-10-CM | POA: Diagnosis not present

## 2019-09-17 DIAGNOSIS — I739 Peripheral vascular disease, unspecified: Secondary | ICD-10-CM | POA: Diagnosis not present

## 2019-09-17 DIAGNOSIS — R42 Dizziness and giddiness: Secondary | ICD-10-CM | POA: Diagnosis not present

## 2019-09-17 MED ORDER — TICAGRELOR 60 MG PO TABS: 60.0000 mg | ORAL_TABLET | Freq: Two times a day (BID) | ORAL | 3 refills | Status: DC

## 2019-09-17 NOTE — Patient Instructions (Addendum)
Medication Instructions:  Your physician has recommended you make the following change in your medication:  1) DECREASE Brilinta to 60 mg twice per day  *If you need a refill on your cardiac medications before your next appointment, please call your pharmacy*  Testing/Procedures: Your physician has requested that you have a carotid duplex in November 2021. This test is an ultrasound of the carotid arteries in your neck. It looks at blood flow through these arteries that supply the brain with blood. Allow one hour for this exam. There are no restrictions or special instructions.  Follow-Up: At [facility], you and your health needs are our priority.  As part of our continuing mission to provide you with exceptional heart care, we have created designated Provider Care Teams.  These Care Teams include your primary Cardiologist (physician) and Advanced Practice Providers (APPs -  Physician Assistants and Nurse Practitioners) who all work together to provide you with the care you need, when you need it.  Your next appointment:   1 year(s)  The format for your next appointment:   In Person  Provider:   You may see Armanda Magic, MD or one of the following Advanced Practice Providers on your designated Care Team:    Ronie Spies, PA-C  Jacolyn Reedy, PA-C   Other Instructions  Low-Sodium Eating Plan Sodium, which is an element that makes up salt, helps you maintain a healthy balance of fluids in your body. Too much sodium can increase your blood pressure and cause fluid and waste to be held in your body. Your health care provider or dietitian may recommend following this plan if you have high blood pressure (hypertension), kidney disease, liver disease, or heart failure. Eating less sodium can help lower your blood pressure, reduce swelling, and protect your heart, liver, and kidneys. What are tips for following this plan? General guidelines  Most people on this plan should limit their  sodium intake to 1,500-2,000 mg (milligrams) of sodium each day. Reading food labels   The Nutrition Facts label lists the amount of sodium in one serving of the food. If you eat more than one serving, you must multiply the listed amount of sodium by the number of servings.  Choose foods with less than 140 mg of sodium per serving.  Avoid foods with 300 mg of sodium or more per serving. Shopping  Look for lower-sodium products, often labeled as "low-sodium" or "no salt added."  Always check the sodium content even if foods are labeled as "unsalted" or "no salt added".  Buy fresh foods. ? Avoid canned foods and premade or frozen meals. ? Avoid canned, cured, or processed meats  Buy breads that have less than 80 mg of sodium per slice. Cooking  Eat more home-cooked food and less restaurant, buffet, and fast food.  Avoid adding salt when cooking. Use salt-free seasonings or herbs instead of table salt or sea salt. Check with your health care provider or pharmacist before using salt substitutes.  Cook with plant-based oils, such as canola, sunflower, or olive oil. Meal planning  When eating at a restaurant, ask that your food be prepared with less salt or no salt, if possible.  Avoid foods that contain MSG (monosodium glutamate). MSG is sometimes added to [place] food, bouillon, and some canned foods. What foods are recommended? The items listed may not be a complete list. Talk with your dietitian about what dietary choices are best for you. Grains Low-sodium cereals, including oats, puffed wheat and rice, and shredded  wheat. Low-sodium crackers. Unsalted rice. Unsalted pasta. Low-sodium bread. Whole-grain breads and whole-grain pasta. Vegetables Fresh or frozen vegetables. "No salt added" canned vegetables. "No salt added" tomato sauce and paste. Low-sodium or reduced-sodium tomato and vegetable juice. Fruits Fresh, frozen, or canned fruit. Fruit juice. Meats and other protein  foods Fresh or frozen (no salt added) meat, poultry, seafood, and fish. Low-sodium canned tuna and salmon. Unsalted nuts. Dried peas, beans, and lentils without added salt. Unsalted canned beans. Eggs. Unsalted nut butters. Dairy Milk. Soy milk. Cheese that is naturally low in sodium, such as ricotta cheese, fresh mozzarella, or Swiss cheese Low-sodium or reduced-sodium cheese. Cream cheese. Yogurt. Fats and oils Unsalted butter. Unsalted margarine with no trans fat. Vegetable oils such as canola or olive oils. Seasonings and other foods Fresh and dried herbs and spices. Salt-free seasonings. Low-sodium mustard and ketchup. Sodium-free salad dressing. Sodium-free light mayonnaise. Fresh or refrigerated horseradish. Lemon juice. Vinegar. Homemade, reduced-sodium, or low-sodium soups. Unsalted popcorn and pretzels. Low-salt or salt-free chips. What foods are not recommended? The items listed may not be a complete list. Talk with your dietitian about what dietary choices are best for you. Grains Instant hot cereals. Bread stuffing, pancake, and biscuit mixes. Croutons. Seasoned rice or pasta mixes. Noodle soup cups. Boxed or frozen macaroni and cheese. Regular salted crackers. Self-rising flour. Vegetables Sauerkraut, pickled vegetables, and relishes. Olives. [place] fries. Onion rings. Regular canned vegetables (not low-sodium or reduced-sodium). Regular canned tomato sauce and paste (not low-sodium or reduced-sodium). Regular tomato and vegetable juice (not low-sodium or reduced-sodium). Frozen vegetables in sauces. Meats and other protein foods Meat or fish that is salted, canned, smoked, spiced, or pickled. Bacon, ham, sausage, hotdogs, corned beef, chipped beef, packaged lunch meats, salt pork, jerky, pickled herring, anchovies, regular canned tuna, sardines, salted nuts. Dairy Processed cheese and cheese spreads. Cheese curds. Blue cheese. Feta cheese. String cheese. Regular cottage cheese.  Buttermilk. Canned milk. Fats and oils Salted butter. Regular margarine. Ghee. Bacon fat. Seasonings and other foods Onion salt, garlic salt, seasoned salt, table salt, and sea salt. Canned and packaged gravies. Worcestershire sauce. Tartar sauce. Barbecue sauce. Teriyaki sauce. Soy sauce, including reduced-sodium. Steak sauce. Fish sauce. Oyster sauce. Cocktail sauce. Horseradish that you find on the shelf. Regular ketchup and mustard. Meat flavorings and tenderizers. Bouillon cubes. Hot sauce and Tabasco sauce. Premade or packaged marinades. Premade or packaged taco seasonings. Relishes. Regular salad dressings. Salsa. Potato and tortilla chips. Corn chips and puffs. Salted popcorn and pretzels. Canned or dried soups. Pizza. Frozen entrees and pot pies. Summary  Eating less sodium can help lower your blood pressure, reduce swelling, and protect your heart, liver, and kidneys.  Most people on this plan should limit their sodium intake to 1,500-2,000 mg (milligrams) of sodium each day.  Canned, boxed, and frozen foods are high in sodium. Restaurant foods, fast foods, and pizza are also very high in sodium. You also get sodium by adding salt to food.  Try to cook at home, eat more fresh fruits and vegetables, and eat less fast food, canned, processed, or prepared foods. This information is not intended to replace advice given to you by your health care provider. Make sure you discuss any questions you have with your health care provider. Document Revised: 12/24/2016 Document Reviewed: 01/05/2016 Elsevier Patient Education  2020 [employer].

## 2019-09-17 NOTE — Addendum Note (Signed)
Addended by: Theresia Majors on: 09/17/2019 12:10 PM   Modules accepted: Orders

## 2019-09-17 NOTE — Telephone Encounter (Signed)
Sarah Francis has arrived late for a couple of her most recent appointments within our practice: - February 06, 2019, she arrived 25 minutes late and we were able to reschedule her to see an APP who had an open slot around that same time otherwise we would've had to cancel her.  - February 13, 2019, she arrive 10 minutes early and all went well. - March 28, 2019, she was a no show. - Today, September 17, 2019, she was checked in 21 minutes late. We recognize that there may have been a delay at registration so Dr. Mayford Knife saw the patient.  At Dr. Norris Cross request, I stepped into the exam room after her visit was complete to meet the patient and discuss how important being on time for her appointments is. Sarah Francis was pleasant, receptive and very apologetic. She stated that she thought her appointment today was at 11:30am, not 11:20am. Sarah Francis confirmed that she does receive the automated reminder call 3 days prior which does state the time and date of the appointment.  To manage her expectations, I informed her that we do have a 20 minute late policy and if she arrives late in the future Dr. Mayford Knife may not be able to accommodate working her in that day. She was thankful for [place] letting her know.

## 2019-10-30 ENCOUNTER — Other Ambulatory Visit (HOSPITAL_COMMUNITY): Payer: Self-pay | Admitting: Cardiovascular Disease

## 2019-10-30 ENCOUNTER — Ambulatory Visit (HOSPITAL_COMMUNITY)
Admission: RE | Admit: 2019-10-30 | Discharge: 2019-10-30 | Disposition: A | Discharge status: Home / Self Care | Payer: Medicare PPO | Source: Ambulatory Visit | Attending: Cardiovascular Disease | Admitting: Cardiovascular Disease

## 2019-10-30 ENCOUNTER — Encounter: Payer: Self-pay | Admitting: Cardiovascular Disease

## 2019-10-30 ENCOUNTER — Other Ambulatory Visit: Payer: Self-pay | Admitting: Cardiology

## 2019-10-30 ENCOUNTER — Other Ambulatory Visit: Payer: Self-pay

## 2019-10-30 ENCOUNTER — Ambulatory Visit: Payer: Medicare PPO | Admitting: Cardiovascular Disease

## 2019-10-30 DIAGNOSIS — I6523 Occlusion and stenosis of bilateral carotid arteries: Secondary | ICD-10-CM

## 2019-10-30 DIAGNOSIS — F17200 Nicotine dependence, unspecified, uncomplicated: Secondary | ICD-10-CM | POA: Diagnosis not present

## 2019-10-30 DIAGNOSIS — I739 Peripheral vascular disease, unspecified: Secondary | ICD-10-CM | POA: Diagnosis not present

## 2019-10-30 NOTE — Progress Notes (Signed)
10/30/2019 [place]   1945-05-14  [id number]  Primary Physician Gweneth Dimitri, MD Primary Cardiologist: Runell Gess MD Nicholes Calamity, [place]  HPI:  Sarah Francis is a 74 y.o.  mildly overweight Caucasian female mother of one daughter, grandmother of 2 grandchildren referred to me by Dr. Mayford Knife for peripheral vascular evaluation because of abnormal Doppler studies.  I last saw her in the office 12/20/2018.  She is a third grade teacher. Cardiac risk factors notable for 50 pack years smoking one pack per day recalcitrant to risk factor modification. She drinks 2-3 glasses of wine a night. She has treated hypertension, diabetes and hyperlipidemia as well as family history of heart disease. Mother died in her 59s of a myocardial infarction. Her father died in his 40s of an abdominal aortic aneurysm. She has never had a heart attack or stroke. She denies chest pain but does have some mild dyspnea. She has coronary calcification on chest CT and a negative Myoview stress test. Lower extremity Dopplers performed 06/01/16 revealed right ABI 0.74 and a left of 0.9. She has moderate bilateral proximal SFA disease which does not appear to be significant obstructive.  She was hospitalized in May and had PCI drug-eluting stenting of high-grade proximal and mid RCA disease by Dr. Excell Seltzer with noncritical disease otherwise.  LV function was normal.    She does complain of some purplish discoloration in her feet which I suspect is related to smoking.  She also has some atypical bilateral foot pain which sounds like plantar fasciitis but she really denies claudication.  She has moderate bilateral ICA stenosis on her carotid Doppler studies.  Current Meds  Medication Sig  . aspirin EC 81 MG tablet Take 81 mg by mouth daily.  Marland Kitchen atorvastatin (LIPITOR) 80 MG tablet TAKE 1 TABLET BY MOUTH EVERY DAY AT 6PM  . cetirizine (ZYRTEC) 10 MG tablet Take 10 mg by mouth every morning.  . cholecalciferol (VITAMIN D3)  25 MCG (1000 UT) tablet Take 2,000 Units by mouth daily.  . cycloSPORINE (RESTASIS) 0.05 % ophthalmic emulsion Place 1 drop into both eyes 2 (two) times daily.  . DULoxetine (CYMBALTA) 60 MG capsule Take 60 mg by mouth daily.  Marland Kitchen HUMALOG KWIKPEN 100 UNIT/ML KwikPen Inject 12 Units into the skin in the morning and at bedtime.  . insulin glargine (LANTUS SOLOSTAR) 100 UNIT/ML Solostar Pen Inject 30 Units into the skin 2 (two) times daily.  Marland Kitchen lisinopril (ZESTRIL) 5 MG tablet   . metoprolol tartrate (LOPRESSOR) 50 MG tablet TAKE 1 TABLET BY MOUTH TWICE A DAY  . nicotine (NICODERM CQ) 21 mg/24hr patch Place 1 patch (21 mg total) onto the skin daily.  . nitroGLYCERIN (NITROSTAT) 0.4 MG SL tablet Place 1 tablet (0.4 mg total) under the tongue every 5 (five) minutes x 3 doses as needed for chest pain.  Marland Kitchen omeprazole (PRILOSEC) 20 MG capsule Take 20 mg by mouth daily.  Marland Kitchen thiamine (VITAMIN B-1) 100 MG tablet Take 100 mg by mouth daily.  . ticagrelor (BRILINTA) 60 MG TABS tablet Take 1 tablet (60 mg total) by mouth 2 (two) times daily.     Allergies  Allergen Reactions  . Penicillins Rash    Did it involve swelling of the face/tongue/throat, SOB, or low BP? Yes Did it involve sudden or severe rash/hives, skin peeling, or any reaction on the inside of your mouth or nose? Unknown Did you need to seek medical attention at a hospital or doctor's office? Unknown When  did it last happen?Pt was a child, had lip swelling If all above answers are "NO", may proceed with cephalosporin use.     Social History   Socioeconomic History  . Marital status: Single    Spouse name: Not on file  . Number of children: Not on file  . Years of education: Not on file  . Highest education level: Not on file  Occupational History  . Not on file  Tobacco Use  . Smoking status: Current Every Day Smoker    Packs/day: 1.00    Years: 88.00    Pack years: 88.00  . Smokeless tobacco: Never Used  . Tobacco comment:  Currently smoker 1ppd  Substance and Sexual Activity  . Alcohol use: Yes    Comment: 1-2 glasses of Wine per day  . Drug use: No  . Sexual activity: Not Currently  Other Topics Concern  . Not on file  Social History Narrative  . Not on file   Social Determinants of Health   Financial Resource Strain:   . Difficulty of Paying Living Expenses: Not on file  Food Insecurity:   . Worried About Programme researcher, broadcasting/film/video in the Last Year: Not on file  . Ran Out of Food in the Last Year: Not on file  Transportation Needs:   . Lack of Transportation (Medical): Not on file  . Lack of Transportation (Non-Medical): Not on file  Physical Activity:   . Days of Exercise per Week: Not on file  . Minutes of Exercise per Session: Not on file  Stress:   . Feeling of Stress : Not on file  Social Connections:   . Frequency of Communication with Friends and Family: Not on file  . Frequency of Social Gatherings with Friends and Family: Not on file  . Attends Religious Services: Not on file  . Active Member of Clubs or Organizations: Not on file  . Attends Banker Meetings: Not on file  . Marital Status: Not on file  Intimate Partner Violence:   . Fear of Current or Ex-Partner: Not on file  . Emotionally Abused: Not on file  . Physically Abused: Not on file  . Sexually Abused: Not on file     Review of Systems: General: negative for chills, fever, night sweats or weight changes.  Cardiovascular: negative for chest pain, dyspnea on exertion, edema, orthopnea, palpitations, paroxysmal nocturnal dyspnea or shortness of breath Dermatological: negative for rash Respiratory: negative for cough or wheezing Urologic: negative for hematuria Abdominal: negative for nausea, vomiting, diarrhea, bright red blood per rectum, melena, or hematemesis Neurologic: negative for visual changes, syncope, or dizziness All other systems reviewed and are otherwise negative except as noted above.    Blood  pressure 138/70, pulse 86, height 5\' 1"  (1.549 m), weight 156 lb 6.4 oz (70.9 kg).  General appearance: alert and no distress Neck: no adenopathy, no JVD, supple, symmetrical, trachea midline, thyroid not enlarged, symmetric, no tenderness/mass/nodules and Soft bilateral carotid bruits Lungs: clear to auscultation bilaterally Heart: regular rate and rhythm, S1, S2 normal, no murmur, click, rub or gallop Extremities: extremities normal, atraumatic, no cyanosis or edema Pulses: 2+ and symmetric Skin: Purplish discoloration of her feet bilaterally Neurologic: Alert and oriented X 3, normal strength and tone. Normal symmetric reflexes. Normal coordination and gait  EKG not performed today  ASSESSMENT AND PLAN:   Tobacco use disorder Continues to smoke 1 pack/day.  We talked about importance of smoking cessation with regards to progression of her vascular  disease.  Peripheral arterial disease (HCC) History of PAD with Dopplers performed 12/29/2018 revealing a right ABI of 0.91 and left of 0.68.  She did have mild disease in her proximal right SFA and moderate in her mid left SFA although she denies claudication.  We will continue to get lower extremity Dopplers on her on annual basis  Carotid artery stenosis Moderate bilateral ICA stenosis by duplex ultrasound 12/01/2018.  She is scheduled to get repeat Dopplers today.      Runell Gess MD FACP,FACC,FAHA, [facility] 10/30/2019 2:28 PM

## 2019-10-30 NOTE — Assessment & Plan Note (Signed)
Moderate bilateral ICA stenosis by duplex ultrasound 12/01/2018.  She is scheduled to get repeat Dopplers today.

## 2019-10-30 NOTE — Patient Instructions (Signed)
  Follow-Up: At [facility], you and your health needs are our priority.  As part of our continuing mission to provide you with exceptional heart care, we have created designated Provider Care Teams.  These Care Teams include your primary Cardiologist (physician) and Advanced Practice Providers (APPs -  Physician Assistants and Nurse Practitioners) who all work together to provide you with the care you need, when you need it.  We recommend signing up for the patient portal called "MyChart".  Sign up information is provided on this After Visit Summary.  MyChart is used to connect with patients for Virtual Visits (Telemedicine).  Patients are able to view lab/test results, encounter notes, upcoming appointments, etc.  Non-urgent messages can be sent to your provider as well.   To learn more about what you can do with MyChart, go to [email or web address].    Your next appointment:   12 month(s)  The format for your next appointment:   In Person  Provider:   You may see JONATHAN BERRY MD or one of the following Advanced Practice Providers on your designated Care Team:    Luke Kilroy, PA-C  Callie Goodrich, PA-C  Jesse Cleaver, FNP     

## 2019-10-30 NOTE — Assessment & Plan Note (Signed)
Continues to smoke 1 pack/day.  We talked about importance of smoking cessation with regards to progression of her vascular disease.

## 2019-10-30 NOTE — Assessment & Plan Note (Signed)
History of PAD with Dopplers performed 12/29/2018 revealing a right ABI of 0.91 and left of 0.68.  She did have mild disease in her proximal right SFA and moderate in her mid left SFA although she denies claudication.  We will continue to get lower extremity Dopplers on her on annual basis

## 2019-12-17 DIAGNOSIS — E782 Mixed hyperlipidemia: Secondary | ICD-10-CM | POA: Diagnosis not present

## 2019-12-17 DIAGNOSIS — F17210 Nicotine dependence, cigarettes, uncomplicated: Secondary | ICD-10-CM | POA: Diagnosis not present

## 2019-12-17 DIAGNOSIS — I2584 Coronary atherosclerosis due to calcified coronary lesion: Secondary | ICD-10-CM | POA: Diagnosis not present

## 2019-12-17 DIAGNOSIS — I739 Peripheral vascular disease, unspecified: Secondary | ICD-10-CM | POA: Diagnosis not present

## 2019-12-17 DIAGNOSIS — I779 Disorder of arteries and arterioles, unspecified: Secondary | ICD-10-CM | POA: Diagnosis not present

## 2019-12-17 DIAGNOSIS — G479 Sleep disorder, unspecified: Secondary | ICD-10-CM | POA: Diagnosis not present

## 2019-12-17 DIAGNOSIS — Z1211 Encounter for screening for malignant neoplasm of colon: Secondary | ICD-10-CM | POA: Diagnosis not present

## 2019-12-17 DIAGNOSIS — Z23 Encounter for immunization: Secondary | ICD-10-CM | POA: Diagnosis not present

## 2019-12-17 DIAGNOSIS — E1165 Type 2 diabetes mellitus with hyperglycemia: Secondary | ICD-10-CM | POA: Diagnosis not present

## 2019-12-17 DIAGNOSIS — M810 Age-related osteoporosis without current pathological fracture: Secondary | ICD-10-CM | POA: Diagnosis not present

## 2020-01-11 ENCOUNTER — Other Ambulatory Visit: Payer: Self-pay | Admitting: Family Medicine

## 2020-01-11 DIAGNOSIS — M810 Age-related osteoporosis without current pathological fracture: Secondary | ICD-10-CM

## 2020-01-16 ENCOUNTER — Other Ambulatory Visit: Payer: Self-pay | Admitting: Family Medicine

## 2020-01-16 ENCOUNTER — Other Ambulatory Visit: Payer: Self-pay | Admitting: Cardiovascular Disease

## 2020-01-16 DIAGNOSIS — N6011 Diffuse cystic mastopathy of right breast: Secondary | ICD-10-CM

## 2020-01-16 DIAGNOSIS — I739 Peripheral vascular disease, unspecified: Secondary | ICD-10-CM

## 2020-01-24 ENCOUNTER — Other Ambulatory Visit: Payer: Self-pay

## 2020-01-24 ENCOUNTER — Ambulatory Visit (HOSPITAL_COMMUNITY)
Admission: RE | Admit: 2020-01-24 | Discharge: 2020-01-24 | Disposition: A | Discharge status: Home / Self Care | Payer: Medicare PPO | Source: Ambulatory Visit | Attending: Cardiology | Admitting: Cardiology

## 2020-01-24 DIAGNOSIS — I739 Peripheral vascular disease, unspecified: Secondary | ICD-10-CM | POA: Diagnosis not present

## 2020-01-30 ENCOUNTER — Telehealth: Payer: Self-pay

## 2020-01-30 NOTE — Telephone Encounter (Signed)
-----   Message from Runell Gess, MD sent at 01/24/2020  4:21 PM EST ----- Mod progression. If still asymptomatic repeat 12 months

## 2020-03-06 DIAGNOSIS — Z961 Presence of intraocular lens: Secondary | ICD-10-CM | POA: Diagnosis not present

## 2020-03-06 DIAGNOSIS — E119 Type 2 diabetes mellitus without complications: Secondary | ICD-10-CM | POA: Diagnosis not present

## 2020-03-06 DIAGNOSIS — H524 Presbyopia: Secondary | ICD-10-CM | POA: Diagnosis not present

## 2020-03-06 DIAGNOSIS — Z794 Long term (current) use of insulin: Secondary | ICD-10-CM | POA: Diagnosis not present

## 2020-04-24 DIAGNOSIS — E1351 Other specified diabetes mellitus with diabetic peripheral angiopathy without gangrene: Secondary | ICD-10-CM | POA: Diagnosis not present

## 2020-04-24 DIAGNOSIS — F17210 Nicotine dependence, cigarettes, uncomplicated: Secondary | ICD-10-CM | POA: Diagnosis not present

## 2020-04-24 DIAGNOSIS — I779 Disorder of arteries and arterioles, unspecified: Secondary | ICD-10-CM | POA: Diagnosis not present

## 2020-04-24 DIAGNOSIS — E1165 Type 2 diabetes mellitus with hyperglycemia: Secondary | ICD-10-CM | POA: Diagnosis not present

## 2020-04-24 DIAGNOSIS — E1142 Type 2 diabetes mellitus with diabetic polyneuropathy: Secondary | ICD-10-CM | POA: Diagnosis not present

## 2020-04-24 DIAGNOSIS — I2584 Coronary atherosclerosis due to calcified coronary lesion: Secondary | ICD-10-CM | POA: Diagnosis not present

## 2020-04-24 DIAGNOSIS — G479 Sleep disorder, unspecified: Secondary | ICD-10-CM | POA: Diagnosis not present

## 2020-04-24 DIAGNOSIS — Z79899 Other long term (current) drug therapy: Secondary | ICD-10-CM | POA: Diagnosis not present

## 2020-04-24 DIAGNOSIS — M810 Age-related osteoporosis without current pathological fracture: Secondary | ICD-10-CM | POA: Diagnosis not present

## 2020-04-24 DIAGNOSIS — K30 Functional dyspepsia: Secondary | ICD-10-CM | POA: Diagnosis not present

## 2020-04-24 DIAGNOSIS — F419 Anxiety disorder, unspecified: Secondary | ICD-10-CM | POA: Diagnosis not present

## 2020-04-24 DIAGNOSIS — E782 Mixed hyperlipidemia: Secondary | ICD-10-CM | POA: Diagnosis not present

## 2020-04-30 ENCOUNTER — Other Ambulatory Visit: Payer: Medicare PPO

## 2020-04-30 ENCOUNTER — Ambulatory Visit
Admission: RE | Admit: 2020-04-30 | Discharge: 2020-04-30 | Disposition: A | Discharge status: Home / Self Care | Payer: Medicare PPO | Source: Ambulatory Visit | Attending: Family Medicine | Admitting: Family Medicine

## 2020-04-30 DIAGNOSIS — N6011 Diffuse cystic mastopathy of right breast: Secondary | ICD-10-CM

## 2020-04-30 DIAGNOSIS — R928 Other abnormal and inconclusive findings on diagnostic imaging of breast: Secondary | ICD-10-CM | POA: Diagnosis not present

## 2020-06-19 IMAGING — DX CHEST - 2 VIEW
2 series · 2 of 2 positions shown · non-contrast
Comparison: Chest radiograph November 02, 2013 and chest CT March 21, 2017

CLINICAL DATA: Chest discomfort and shortness of breath

EXAM:
CHEST - 2 VIEW

[chest pa]
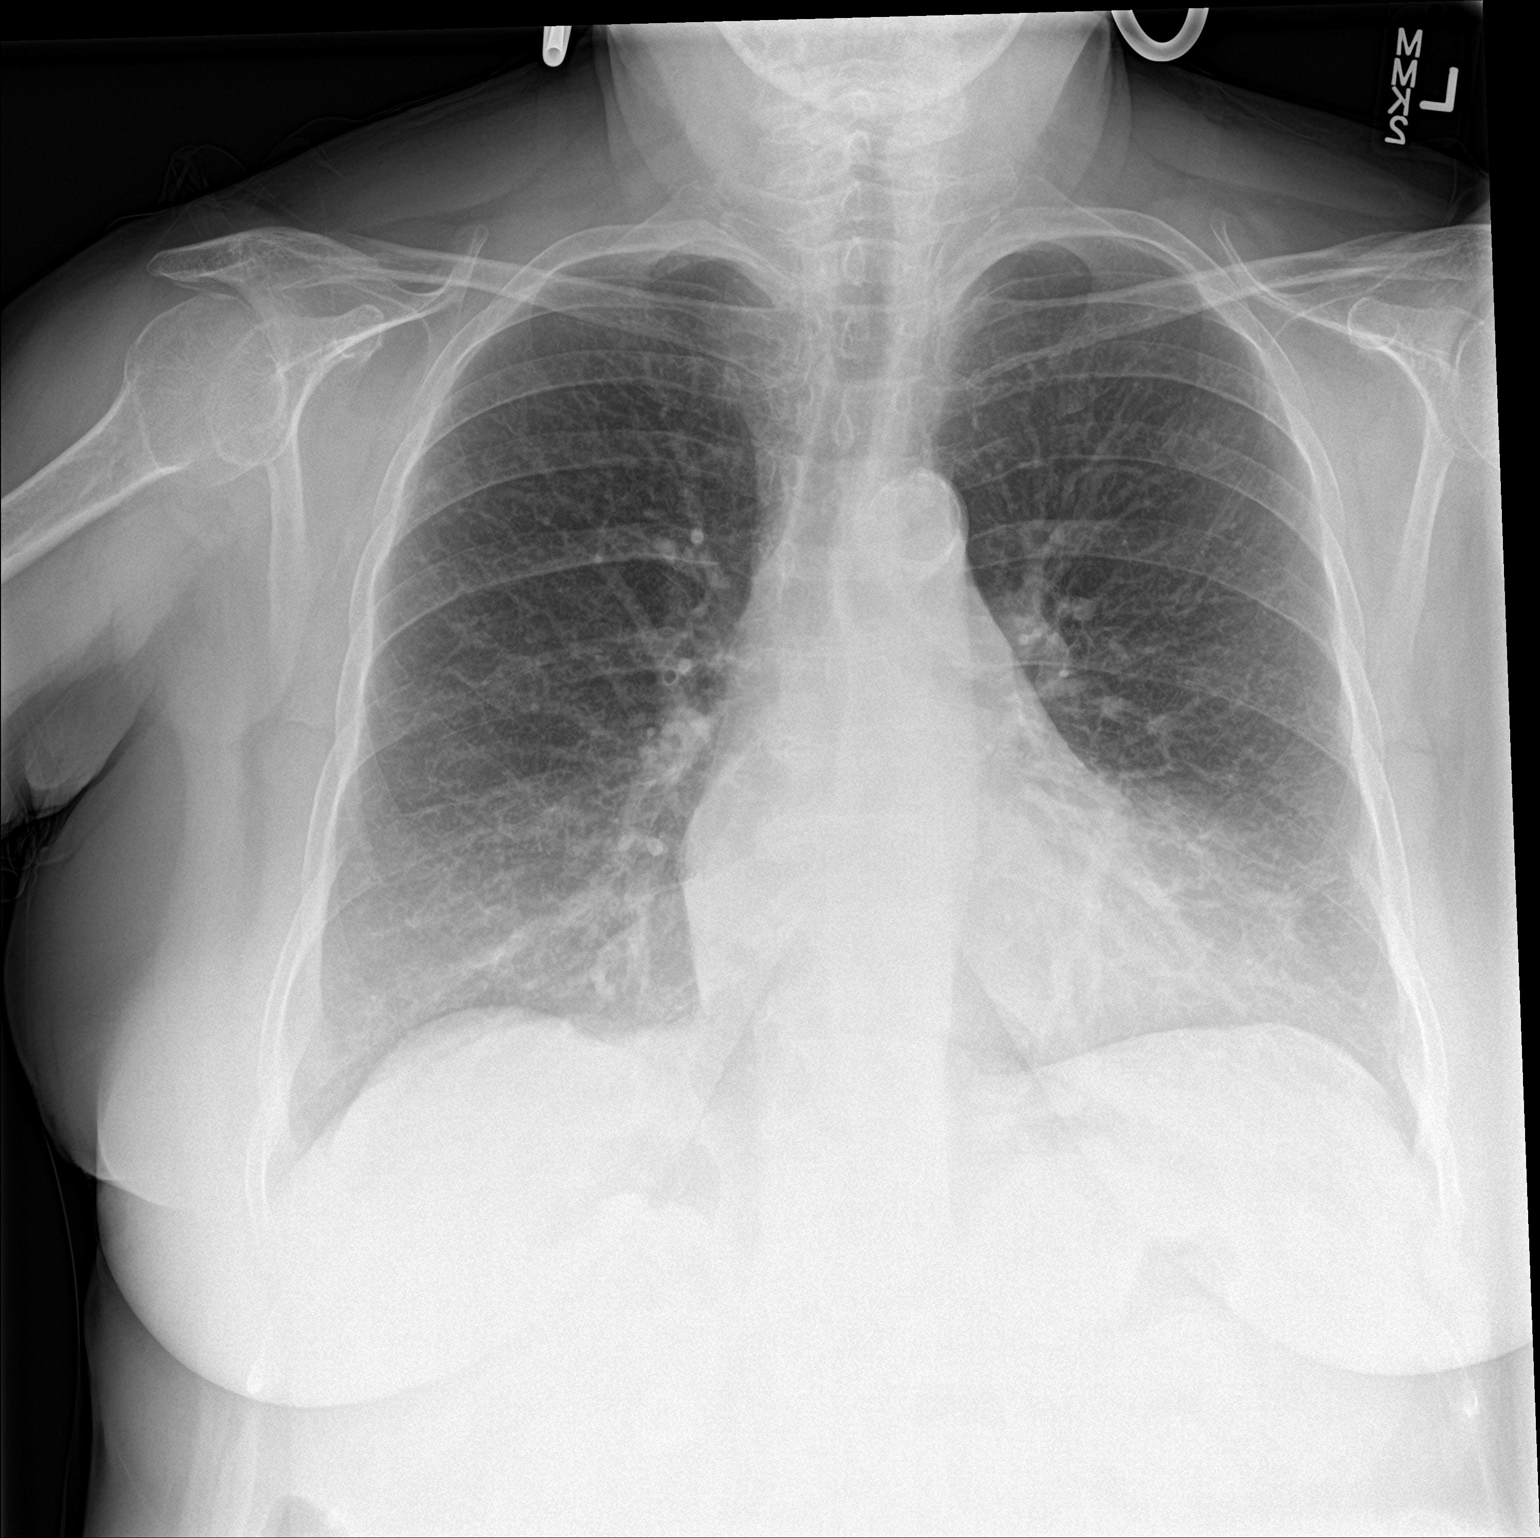

[chest lat]
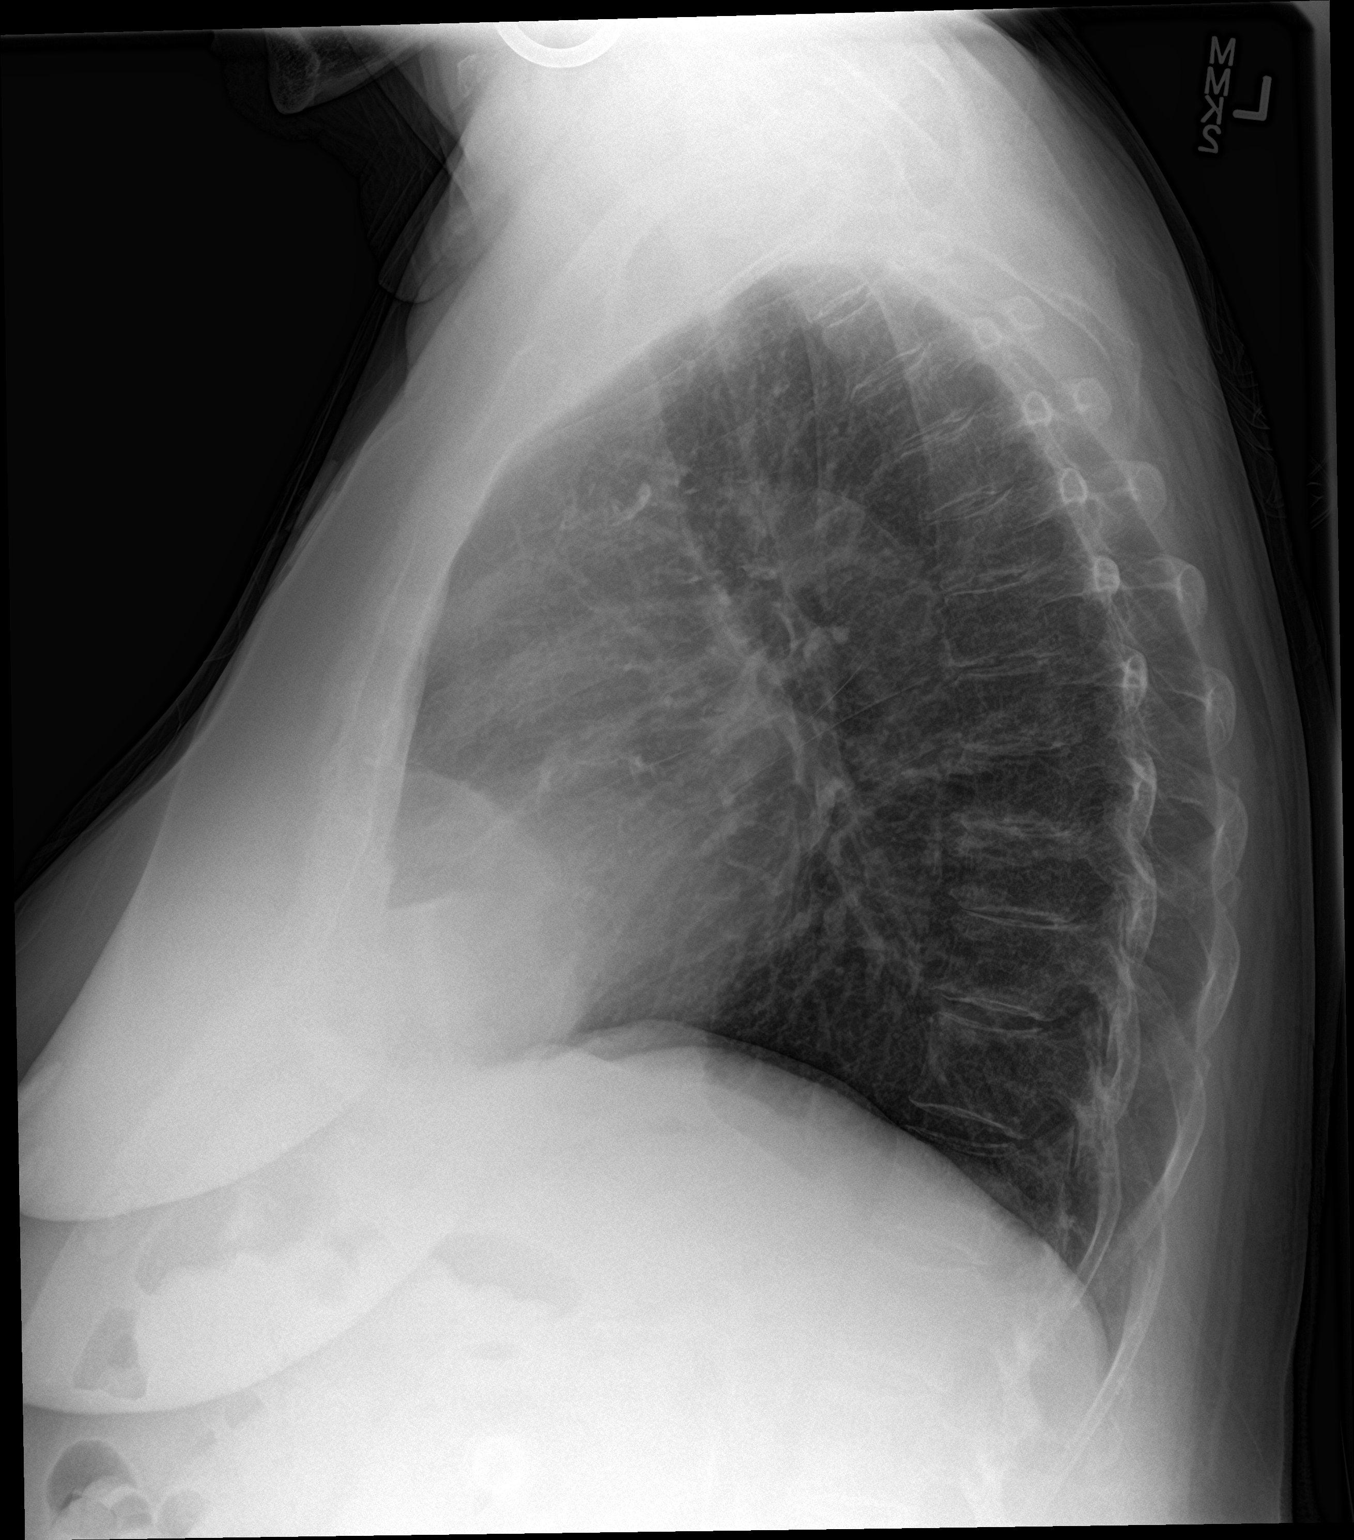

[2 of 2 positions shown; findings below may reference images not displayed]

FINDINGS: There is atelectatic change in the left base. There is no edema or
consolidation. Heart size and pulmonary vascularity are normal. No
adenopathy. There is aortic atherosclerosis. No bone lesions.
IMPRESSION: Left base atelectasis. No edema or consolidation. Heart size within
normal limits. Aortic Atherosclerosis ([id number]).

## 2020-06-28 ENCOUNTER — Other Ambulatory Visit: Payer: Self-pay | Admitting: Cardiology

## 2020-06-30 MED ORDER — NITROGLYCERIN 0.4 MG SL SUBL: SUBLINGUAL_TABLET | SUBLINGUAL | 1 refills | Status: DC

## 2020-06-30 NOTE — Addendum Note (Signed)
Addended by: Margaret Pyle D on: 06/30/2020 01:59 PM   Modules accepted: Orders

## 2020-07-17 DIAGNOSIS — M810 Age-related osteoporosis without current pathological fracture: Secondary | ICD-10-CM | POA: Diagnosis not present

## 2020-07-17 DIAGNOSIS — I10 Essential (primary) hypertension: Secondary | ICD-10-CM | POA: Diagnosis not present

## 2020-07-17 DIAGNOSIS — E1351 Other specified diabetes mellitus with diabetic peripheral angiopathy without gangrene: Secondary | ICD-10-CM | POA: Diagnosis not present

## 2020-07-17 DIAGNOSIS — I2584 Coronary atherosclerosis due to calcified coronary lesion: Secondary | ICD-10-CM | POA: Diagnosis not present

## 2020-07-17 DIAGNOSIS — E1142 Type 2 diabetes mellitus with diabetic polyneuropathy: Secondary | ICD-10-CM | POA: Diagnosis not present

## 2020-07-17 DIAGNOSIS — E782 Mixed hyperlipidemia: Secondary | ICD-10-CM | POA: Diagnosis not present

## 2020-07-17 DIAGNOSIS — E1169 Type 2 diabetes mellitus with other specified complication: Secondary | ICD-10-CM | POA: Diagnosis not present

## 2020-07-17 DIAGNOSIS — E1165 Type 2 diabetes mellitus with hyperglycemia: Secondary | ICD-10-CM | POA: Diagnosis not present

## 2020-08-13 DIAGNOSIS — I214 Non-ST elevation (NSTEMI) myocardial infarction: Secondary | ICD-10-CM | POA: Diagnosis not present

## 2020-08-13 DIAGNOSIS — E1165 Type 2 diabetes mellitus with hyperglycemia: Secondary | ICD-10-CM | POA: Diagnosis not present

## 2020-08-13 DIAGNOSIS — I2584 Coronary atherosclerosis due to calcified coronary lesion: Secondary | ICD-10-CM | POA: Diagnosis not present

## 2020-08-13 DIAGNOSIS — M810 Age-related osteoporosis without current pathological fracture: Secondary | ICD-10-CM | POA: Diagnosis not present

## 2020-08-13 DIAGNOSIS — I10 Essential (primary) hypertension: Secondary | ICD-10-CM | POA: Diagnosis not present

## 2020-08-13 DIAGNOSIS — E1169 Type 2 diabetes mellitus with other specified complication: Secondary | ICD-10-CM | POA: Diagnosis not present

## 2020-08-13 DIAGNOSIS — E1351 Other specified diabetes mellitus with diabetic peripheral angiopathy without gangrene: Secondary | ICD-10-CM | POA: Diagnosis not present

## 2020-08-13 DIAGNOSIS — E1142 Type 2 diabetes mellitus with diabetic polyneuropathy: Secondary | ICD-10-CM | POA: Diagnosis not present

## 2020-08-13 DIAGNOSIS — E782 Mixed hyperlipidemia: Secondary | ICD-10-CM | POA: Diagnosis not present

## 2020-08-14 DIAGNOSIS — E1142 Type 2 diabetes mellitus with diabetic polyneuropathy: Secondary | ICD-10-CM | POA: Diagnosis not present

## 2020-08-14 DIAGNOSIS — E1165 Type 2 diabetes mellitus with hyperglycemia: Secondary | ICD-10-CM | POA: Diagnosis not present

## 2020-08-22 ENCOUNTER — Other Ambulatory Visit: Payer: Self-pay | Admitting: Cardiology

## 2020-08-22 MED ORDER — NITROGLYCERIN 0.4 MG SL SUBL: SUBLINGUAL_TABLET | SUBLINGUAL | 0 refills | Status: DC

## 2020-08-22 NOTE — Telephone Encounter (Signed)
LEFT MESSAGE FOR PATIENT TO CONTACT OFFICE TO SCHEDULE A FOLLOW UP APPOINTMENT WITH DR TURNER.

## 2020-09-07 ENCOUNTER — Other Ambulatory Visit: Payer: Self-pay

## 2020-09-07 ENCOUNTER — Emergency Department (HOSPITAL_COMMUNITY): Payer: Medicare PPO

## 2020-09-07 ENCOUNTER — Emergency Department (HOSPITAL_COMMUNITY)
Admission: EM | Admit: 2020-09-07 | Discharge: 2020-09-07 | Disposition: A | Discharge status: Home / Self Care | Payer: Medicare PPO | Source: Home / Self Care | Attending: Emergency Medicine | Admitting: Emergency Medicine

## 2020-09-07 DIAGNOSIS — E782 Mixed hyperlipidemia: Secondary | ICD-10-CM | POA: Diagnosis present

## 2020-09-07 DIAGNOSIS — R112 Nausea with vomiting, unspecified: Secondary | ICD-10-CM | POA: Diagnosis not present

## 2020-09-07 DIAGNOSIS — E860 Dehydration: Secondary | ICD-10-CM | POA: Diagnosis present

## 2020-09-07 DIAGNOSIS — E785 Hyperlipidemia, unspecified: Secondary | ICD-10-CM | POA: Insufficient documentation

## 2020-09-07 DIAGNOSIS — K5730 Diverticulosis of large intestine without perforation or abscess without bleeding: Secondary | ICD-10-CM | POA: Diagnosis not present

## 2020-09-07 DIAGNOSIS — M810 Age-related osteoporosis without current pathological fracture: Secondary | ICD-10-CM | POA: Diagnosis present

## 2020-09-07 DIAGNOSIS — J9811 Atelectasis: Secondary | ICD-10-CM | POA: Diagnosis not present

## 2020-09-07 DIAGNOSIS — K8012 Calculus of gallbladder with acute and chronic cholecystitis without obstruction: Secondary | ICD-10-CM | POA: Diagnosis not present

## 2020-09-07 DIAGNOSIS — E871 Hypo-osmolality and hyponatremia: Secondary | ICD-10-CM | POA: Diagnosis present

## 2020-09-07 DIAGNOSIS — K82A1 Gangrene of gallbladder in cholecystitis: Secondary | ICD-10-CM | POA: Diagnosis present

## 2020-09-07 DIAGNOSIS — I2510 Atherosclerotic heart disease of native coronary artery without angina pectoris: Secondary | ICD-10-CM | POA: Diagnosis present

## 2020-09-07 DIAGNOSIS — R1084 Generalized abdominal pain: Secondary | ICD-10-CM | POA: Diagnosis not present

## 2020-09-07 DIAGNOSIS — N390 Urinary tract infection, site not specified: Secondary | ICD-10-CM | POA: Diagnosis present

## 2020-09-07 DIAGNOSIS — I214 Non-ST elevation (NSTEMI) myocardial infarction: Secondary | ICD-10-CM | POA: Diagnosis not present

## 2020-09-07 DIAGNOSIS — R072 Precordial pain: Secondary | ICD-10-CM | POA: Insufficient documentation

## 2020-09-07 DIAGNOSIS — E1351 Other specified diabetes mellitus with diabetic peripheral angiopathy without gangrene: Secondary | ICD-10-CM | POA: Diagnosis not present

## 2020-09-07 DIAGNOSIS — K219 Gastro-esophageal reflux disease without esophagitis: Secondary | ICD-10-CM | POA: Diagnosis present

## 2020-09-07 DIAGNOSIS — I499 Cardiac arrhythmia, unspecified: Secondary | ICD-10-CM | POA: Diagnosis not present

## 2020-09-07 DIAGNOSIS — K449 Diaphragmatic hernia without obstruction or gangrene: Secondary | ICD-10-CM | POA: Diagnosis present

## 2020-09-07 DIAGNOSIS — Z20822 Contact with and (suspected) exposure to covid-19: Secondary | ICD-10-CM | POA: Insufficient documentation

## 2020-09-07 DIAGNOSIS — E1140 Type 2 diabetes mellitus with diabetic neuropathy, unspecified: Secondary | ICD-10-CM | POA: Diagnosis present

## 2020-09-07 DIAGNOSIS — I10 Essential (primary) hypertension: Secondary | ICD-10-CM | POA: Diagnosis present

## 2020-09-07 DIAGNOSIS — F17210 Nicotine dependence, cigarettes, uncomplicated: Secondary | ICD-10-CM | POA: Insufficient documentation

## 2020-09-07 DIAGNOSIS — I700 Atherosclerosis of aorta: Secondary | ICD-10-CM | POA: Diagnosis present

## 2020-09-07 DIAGNOSIS — I739 Peripheral vascular disease, unspecified: Secondary | ICD-10-CM | POA: Diagnosis not present

## 2020-09-07 DIAGNOSIS — I6521 Occlusion and stenosis of right carotid artery: Secondary | ICD-10-CM | POA: Diagnosis not present

## 2020-09-07 DIAGNOSIS — Z79899 Other long term (current) drug therapy: Secondary | ICD-10-CM | POA: Diagnosis not present

## 2020-09-07 DIAGNOSIS — M549 Dorsalgia, unspecified: Secondary | ICD-10-CM | POA: Diagnosis not present

## 2020-09-07 DIAGNOSIS — R1012 Left upper quadrant pain: Secondary | ICD-10-CM | POA: Diagnosis not present

## 2020-09-07 DIAGNOSIS — I6523 Occlusion and stenosis of bilateral carotid arteries: Secondary | ICD-10-CM | POA: Insufficient documentation

## 2020-09-07 DIAGNOSIS — Z01810 Encounter for preprocedural cardiovascular examination: Secondary | ICD-10-CM | POA: Diagnosis not present

## 2020-09-07 DIAGNOSIS — E119 Type 2 diabetes mellitus without complications: Secondary | ICD-10-CM | POA: Diagnosis not present

## 2020-09-07 DIAGNOSIS — E1169 Type 2 diabetes mellitus with other specified complication: Secondary | ICD-10-CM | POA: Diagnosis not present

## 2020-09-07 DIAGNOSIS — E559 Vitamin D deficiency, unspecified: Secondary | ICD-10-CM | POA: Diagnosis present

## 2020-09-07 DIAGNOSIS — E7800 Pure hypercholesterolemia, unspecified: Secondary | ICD-10-CM | POA: Diagnosis not present

## 2020-09-07 DIAGNOSIS — R0789 Other chest pain: Secondary | ICD-10-CM | POA: Diagnosis not present

## 2020-09-07 DIAGNOSIS — K8010 Calculus of gallbladder with chronic cholecystitis without obstruction: Secondary | ICD-10-CM | POA: Diagnosis not present

## 2020-09-07 DIAGNOSIS — J984 Other disorders of lung: Secondary | ICD-10-CM | POA: Diagnosis not present

## 2020-09-07 DIAGNOSIS — R100 Acute abdomen: Secondary | ICD-10-CM | POA: Diagnosis not present

## 2020-09-07 DIAGNOSIS — R188 Other ascites: Secondary | ICD-10-CM | POA: Diagnosis not present

## 2020-09-07 DIAGNOSIS — Z7902 Long term (current) use of antithrombotics/antiplatelets: Secondary | ICD-10-CM | POA: Diagnosis not present

## 2020-09-07 DIAGNOSIS — I6529 Occlusion and stenosis of unspecified carotid artery: Secondary | ICD-10-CM

## 2020-09-07 DIAGNOSIS — N3289 Other specified disorders of bladder: Secondary | ICD-10-CM | POA: Diagnosis not present

## 2020-09-07 DIAGNOSIS — Z794 Long term (current) use of insulin: Secondary | ICD-10-CM | POA: Diagnosis not present

## 2020-09-07 DIAGNOSIS — I2584 Coronary atherosclerosis due to calcified coronary lesion: Secondary | ICD-10-CM | POA: Diagnosis not present

## 2020-09-07 DIAGNOSIS — K760 Fatty (change of) liver, not elsewhere classified: Secondary | ICD-10-CM | POA: Diagnosis present

## 2020-09-07 DIAGNOSIS — R1011 Right upper quadrant pain: Secondary | ICD-10-CM

## 2020-09-07 DIAGNOSIS — E1165 Type 2 diabetes mellitus with hyperglycemia: Secondary | ICD-10-CM | POA: Diagnosis not present

## 2020-09-07 DIAGNOSIS — Z7982 Long term (current) use of aspirin: Secondary | ICD-10-CM | POA: Insufficient documentation

## 2020-09-07 DIAGNOSIS — E1142 Type 2 diabetes mellitus with diabetic polyneuropathy: Secondary | ICD-10-CM | POA: Diagnosis not present

## 2020-09-07 DIAGNOSIS — K8000 Calculus of gallbladder with acute cholecystitis without obstruction: Secondary | ICD-10-CM | POA: Diagnosis not present

## 2020-09-07 DIAGNOSIS — G2581 Restless legs syndrome: Secondary | ICD-10-CM | POA: Diagnosis present

## 2020-09-07 DIAGNOSIS — R109 Unspecified abdominal pain: Secondary | ICD-10-CM | POA: Diagnosis not present

## 2020-09-07 DIAGNOSIS — R079 Chest pain, unspecified: Secondary | ICD-10-CM | POA: Diagnosis not present

## 2020-09-07 DIAGNOSIS — R1010 Upper abdominal pain, unspecified: Secondary | ICD-10-CM | POA: Diagnosis not present

## 2020-09-07 DIAGNOSIS — K8001 Calculus of gallbladder with acute cholecystitis with obstruction: Secondary | ICD-10-CM | POA: Diagnosis present

## 2020-09-07 DIAGNOSIS — Z0389 Encounter for observation for other suspected diseases and conditions ruled out: Secondary | ICD-10-CM | POA: Diagnosis not present

## 2020-09-07 DIAGNOSIS — R000 Tachycardia, unspecified: Secondary | ICD-10-CM | POA: Diagnosis not present

## 2020-09-07 DIAGNOSIS — K8020 Calculus of gallbladder without cholecystitis without obstruction: Secondary | ICD-10-CM | POA: Diagnosis not present

## 2020-09-07 DIAGNOSIS — I252 Old myocardial infarction: Secondary | ICD-10-CM | POA: Diagnosis not present

## 2020-09-07 DIAGNOSIS — E1151 Type 2 diabetes mellitus with diabetic peripheral angiopathy without gangrene: Secondary | ICD-10-CM | POA: Diagnosis present

## 2020-09-07 DIAGNOSIS — E669 Obesity, unspecified: Secondary | ICD-10-CM | POA: Diagnosis present

## 2020-09-07 DIAGNOSIS — K810 Acute cholecystitis: Secondary | ICD-10-CM | POA: Diagnosis not present

## 2020-09-07 LAB — COMPREHENSIVE METABOLIC PANEL
ALT: 43 U/L (ref 0–44)
AST: 32 U/L (ref 15–41)
Albumin: 4 g/dL (ref 3.5–5.0)
Alkaline Phosphatase: 64 U/L (ref 38–126)
Anion gap: 12 (ref 5–15)
BUN: 13 mg/dL (ref 8–23)
CO2: 21 mmol/L — ABNORMAL LOW (ref 22–32)
Calcium: 9.2 mg/dL (ref 8.9–10.3)
Chloride: 94 mmol/L — ABNORMAL LOW (ref 98–111)
Creatinine, Ser: 0.69 mg/dL (ref 0.44–1.00)
GFR, Estimated: >60 mL/min (ref >60)
Glucose, Bld: 148 mg/dL — ABNORMAL HIGH (ref 70–99)
Potassium: 4.3 mmol/L (ref 3.5–5.1)
Sodium: 127 mmol/L — ABNORMAL LOW (ref 135–145)
Total Bilirubin: 1 mg/dL (ref 0.3–1.2)
Total Protein: 7 g/dL (ref 6.5–8.1)

## 2020-09-07 LAB — CBC WITH DIFFERENTIAL/PLATELET
Abs Immature Granulocytes: 0.1 10*3/uL — ABNORMAL HIGH (ref 0.00–0.07)
Basophils Absolute: 0 10*3/uL (ref 0.0–0.1)
Basophils Relative: 0 %
Eosinophils Absolute: 0.1 10*3/uL (ref 0.0–0.5)
Eosinophils Relative: 0 %
HCT: 40.7 % (ref 36.0–46.0)
Hemoglobin: 13.9 g/dL (ref 12.0–15.0)
Immature Granulocytes: 1 %
Lymphocytes Relative: 7 %
Lymphs Abs: 1 10*3/uL (ref 0.7–4.0)
MCH: 33.3 pg (ref 26.0–34.0)
MCHC: 34.2 g/dL (ref 30.0–36.0)
MCV: 97.6 fL (ref 80.0–100.0)
Monocytes Absolute: 1.3 10*3/uL — ABNORMAL HIGH (ref 0.1–1.0)
Monocytes Relative: 9 %
Neutro Abs: 11.6 10*3/uL — ABNORMAL HIGH (ref 1.7–7.7)
Neutrophils Relative %: 83 %
Platelets: 270 10*3/uL (ref 150–400)
RBC: 4.17 MIL/uL (ref 3.87–5.11)
RDW: 13.2 % (ref 11.5–15.5)
WBC: 14.1 10*3/uL — ABNORMAL HIGH (ref 4.0–10.5)
nRBC: 0 % (ref 0.0–0.2)

## 2020-09-07 LAB — I-STAT CHEM 8, ED
BUN: 15 mg/dL (ref 8–23)
Calcium, Ion: 1.09 mmol/L — ABNORMAL LOW (ref 1.15–1.40)
Chloride: 94 mmol/L — ABNORMAL LOW (ref 98–111)
Creatinine, Ser: 0.6 mg/dL (ref 0.44–1.00)
Glucose, Bld: 145 mg/dL — ABNORMAL HIGH (ref 70–99)
HCT: 40 % (ref 36.0–46.0)
Hemoglobin: 13.6 g/dL (ref 12.0–15.0)
Potassium: 4.2 mmol/L (ref 3.5–5.1)
Sodium: 128 mmol/L — ABNORMAL LOW (ref 135–145)
TCO2: 24 mmol/L (ref 22–32)

## 2020-09-07 LAB — URINALYSIS, ROUTINE W REFLEX MICROSCOPIC
Bilirubin Urine: NEGATIVE
Glucose, UA: NEGATIVE mg/dL
Ketones, ur: 5 mg/dL — AB
Leukocytes,Ua: NEGATIVE
Nitrite: NEGATIVE
Protein, ur: NEGATIVE mg/dL
Specific Gravity, Urine: 1.009 (ref 1.005–1.030)
pH: 7 (ref 5.0–8.0)

## 2020-09-07 LAB — RESP PANEL BY RT-PCR (FLU A&B, COVID) ARPGX2
Influenza A by PCR: NEGATIVE
Influenza B by PCR: NEGATIVE
SARS Coronavirus 2 by RT PCR: NEGATIVE

## 2020-09-07 LAB — PROTIME-INR
INR: 0.9 (ref 0.8–1.2)
Prothrombin Time: 12.5 seconds (ref 11.4–15.2)

## 2020-09-07 LAB — TROPONIN I (HIGH SENSITIVITY)
Troponin I (High Sensitivity): 14 ng/L (ref <18)
Troponin I (High Sensitivity): 16 ng/L (ref <18)

## 2020-09-07 LAB — LIPASE, BLOOD: Lipase: 29 U/L (ref 11–51)

## 2020-09-07 LAB — APTT: aPTT: 27 seconds (ref 24–36)

## 2020-09-07 MED ORDER — IOHEXOL 350 MG/ML SOLN
75.0000 mL | Freq: Once | INTRAVENOUS | Status: AC | PRN
  Administered 2020-09-07: 75 mL via INTRAVENOUS

## 2020-09-07 MED ORDER — ONDANSETRON HCL 4 MG/2ML IJ SOLN
4.0000 mg | Freq: Once | INTRAMUSCULAR | Status: AC
  Administered 2020-09-07: 4 mg via INTRAVENOUS
  Filled 2020-09-07: qty 2

## 2020-09-07 MED ORDER — FENTANYL CITRATE PF 50 MCG/ML IJ SOSY
100.0000 ug | PREFILLED_SYRINGE | Freq: Once | INTRAMUSCULAR | Status: AC
  Administered 2020-09-07: 100 ug via INTRAVENOUS
  Filled 2020-09-07: qty 2

## 2020-09-07 NOTE — ED Triage Notes (Signed)
Pt bib gems c/o chest pain, abdominal pain, and L sided back pain starting approx 1 hour ago. Pt also experiencing n/v. EMS reports pt was diaphoretic and pale on arrival. Hx of MI. Hx of gallstones. 4mg  zofran given PTA. 18g LAC.   BP: 172/100  HR: 70  Spo2: 98% RA  RR: 18  CBG: 127

## 2020-09-07 NOTE — ED Provider Notes (Signed)
Blood pressure (!) 202/91, pulse 85, temperature 97.6 F (36.4 C), temperature source Oral, resp. rate 18, height 5\' 1"  (1.549 m), weight 70.8 kg, SpO2 92 %.  Assuming care from Dr. .  In short, Sarah Francis is a 75 y.o. female with a chief complaint of Chest Pain and Abdominal Pain .  Refer to the original H&P for additional details.  The current plan of care is to follow up on repeat troponin and RUQ 65.    EKG Interpretation  Date/Time:  Sunday September 07 2020 04:44:19 EDT Ventricular Rate:  80 PR Interval:  162 QRS Duration: 86 QT Interval:  385 QTC Calculation: 445 R Axis:   43 Text Interpretation: Sinus rhythm Abnormal R-wave progression, early transition Abnrm T, consider ischemia, anterolateral lds Baseline wander in lead(s) V3 Confirmed by 12-06-2004 (Zadie Rhine) on 09/07/2020 4:45:56 AM       Patient's second troponin is back and unchanged from her initial value.  Right upper quadrant ultrasound shows gallstones but no radiographic evidence of obstruction or concern for acute cholecystitis.  Patient's pain is mainly on the left upper abdomen on my reevaluation.  She is no longer having discomfort either in her chest or abdomen. We discussed observation admission for possible anginal equivalent but patient is very resistant to staying in the hospital. Through shared decision making discussion she plans to return home. She plans to call her cardiology team in the morning to ensure close follow-up.  I will give contact information for general surgery regarding her cholelithiasis.  We discussed strict emergency department return precautions should her pain return/worsen. Patient feels comfortable with the plan at discharge.      09/09/2020, MD 09/07/20 [phone]

## 2020-09-07 NOTE — ED Notes (Signed)
Patient transported to Ultrasound 

## 2020-09-07 NOTE — ED Notes (Signed)
Patient transported to CT with this RN 

## 2020-09-07 NOTE — Discharge Instructions (Addendum)
You were seen in the emergency room today with pain in your abdomen and chest.  Your heart labs are normal but we discussed the need for close follow-up with your cardiology doctors.  Please call them first thing tomorrow morning to schedule a follow-up appointment this coming week.  If your pain worsens you need to return to the emergency department immediately.  On your CT scan you showed some very narrow vessels in your neck which require additional ultrasound tests.  If not treated, these can increase your risk for stroke.  Your primary care doctor can help to arrange this.  Please call her first thing tomorrow morning as well.   I have also listed the name of a general surgery practice here in town.  You have gallstones which can occasionally cause symptoms.  They can discuss treatment options.  Please call to schedule an appointment there as well.   If you develop any new or suddenly worsening pain I would like for you to return to the emergency department immediately for reevaluation and/or call 911.

## 2020-09-07 NOTE — ED Provider Notes (Signed)
[facility] EMERGENCY DEPARTMENT Provider Note   CSN: [id number] Arrival date & time:        History Chief Complaint  Patient presents with   Chest Pain   Abdominal Pain    Sarah Francis is a 75 y.o. female.  The history is provided by the patient.  Back Pain Location:  Unable to specify Quality: "ripping" Pain severity:  Severe Onset quality:  Sudden Duration:  4 hours Timing:  Constant Progression:  Worsening Chronicity:  New Relieved by:  Nothing Worsened by:  Nothing Patient with history of CAD, peripheral vascular disease, hypertension presents with back, chest, abdominal pain.  Patient reports she went to bed without any difficulty.  She reports she woke up several hours ago with "ripping" back pain.  She then began developing diffuse abdominal pain as well as chest pain.  She first thought that she may be having a heart attack  She also reports that she is known to have a gallstone.  When she thought it was a cardiac event, patient reports she did take nitro with minimal improvement  She reports when she had severe pain she had diaphoresis and vomiting.  No arm or leg weakness.  No recent syncope.    She denies any recent cough Past Medical History:  Diagnosis Date   Anxiety    Aortic atherosclerosis (HCC)    CAD (coronary artery disease), native coronary artery    06/20/18 PCI/DES overlapping stents to p/m RCA, mild disease in LAD, and Lcx   Carotid artery stenosis    40-59% by dopplers 12/2018   Diabetes mellitus without complication (HCC)    GERD (gastroesophageal reflux disease)    HLD (hyperlipidemia)    Hypertension    Major depression    initial diagnosis/medication around age 67   Osteoporosis    w h/o sacral fracture   PAD (peripheral artery disease) (HCC)    high grade stenosis in mid left SFA - followed by Dr. Allyson Sabal   Syncope 12/2018   felt to be vasovagal    Patient Active Problem List   Diagnosis Date Noted   Carotid  artery stenosis    PAD (peripheral artery disease) (HCC)    HLD (hyperlipidemia)    Syncope 12/2018   CAD (coronary artery disease) 06/28/2018   Family history of early CAD 06/28/2018   NSTEMI (non-ST elevated myocardial infarction) (HCC) 06/16/2018   Peripheral arterial disease (HCC) 06/25/2016   Aortic atherosclerosis (HCC)    Coronary artery calcification seen on CAT scan 05/17/2016   Claudication (HCC) 05/17/2016   Heart murmur 05/17/2016   Chest pain 12/25/2012   Essential hypertension    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   Polyneuropathy in diabetes(357.2) 12/22/2012   Plantar fasciitis, bilateral 12/22/2012   Mixed hyperlipidemia 12/22/2012   Osteoporosis, unspecified 12/22/2012   Vitamin D deficiency 12/22/2012   Restless legs syndrome (RLS) 12/22/2012   Anxiety 12/22/2012   Sleep disturbance 12/22/2012   Tobacco use disorder 12/22/2012   Alcohol dependence (HCC) 12/22/2012    Past Surgical History:  Procedure Laterality Date   ABDOMINAL HYSTERECTOMY     CORONARY STENT INTERVENTION N/A 06/20/2018   Procedure: CORONARY STENT INTERVENTION;  Surgeon: Tonny Bollman, MD;  Location: [facility] INVASIVE CV LAB;  Service: Cardiovascular;  Laterality: N/A;   KNEE SURGERY Right 2012   LEFT HEART CATH AND CORONARY ANGIOGRAPHY N/A 06/20/2018   Procedure: LEFT HEART CATH AND CORONARY ANGIOGRAPHY;  Surgeon: Tonny Bollman, MD;  Location: [facility] INVASIVE CV LAB;  Service: Cardiovascular;  Laterality: N/A;     OB History   No obstetric history on file.     Family History  Problem Relation Age of Onset   Heart attack Mother    Heart disease Mother    AAA (abdominal aortic aneurysm) Father    Cancer Brother    Hypertension Sister    Diabetes Brother     Social History   Tobacco Use   Smoking status: Every Day    Packs/day: 1.00    Years: 88.00    Pack years: 88.00    Types: Cigarettes   Smokeless tobacco: Never   Tobacco comments:    Currently smoker  1ppd  Substance Use Topics   Alcohol use: Yes    Comment: 1-2 glasses of Wine per day   Drug use: No    Home Medications Prior to Admission medications   Medication Sig Start Date End Date Taking? Authorizing Provider  aspirin EC 81 MG tablet Take 81 mg by mouth daily.    [provider]  atorvastatin (LIPITOR) 80 MG tablet TAKE 1 TABLET BY MOUTH EVERY DAY AT 6PM 11/15/18   Quintella Reichert, MD  cetirizine (ZYRTEC) 10 MG tablet Take 10 mg by mouth every morning.    [provider]  cholecalciferol (VITAMIN D3) 25 MCG (1000 UT) tablet Take 2,000 Units by mouth daily.    [provider]  cycloSPORINE (RESTASIS) 0.05 % ophthalmic emulsion Place 1 drop into both eyes 2 (two) times daily.    [provider]  DULoxetine (CYMBALTA) 60 MG capsule Take 60 mg by mouth daily. 10/30/18   [provider]  HUMALOG KWIKPEN 100 UNIT/ML KwikPen Inject 12 Units into the skin in the morning and at bedtime. 06/13/19   [provider]  insulin glargine (LANTUS SOLOSTAR) 100 UNIT/ML Solostar Pen Inject 30 Units into the skin 2 (two) times daily.    [provider]  lisinopril (ZESTRIL) 20 MG tablet Take 1 tablet (20 mg total) by mouth daily. 07/12/18 09/17/19  Quintella Reichert, MD  lisinopril (ZESTRIL) 5 MG tablet  10/26/19   [provider]  metoprolol tartrate (LOPRESSOR) 50 MG tablet TAKE 1 TABLET BY MOUTH TWICE A DAY 09/18/19   Turner, Cornelious Bryant, MD  nicotine (NICODERM CQ) 21 mg/24hr patch Place 1 patch (21 mg total) onto the skin daily. 06/28/18   Dyann Kief, PA-C  nitroGLYCERIN (NITROSTAT) 0.4 MG SL tablet PLACE 1 TABLET UNDER THE TONGUE EVERY FOR 3 DOSES AS NEEDED FOR CHEST PAIN 08/22/20   Quintella Reichert, MD  omeprazole (PRILOSEC) 20 MG capsule Take 20 mg by mouth daily.    [provider]  thiamine (VITAMIN B-1) 100 MG tablet Take 100 mg by mouth daily.    [provider]  ticagrelor (BRILINTA) 60 MG TABS tablet Take  1 tablet (60 mg total) by mouth 2 (two) times daily. 09/17/19   Quintella Reichert, MD    Allergies    Penicillins  Review of Systems   Review of Systems  Respiratory:  Positive for shortness of breath. Negative for cough.   All other systems reviewed and are negative.  Physical Exam Updated Vital Signs BP (!) 220/177   Pulse 81   Temp 97.6 F (36.4 C) (Oral)   Resp 18   Ht 1.549 m (5\' 1" )   Wt 70.8 kg   SpO2 98%   BMI 29.48 kg/m   Physical Exam CONSTITUTIONAL: Elderly, uncomfortable appearing HEAD: Normocephalic/atraumatic EYES:  EOMI/PERRL ENMT: Mucous membranes moist NECK: supple no meningeal signs SPINE/BACK:entire spine nontender CV: S1/S2 noted, no murmurs/rubs/gallops noted LUNGS: Lungs are clear to auscultation bilaterally, no apparent distress ABDOMEN: soft, diffuse mild tenderness, no rebound or guarding, bowel sounds noted throughout abdomen GU:no cva tenderness NEURO: Pt is awake/alert/appropriate, moves all extremitiesx4.  No facial droop.  No arm or leg drift.  Equal handgrips EXTREMITIES: pulses normal/equal in all 4 extremities extremities, full ROM SKIN: Diaphoretic PSYCH: no abnormalities of mood noted, alert and oriented to situation  ED Results / Procedures / Treatments   Labs (all labs ordered are listed, but only abnormal results are displayed) Labs Reviewed  CBC WITH DIFFERENTIAL/PLATELET - Abnormal; Notable for the following components:      Result Value   WBC 14.1 (*)    Neutro Abs 11.6 (*)    Monocytes Absolute 1.3 (*)    Abs Immature Granulocytes 0.10 (*)    All other components within normal limits  COMPREHENSIVE METABOLIC PANEL - Abnormal; Notable for the following components:   Sodium 127 (*)    Chloride 94 (*)    CO2 21 (*)    Glucose, Bld 148 (*)    All other components within normal limits  I-STAT CHEM 8, ED - Abnormal; Notable for the following components:   Sodium 128 (*)    Chloride 94 (*)    Glucose, Bld 145 (*)    Calcium,  Ion 1.09 (*)    All other components within normal limits  RESP PANEL BY RT-PCR (FLU A&B, COVID) ARPGX2  LIPASE, BLOOD  PROTIME-INR  APTT  URINALYSIS, ROUTINE W REFLEX MICROSCOPIC  TROPONIN I (HIGH SENSITIVITY)  TROPONIN I (HIGH SENSITIVITY)    EKG EKG Interpretation  Date/Time:  Sunday September 07 2020 04:44:19 EDT Ventricular Rate:  80 PR Interval:  162 QRS Duration: 86 QT Interval:  385 QTC Calculation: 445 R Axis:   43 Text Interpretation: Sinus rhythm Abnormal R-wave progression, early transition Abnrm T, consider ischemia, anterolateral lds Baseline wander in lead(s) V3 Confirmed by Zadie Rhine ([id number]) on 09/07/2020 4:45:56 AM  Radiology DG Chest Port 1 View  Result Date: 09/07/2020 CLINICAL DATA:  75 year old female with chest pain. EXAM: PORTABLE CHEST 1 VIEW COMPARISON:  Chest CT 03/23/2019 and earlier. FINDINGS: Portable AP upright view at 0505 hours. Calcified aortic atherosclerosis. Lung volumes and mediastinal contours are within normal limits when allowing for portable technique. Visualized tracheal air column is within normal limits. Allowing for portable technique the lungs are clear. No pneumothorax or pleural effusion. No acute osseous abnormality identified. IMPRESSION: No acute cardiopulmonary abnormality. Aortic Atherosclerosis (ICD10-I70.0). Electronically Signed   By: Odessa Fleming M.D.   On: 09/07/2020 05:36   CT Angio Chest/Abd/Pel for Dissection W and/or Wo Contrast  Result Date: 09/07/2020 CLINICAL DATA:  75 year old female with abdominal pain and chest pain. EXAM: CT ANGIOGRAPHY CHEST, ABDOMEN AND PELVIS TECHNIQUE: Portable chest 0505 hours today. Multidetector CT imaging through the chest, abdomen and pelvis was performed using the standard protocol during bolus administration of intravenous contrast. Multiplanar reconstructed images and MIPs were obtained and reviewed to evaluate the vascular anatomy. CONTRAST:  31mL OMNIPAQUE IOHEXOL 350 MG/ML SOLN COMPARISON:   Noncontrast chest CT 03/23/2019. FINDINGS: CTA CHEST FINDINGS Cardiovascular: Extensive thoracic aortic calcified atherosclerosis. Similar coronary artery calcified plaque. No mural hematoma. No thoracic aortic aneurysm or dissection. Proximal great vessel atherosclerosis. High-grade stenosis or occlusion of the right ICA origin is visible on series 6, image 1. Bulky atherosclerosis there on a 2017 cervical  spine CT. Cardiac size within normal limits. No pericardial effusion. Central pulmonary arteries also well opacified and appear to be patent. Mediastinum/Nodes: Negative. Lungs/Pleura: Stable lung volumes. Major airways are patent. No pleural effusion. Lungs appear stable since last year, with scattered areas of peribronchial postinflammatory appearing scarring, more macroscopic scarring in the lingula. Mild dependent atelectasis. Musculoskeletal: Chronic thoracic disc and endplate degeneration. No acute osseous abnormality identified. Review of the MIP images confirms the above findings. CTA ABDOMEN AND PELVIS FINDINGS VASCULAR Extensive Aortoiliac calcified atherosclerosis. Negative for abdominal aortic aneurysm or dissection. Major arterial structures in the abdomen and pelvis remain patent. Abundant bilateral femoral artery atherosclerosis, visible proximal femoral arteries remain patent. Review of the MIP images confirms the above findings. NON-VASCULAR Hepatobiliary: 2.2 cm gallstone in the neck of the gallbladder with mildly thickened and indistinct appearance of some of the gallbladder wall (coronal image 53). But no convincing pericholecystic inflammation. Hepatic steatosis. Pancreas: Negative. Spleen: Negative. Adrenals/Urinary Tract: Distended urinary bladder (400 mL). Normal adrenal glands. Kidneys appears symmetric and normal. No hydronephrosis or hydroureter. Stomach/Bowel: Small gastric hiatal hernia is chronic. Diverticulosis of the descending and sigmoid colon without active inflammation.  Otherwise negative. Normal appendix. No dilated bowel. Lymphatic: No lymphadenopathy. Reproductive: Absent uterus.  Ovaries are within normal limits. Other: No pelvic free fluid. Musculoskeletal: Lower lumbar facet arthropathy. No acute osseous abnormality identified. Review of the MIP images confirms the above findings. IMPRESSION: 1. Great vessel atherosclerosis including bulky plaque at the right carotid bifurcation and possible Right ICA Occlusion. In the absence of acute neurologic symptoms a follow-up Carotid Doppler Ultrasound may be the simplest way to evaluate further. Otherwise CTA neck with IV contrast would further characterize. 2. Extensive Aortic Atherosclerosis (ICD10-I70.0). Negative for aneurysm or dissection. 3. Gallbladder wall thickening and a 2.2 cm gallstone in the neck of the gallbladder. But no convincing pericholecystic inflammation. If there is right abdominal pain then follow-up Ultrasound would be valuable. 4. Distended urinary bladder (400 mL). Stable lung scarring. Hepatic steatosis. Small gastric hiatal hernia. Diverticulosis of the descending and sigmoid colon without active inflammation. Electronically Signed   By: Odessa Fleming M.D.   On: 09/07/2020 06:14    Procedures Procedures   Medications Ordered in ED Medications  fentaNYL (SUBLIMAZE) injection 100 mcg (100 mcg Intravenous Given 09/07/20 0502)  ondansetron (ZOFRAN) injection 4 mg (4 mg Intravenous Given 09/07/20 0501)  iohexol (OMNIPAQUE) 350 MG/ML injection 75 mL (75 mLs Intravenous Contrast Given 09/07/20 0551)    ED Course  I have reviewed the triage vital signs and the nursing notes.  Pertinent labs & imaging results that were available during my care of the patient were reviewed by me and considered in my medical decision making (see chart for details).    MDM Rules/Calculators/A&P                           Patient seen on arrival. She reports onset of ripping back pain and now having chest and abdominal  pain.  Patient appears uncomfortable and is diaphoretic. She has a history of peripheral vascular disease as well as CAD. Differential diagnosis includes aortic dissection, but also includes ACS as well as intra-abdominal emergency. Labs are pending at this time, will proceed with CT angio chest/abdomen/pelvis.  We will follow closely 5:25 AM Patient reports feeling somewhat improved, but still hypertensive and has been mildly hypoxic CT dissection study is pending at this time 6:34 AM Overall patient is improved.  She is  resting comfortably.  CT scan was negative for dissection.  She does have cholelithiasis.  She also has signs of carotid atherosclerosis.  She denies any recent weakness/numbness or any visual changes.  She reports she is already aware of her carotid disease I advised the patient would likely benefit from admission given the severity of her symptoms prior to arrival.  Patient is hesitant to be admitted, but I advised her that I cannot rule out a cardiac event. Initial plan will be to monitor in ED, repeat troponin, will also add on right upper quadrant ultrasound to evaluate for any signs of cholecystitis  7:01 AM Signed out to Dr Jacqulyn BathLong at shift change to f/u on US imaging and labs Final Clinical Impression(s) / ED Diagnoses Final diagnoses:  RUQ pain  Precordial pain  Carotid atherosclerosis, unspecified laterality    Rx / DC Orders ED Discharge Orders     None        Zadie RhineWickline, Addilynn Mowrer, MD 09/07/20 [phone]

## 2020-09-07 NOTE — ED Notes (Signed)
Pt placed on 2L [place] due to o2 saturations at 85%.

## 2020-09-09 ENCOUNTER — Emergency Department (HOSPITAL_COMMUNITY): Payer: Medicare PPO

## 2020-09-09 ENCOUNTER — Other Ambulatory Visit: Payer: Self-pay

## 2020-09-09 ENCOUNTER — Inpatient Hospital Stay (HOSPITAL_COMMUNITY)
Admission: EM | Admit: 2020-09-09 | Discharge: 2020-09-14 | DRG: 418 | Disposition: A | Discharge status: Home / Self Care | Payer: Medicare PPO | Attending: Family Medicine | Admitting: Family Medicine

## 2020-09-09 ENCOUNTER — Encounter (HOSPITAL_COMMUNITY): Payer: Self-pay

## 2020-09-09 DIAGNOSIS — E1351 Other specified diabetes mellitus with diabetic peripheral angiopathy without gangrene: Secondary | ICD-10-CM | POA: Diagnosis not present

## 2020-09-09 DIAGNOSIS — Z20822 Contact with and (suspected) exposure to covid-19: Secondary | ICD-10-CM | POA: Diagnosis present

## 2020-09-09 DIAGNOSIS — Z833 Family history of diabetes mellitus: Secondary | ICD-10-CM

## 2020-09-09 DIAGNOSIS — K8020 Calculus of gallbladder without cholecystitis without obstruction: Secondary | ICD-10-CM

## 2020-09-09 DIAGNOSIS — R1010 Upper abdominal pain, unspecified: Secondary | ICD-10-CM

## 2020-09-09 DIAGNOSIS — E1169 Type 2 diabetes mellitus with other specified complication: Secondary | ICD-10-CM | POA: Diagnosis not present

## 2020-09-09 DIAGNOSIS — I10 Essential (primary) hypertension: Secondary | ICD-10-CM | POA: Diagnosis not present

## 2020-09-09 DIAGNOSIS — E559 Vitamin D deficiency, unspecified: Secondary | ICD-10-CM | POA: Diagnosis present

## 2020-09-09 DIAGNOSIS — E785 Hyperlipidemia, unspecified: Secondary | ICD-10-CM | POA: Diagnosis present

## 2020-09-09 DIAGNOSIS — I6529 Occlusion and stenosis of unspecified carotid artery: Secondary | ICD-10-CM | POA: Diagnosis present

## 2020-09-09 DIAGNOSIS — R079 Chest pain, unspecified: Secondary | ICD-10-CM | POA: Diagnosis not present

## 2020-09-09 DIAGNOSIS — K810 Acute cholecystitis: Secondary | ICD-10-CM | POA: Diagnosis present

## 2020-09-09 DIAGNOSIS — Z8249 Family history of ischemic heart disease and other diseases of the circulatory system: Secondary | ICD-10-CM

## 2020-09-09 DIAGNOSIS — E871 Hypo-osmolality and hyponatremia: Secondary | ICD-10-CM | POA: Diagnosis present

## 2020-09-09 DIAGNOSIS — I252 Old myocardial infarction: Secondary | ICD-10-CM

## 2020-09-09 DIAGNOSIS — I700 Atherosclerosis of aorta: Secondary | ICD-10-CM | POA: Diagnosis present

## 2020-09-09 DIAGNOSIS — Z794 Long term (current) use of insulin: Secondary | ICD-10-CM

## 2020-09-09 DIAGNOSIS — I6523 Occlusion and stenosis of bilateral carotid arteries: Secondary | ICD-10-CM | POA: Diagnosis not present

## 2020-09-09 DIAGNOSIS — R1084 Generalized abdominal pain: Secondary | ICD-10-CM | POA: Diagnosis not present

## 2020-09-09 DIAGNOSIS — K8001 Calculus of gallbladder with acute cholecystitis with obstruction: Principal | ICD-10-CM | POA: Diagnosis present

## 2020-09-09 DIAGNOSIS — K82A1 Gangrene of gallbladder in cholecystitis: Secondary | ICD-10-CM | POA: Diagnosis present

## 2020-09-09 DIAGNOSIS — E782 Mixed hyperlipidemia: Secondary | ICD-10-CM | POA: Diagnosis present

## 2020-09-09 DIAGNOSIS — K449 Diaphragmatic hernia without obstruction or gangrene: Secondary | ICD-10-CM | POA: Diagnosis present

## 2020-09-09 DIAGNOSIS — E1165 Type 2 diabetes mellitus with hyperglycemia: Secondary | ICD-10-CM | POA: Diagnosis not present

## 2020-09-09 DIAGNOSIS — I214 Non-ST elevation (NSTEMI) myocardial infarction: Secondary | ICD-10-CM | POA: Diagnosis not present

## 2020-09-09 DIAGNOSIS — N390 Urinary tract infection, site not specified: Secondary | ICD-10-CM | POA: Diagnosis present

## 2020-09-09 DIAGNOSIS — I2584 Coronary atherosclerosis due to calcified coronary lesion: Secondary | ICD-10-CM | POA: Diagnosis not present

## 2020-09-09 DIAGNOSIS — R319 Hematuria, unspecified: Secondary | ICD-10-CM | POA: Diagnosis not present

## 2020-09-09 DIAGNOSIS — I2510 Atherosclerotic heart disease of native coronary artery without angina pectoris: Secondary | ICD-10-CM | POA: Diagnosis present

## 2020-09-09 DIAGNOSIS — M810 Age-related osteoporosis without current pathological fracture: Secondary | ICD-10-CM | POA: Diagnosis not present

## 2020-09-09 DIAGNOSIS — E7800 Pure hypercholesterolemia, unspecified: Secondary | ICD-10-CM

## 2020-09-09 DIAGNOSIS — R109 Unspecified abdominal pain: Secondary | ICD-10-CM | POA: Diagnosis present

## 2020-09-09 DIAGNOSIS — Z6828 Body mass index (BMI) 28.0-28.9, adult: Secondary | ICD-10-CM

## 2020-09-09 DIAGNOSIS — I160 Hypertensive urgency: Secondary | ICD-10-CM

## 2020-09-09 DIAGNOSIS — E669 Obesity, unspecified: Secondary | ICD-10-CM | POA: Diagnosis present

## 2020-09-09 DIAGNOSIS — G2581 Restless legs syndrome: Secondary | ICD-10-CM | POA: Diagnosis present

## 2020-09-09 DIAGNOSIS — I739 Peripheral vascular disease, unspecified: Secondary | ICD-10-CM

## 2020-09-09 DIAGNOSIS — K219 Gastro-esophageal reflux disease without esophagitis: Secondary | ICD-10-CM | POA: Diagnosis present

## 2020-09-09 DIAGNOSIS — Z809 Family history of malignant neoplasm, unspecified: Secondary | ICD-10-CM

## 2020-09-09 DIAGNOSIS — F17210 Nicotine dependence, cigarettes, uncomplicated: Secondary | ICD-10-CM | POA: Diagnosis present

## 2020-09-09 DIAGNOSIS — E1142 Type 2 diabetes mellitus with diabetic polyneuropathy: Secondary | ICD-10-CM | POA: Diagnosis not present

## 2020-09-09 DIAGNOSIS — E860 Dehydration: Secondary | ICD-10-CM | POA: Diagnosis present

## 2020-09-09 DIAGNOSIS — E1149 Type 2 diabetes mellitus with other diabetic neurological complication: Secondary | ICD-10-CM | POA: Diagnosis present

## 2020-09-09 DIAGNOSIS — Z7982 Long term (current) use of aspirin: Secondary | ICD-10-CM

## 2020-09-09 DIAGNOSIS — K760 Fatty (change of) liver, not elsewhere classified: Secondary | ICD-10-CM | POA: Diagnosis present

## 2020-09-09 DIAGNOSIS — E119 Type 2 diabetes mellitus without complications: Secondary | ICD-10-CM | POA: Diagnosis not present

## 2020-09-09 DIAGNOSIS — Z880 Allergy status to penicillin: Secondary | ICD-10-CM

## 2020-09-09 DIAGNOSIS — Z90710 Acquired absence of both cervix and uterus: Secondary | ICD-10-CM

## 2020-09-09 DIAGNOSIS — Z79899 Other long term (current) drug therapy: Secondary | ICD-10-CM

## 2020-09-09 DIAGNOSIS — E1151 Type 2 diabetes mellitus with diabetic peripheral angiopathy without gangrene: Secondary | ICD-10-CM | POA: Diagnosis present

## 2020-09-09 DIAGNOSIS — E1140 Type 2 diabetes mellitus with diabetic neuropathy, unspecified: Secondary | ICD-10-CM | POA: Diagnosis present

## 2020-09-09 DIAGNOSIS — Z7902 Long term (current) use of antithrombotics/antiplatelets: Secondary | ICD-10-CM

## 2020-09-09 DIAGNOSIS — Z955 Presence of coronary angioplasty implant and graft: Secondary | ICD-10-CM

## 2020-09-09 DIAGNOSIS — E876 Hypokalemia: Secondary | ICD-10-CM | POA: Diagnosis not present

## 2020-09-09 DIAGNOSIS — E878 Other disorders of electrolyte and fluid balance, not elsewhere classified: Secondary | ICD-10-CM | POA: Diagnosis not present

## 2020-09-09 LAB — HEMOGLOBIN A1C
Hgb A1c MFr Bld: 8.7 % — ABNORMAL HIGH (ref 4.8–5.6)
Mean Plasma Glucose: 202.99 mg/dL

## 2020-09-09 LAB — LACTIC ACID, PLASMA
Lactic Acid, Venous: 1 mmol/L (ref 0.5–1.9)
Lactic Acid, Venous: 0.7 mmol/L (ref 0.5–1.9)

## 2020-09-09 LAB — RESP PANEL BY RT-PCR (FLU A&B, COVID) ARPGX2
Influenza A by PCR: NEGATIVE
Influenza B by PCR: NEGATIVE
SARS Coronavirus 2 by RT PCR: NEGATIVE

## 2020-09-09 LAB — COMPREHENSIVE METABOLIC PANEL
ALT: 26 U/L (ref 0–44)
AST: 17 U/L (ref 15–41)
Albumin: 3.4 g/dL — ABNORMAL LOW (ref 3.5–5.0)
Alkaline Phosphatase: 75 U/L (ref 38–126)
Anion gap: 13 (ref 5–15)
BUN: 9 mg/dL (ref 8–23)
CO2: 22 mmol/L (ref 22–32)
Calcium: 8.9 mg/dL (ref 8.9–10.3)
Chloride: 89 mmol/L — ABNORMAL LOW (ref 98–111)
Creatinine, Ser: 0.66 mg/dL (ref 0.44–1.00)
GFR, Estimated: >60 mL/min (ref >60)
Glucose, Bld: 178 mg/dL — ABNORMAL HIGH (ref 70–99)
Potassium: 3.5 mmol/L (ref 3.5–5.1)
Sodium: 124 mmol/L — ABNORMAL LOW (ref 135–145)
Total Bilirubin: 1.4 mg/dL — ABNORMAL HIGH (ref 0.3–1.2)
Total Protein: 7.2 g/dL (ref 6.5–8.1)

## 2020-09-09 LAB — CBC WITH DIFFERENTIAL/PLATELET
Abs Immature Granulocytes: 0 10*3/uL (ref 0.00–0.07)
Basophils Absolute: 0 10*3/uL (ref 0.0–0.1)
Basophils Relative: 0 %
Eosinophils Absolute: 0 10*3/uL (ref 0.0–0.5)
Eosinophils Relative: 0 %
HCT: 40.2 % (ref 36.0–46.0)
Hemoglobin: 14.1 g/dL (ref 12.0–15.0)
Lymphocytes Relative: 1 %
Lymphs Abs: 0.3 10*3/uL — ABNORMAL LOW (ref 0.7–4.0)
MCH: 33 pg (ref 26.0–34.0)
MCHC: 35.1 g/dL (ref 30.0–36.0)
MCV: 94.1 fL (ref 80.0–100.0)
Monocytes Absolute: 1.6 10*3/uL — ABNORMAL HIGH (ref 0.1–1.0)
Monocytes Relative: 6 %
Neutro Abs: 25.2 10*3/uL — ABNORMAL HIGH (ref 1.7–7.7)
Neutrophils Relative %: 93 %
Platelets: 248 10*3/uL (ref 150–400)
RBC: 4.27 MIL/uL (ref 3.87–5.11)
RDW: 12.9 % (ref 11.5–15.5)
WBC: 27.1 10*3/uL — ABNORMAL HIGH (ref 4.0–10.5)
nRBC: 0 % (ref 0.0–0.2)

## 2020-09-09 LAB — URINALYSIS, ROUTINE W REFLEX MICROSCOPIC
Bilirubin Urine: NEGATIVE
Glucose, UA: 150 mg/dL — AB
Ketones, ur: 80 mg/dL — AB
Nitrite: NEGATIVE
Protein, ur: 30 mg/dL — AB
Specific Gravity, Urine: 1.016 (ref 1.005–1.030)
pH: 7 (ref 5.0–8.0)

## 2020-09-09 LAB — TROPONIN I (HIGH SENSITIVITY)
Troponin I (High Sensitivity): 21 ng/L — ABNORMAL HIGH (ref <18)
Troponin I (High Sensitivity): 19 ng/L — ABNORMAL HIGH (ref <18)

## 2020-09-09 LAB — CBC
HCT: 37.3 % (ref 36.0–46.0)
Hemoglobin: 13.1 g/dL (ref 12.0–15.0)
MCH: 33.2 pg (ref 26.0–34.0)
MCHC: 35.1 g/dL (ref 30.0–36.0)
MCV: 94.7 fL (ref 80.0–100.0)
Platelets: 220 10*3/uL (ref 150–400)
RBC: 3.94 MIL/uL (ref 3.87–5.11)
RDW: 13 % (ref 11.5–15.5)
WBC: 26.4 10*3/uL — ABNORMAL HIGH (ref 4.0–10.5)
nRBC: 0 % (ref 0.0–0.2)

## 2020-09-09 LAB — BASIC METABOLIC PANEL
Anion gap: 12 (ref 5–15)
BUN: 8 mg/dL (ref 8–23)
CO2: 21 mmol/L — ABNORMAL LOW (ref 22–32)
Calcium: 8.2 mg/dL — ABNORMAL LOW (ref 8.9–10.3)
Chloride: 92 mmol/L — ABNORMAL LOW (ref 98–111)
Creatinine, Ser: 0.6 mg/dL (ref 0.44–1.00)
GFR, Estimated: >60 mL/min (ref >60)
Glucose, Bld: 157 mg/dL — ABNORMAL HIGH (ref 70–99)
Potassium: 3.5 mmol/L (ref 3.5–5.1)
Sodium: 125 mmol/L — ABNORMAL LOW (ref 135–145)

## 2020-09-09 LAB — CBG MONITORING, ED: Glucose-Capillary: 157 mg/dL — ABNORMAL HIGH (ref 70–99)

## 2020-09-09 LAB — LIPASE, BLOOD: Lipase: 27 U/L (ref 11–51)

## 2020-09-09 MED ORDER — THIAMINE HCL 100 MG PO TABS
100.0000 mg | ORAL_TABLET | Freq: Every day | ORAL | Status: DC
  Administered 2020-09-10 – 2020-09-14 (×5): 100 mg via ORAL
  Filled 2020-09-09 (×5): qty 1

## 2020-09-09 MED ORDER — ONDANSETRON HCL 4 MG PO TABS: 4.0000 mg | ORAL_TABLET | Freq: Four times a day (QID) | ORAL | Status: DC | PRN

## 2020-09-09 MED ORDER — METRONIDAZOLE 500 MG/100ML IV SOLN
500.0000 mg | Freq: Once | INTRAVENOUS | Status: AC
  Administered 2020-09-09: 500 mg via INTRAVENOUS
  Filled 2020-09-09: qty 100

## 2020-09-09 MED ORDER — LISINOPRIL 5 MG PO TABS
5.0000 mg | ORAL_TABLET | Freq: Every day | ORAL | Status: DC
  Administered 2020-09-10 – 2020-09-14 (×5): 5 mg via ORAL
  Filled 2020-09-09 (×5): qty 1

## 2020-09-09 MED ORDER — MORPHINE SULFATE (PF) 4 MG/ML IV SOLN
4.0000 mg | Freq: Once | INTRAVENOUS | Status: AC
  Administered 2020-09-09: 4 mg via INTRAVENOUS
  Filled 2020-09-09: qty 1

## 2020-09-09 MED ORDER — ASPIRIN EC 81 MG PO TBEC
81.0000 mg | DELAYED_RELEASE_TABLET | Freq: Every day | ORAL | Status: DC
  Administered 2020-09-10 – 2020-09-14 (×5): 81 mg via ORAL
  Filled 2020-09-09 (×5): qty 1

## 2020-09-09 MED ORDER — METOPROLOL TARTRATE 50 MG PO TABS
50.0000 mg | ORAL_TABLET | Freq: Two times a day (BID) | ORAL | Status: DC
  Administered 2020-09-10 – 2020-09-14 (×9): 50 mg via ORAL
  Filled 2020-09-09 (×9): qty 1

## 2020-09-09 MED ORDER — LACTATED RINGERS IV SOLN: INTRAVENOUS | Status: DC

## 2020-09-09 MED ORDER — ACETAMINOPHEN 325 MG PO TABS
650.0000 mg | ORAL_TABLET | Freq: Four times a day (QID) | ORAL | Status: DC | PRN
  Administered 2020-09-11: 650 mg via ORAL
  Filled 2020-09-09: qty 2

## 2020-09-09 MED ORDER — NITROGLYCERIN 0.4 MG SL SUBL
0.4000 mg | SUBLINGUAL_TABLET | SUBLINGUAL | Status: DC | PRN
  Administered 2020-09-09 (×2): 0.4 mg via SUBLINGUAL
  Filled 2020-09-09 (×2): qty 1

## 2020-09-09 MED ORDER — LORATADINE 10 MG PO TABS
10.0000 mg | ORAL_TABLET | Freq: Every day | ORAL | Status: DC
  Administered 2020-09-10 – 2020-09-14 (×5): 10 mg via ORAL
  Filled 2020-09-09 (×5): qty 1

## 2020-09-09 MED ORDER — INSULIN ASPART 100 UNIT/ML IJ SOLN
0.0000 [IU] | Freq: Three times a day (TID) | INTRAMUSCULAR | Status: DC
  Administered 2020-09-10 (×2): 5 [IU] via SUBCUTANEOUS
  Administered 2020-09-11: 3 [IU] via SUBCUTANEOUS
  Administered 2020-09-11: 2 [IU] via SUBCUTANEOUS
  Administered 2020-09-11: 5 [IU] via SUBCUTANEOUS
  Administered 2020-09-12: 2 [IU] via SUBCUTANEOUS
  Administered 2020-09-12: 5 [IU] via SUBCUTANEOUS
  Administered 2020-09-12: 3 [IU] via SUBCUTANEOUS
  Administered 2020-09-13 – 2020-09-14 (×3): 15 [IU] via SUBCUTANEOUS

## 2020-09-09 MED ORDER — ATORVASTATIN CALCIUM 80 MG PO TABS
80.0000 mg | ORAL_TABLET | Freq: Every day | ORAL | Status: DC
  Administered 2020-09-10 – 2020-09-13 (×4): 80 mg via ORAL
  Filled 2020-09-09 (×4): qty 1

## 2020-09-09 MED ORDER — PANTOPRAZOLE SODIUM 40 MG IV SOLR
40.0000 mg | Freq: Two times a day (BID) | INTRAVENOUS | Status: DC
  Administered 2020-09-10 – 2020-09-14 (×10): 40 mg via INTRAVENOUS
  Filled 2020-09-09 (×10): qty 40

## 2020-09-09 MED ORDER — VITAMIN D 25 MCG (1000 UNIT) PO TABS
2000.0000 [IU] | ORAL_TABLET | Freq: Every day | ORAL | Status: DC
  Administered 2020-09-10 – 2020-09-14 (×5): 2000 [IU] via ORAL
  Filled 2020-09-09 (×5): qty 2

## 2020-09-09 MED ORDER — ONDANSETRON HCL 4 MG/2ML IJ SOLN: 4.0000 mg | Freq: Four times a day (QID) | INTRAMUSCULAR | Status: DC | PRN

## 2020-09-09 MED ORDER — CIPROFLOXACIN IN D5W 400 MG/200ML IV SOLN
400.0000 mg | Freq: Once | INTRAVENOUS | Status: AC
  Administered 2020-09-09: 400 mg via INTRAVENOUS
  Filled 2020-09-09: qty 200

## 2020-09-09 MED ORDER — METRONIDAZOLE 500 MG/100ML IV SOLN
500.0000 mg | Freq: Three times a day (TID) | INTRAVENOUS | Status: DC
  Administered 2020-09-10 – 2020-09-14 (×13): 500 mg via INTRAVENOUS
  Filled 2020-09-09 (×13): qty 100

## 2020-09-09 MED ORDER — INSULIN ASPART 100 UNIT/ML IJ SOLN
0.0000 [IU] | Freq: Every day | INTRAMUSCULAR | Status: DC
  Administered 2020-09-10: 2 [IU] via SUBCUTANEOUS
  Administered 2020-09-11: 3 [IU] via SUBCUTANEOUS
  Administered 2020-09-12: 2 [IU] via SUBCUTANEOUS

## 2020-09-09 MED ORDER — CYCLOSPORINE 0.05 % OP EMUL
1.0000 [drp] | Freq: Two times a day (BID) | OPHTHALMIC | Status: DC
  Administered 2020-09-13: 1 [drp] via OPHTHALMIC
  Filled 2020-09-09 (×11): qty 1

## 2020-09-09 MED ORDER — SODIUM CHLORIDE 0.9 % IV BOLUS
500.0000 mL | Freq: Once | INTRAVENOUS | Status: AC
  Administered 2020-09-09: 500 mL via INTRAVENOUS

## 2020-09-09 MED ORDER — ENOXAPARIN SODIUM 40 MG/0.4ML IJ SOSY
40.0000 mg | PREFILLED_SYRINGE | INTRAMUSCULAR | Status: DC
  Administered 2020-09-10 – 2020-09-13 (×2): 40 mg via SUBCUTANEOUS
  Filled 2020-09-09 (×4): qty 0.4

## 2020-09-09 MED ORDER — OXYCODONE HCL 5 MG PO TABS
5.0000 mg | ORAL_TABLET | ORAL | Status: DC | PRN
  Administered 2020-09-10 (×2): 5 mg via ORAL
  Filled 2020-09-09 (×2): qty 1

## 2020-09-09 MED ORDER — ACETAMINOPHEN 650 MG RE SUPP: 650.0000 mg | Freq: Four times a day (QID) | RECTAL | Status: DC | PRN

## 2020-09-09 MED ORDER — CIPROFLOXACIN IN D5W 400 MG/200ML IV SOLN
400.0000 mg | Freq: Two times a day (BID) | INTRAVENOUS | Status: DC
  Administered 2020-09-10 – 2020-09-14 (×9): 400 mg via INTRAVENOUS
  Filled 2020-09-09 (×9): qty 200

## 2020-09-09 MED ORDER — HYDRALAZINE HCL 20 MG/ML IJ SOLN: 10.0000 mg | Freq: Four times a day (QID) | INTRAMUSCULAR | Status: DC | PRN

## 2020-09-09 MED ORDER — FENTANYL CITRATE PF 50 MCG/ML IJ SOSY
12.5000 ug | PREFILLED_SYRINGE | INTRAMUSCULAR | Status: DC | PRN
  Administered 2020-09-09: 50 ug via INTRAVENOUS
  Filled 2020-09-09: qty 1

## 2020-09-09 NOTE — ED Notes (Signed)
Pt gone to CT 

## 2020-09-09 NOTE — H&P (Addendum)
History and Physical    DOA: 09/09/2020  PCP: Gweneth Dimitri, MD  Patient coming from: Home  Chief Complaint: Back/abdominal pain  HPI: Sarah Francis is a 75 y.o. female with history h/o diabetes, GERD, hyperlipidemia, hypertension, depression/anxiety, PAD, CAD s/p PTCA requiring 4 DES in May 2020 and been on aspirin/Brilinta since then, now presents with complaints of back pain -9/10 starting between her shoulder blades and radiating to left upper quadrant/epigastrium and mid abdomen.  Patient in fact presented with these complaints associated with nausea and vomiting on Sunday-was evaluated in the ED for atypical chest pain as well as abdominal pain and underwent CTA chest which was negative for PE, dissection.  Patient also CT abdomen/pelvis which showed gallstones but no evidence of cholecystitis.  Patient did not want to stay in the hospital and went home with instructions to follow-up with her primary cardiologist/PCP. Since being at home, patient states she did not have any further vomiting but remained nauseous with poor oral intake and slept most of Sunday night and Monday all day.  She ate a sandwich yesterday afternoon which she tolerated okay but otherwise did not have any other meals.  She had recurrence of pain between her shoulder blades overnight, radiating to abdomen similar to prior presentation-->prompted her visit to PCP today who called ambulance and sent patient to the ED.  Patient denies any history of melena or hematochezia or chest pain per se.  She denies any dyspnea at rest or on exertion.  She does however report longstanding nausea and abdominal discomfort whenever she takes Brilinta.  ED work-up: Afebrile, elevated SBP with systolic 150-211 in the setting of pain, WBC 27K (was 14 K on 8/14), hemoglobin 14, sodium 124 (was 127 on 8/14), K3.5, bicarb 22, BUN 9, creatinine 0.6. HS Troponin-21 and 19.  Total bili 1.4 UA showed bacteriuria and RBC 21-50 but no evidence of  pyuria (WBC 0-5), nitrite negative.  CT abdomen/pelvis on 8/14 showed: "Gallbladder wall thickening and a 2.2 cm gallstone in the neck of  the gallbladder. But no convincing pericholecystic inflammation.Distended urinary bladder (400 mL). Stable lung scarring. Hepatic steatosis. Small gastric hiatal hernia. Diverticulosis of the descending and sigmoid colon without active inflammation" Gallbladder ultrasonogram 8/14 revealed ". Gallstones, including the 2.2 cm gallstone in the neck of the gallbladder, but no strong evidence of acute cholecystitis at this time. No evidence of bile duct obstruction."  Repeat ultrasonogram today no change since Sunday. Review of Systems: As per HPI, otherwise review of systems negative.    Past Medical History:  Diagnosis Date   Anxiety    Aortic atherosclerosis (HCC)    CAD (coronary artery disease), native coronary artery    06/20/18 PCI/DES overlapping stents to p/m RCA, mild disease in LAD, and Lcx   Carotid artery stenosis    40-59% by dopplers 12/2018   Diabetes mellitus without complication (HCC)    GERD (gastroesophageal reflux disease)    HLD (hyperlipidemia)    Hypertension    Major depression    initial diagnosis/medication around age 31   Osteoporosis    w h/o sacral fracture   PAD (peripheral artery disease) (HCC)    high grade stenosis in mid left SFA - followed by Dr. Allyson Sabal   Syncope 12/2018   felt to be vasovagal    Past Surgical History:  Procedure Laterality Date   ABDOMINAL HYSTERECTOMY     CORONARY STENT INTERVENTION N/A 06/20/2018   Procedure: CORONARY STENT INTERVENTION;  Surgeon: Tonny Bollman, MD;  Location:  [facility];  Service: Cardiovascular;  Laterality: N/A;   KNEE SURGERY Right 2012   LEFT HEART CATH AND CORONARY ANGIOGRAPHY N/A 06/20/2018   Procedure: LEFT HEART CATH AND CORONARY ANGIOGRAPHY;  Surgeon: Tonny Bollman, MD;  Location: [facility];  Service: Cardiovascular;  Laterality: N/A;    Social  history:  reports that she has been smoking. She has a 88.00 pack-year smoking history. She has never used smokeless tobacco. She reports current alcohol use. She reports that she does not use drugs.   Allergies  Allergen Reactions   Penicillins Rash    Did it involve swelling of the face/tongue/throat, SOB, or low BP? Yes Did it involve sudden or severe rash/hives, skin peeling, or any reaction on the inside of your mouth or nose? Unknown Did you need to seek medical attention at a hospital or doctor's office? Unknown When did it last happen?    Pt was a child, had lip swelling   If all above answers are "NO", may proceed with cephalosporin use.     Family History  Problem Relation Age of Onset   Heart attack Mother    Heart disease Mother    AAA (abdominal aortic aneurysm) Father    Cancer Brother    Hypertension Sister    Diabetes Brother       Prior to Admission medications   Medication Sig Start Date End Date Taking? Authorizing Provider  thiamine (VITAMIN B-1) 100 MG tablet Take 100 mg by mouth daily.   Yes [provider]  aspirin EC 81 MG tablet Take 81 mg by mouth daily.    [provider]  atorvastatin (LIPITOR) 80 MG tablet TAKE 1 TABLET BY MOUTH EVERY DAY AT 6PM 11/15/18   Quintella Reichert, MD  cetirizine (ZYRTEC) 10 MG tablet Take 10 mg by mouth every morning.    [provider]  cholecalciferol (VITAMIN D3) 25 MCG (1000 UT) tablet Take 2,000 Units by mouth daily.    [provider]  cycloSPORINE (RESTASIS) 0.05 % ophthalmic emulsion Place 1 drop into both eyes 2 (two) times daily.    [provider]  DULoxetine (CYMBALTA) 60 MG capsule Take 60 mg by mouth daily. 10/30/18   [provider]  HUMALOG KWIKPEN 100 UNIT/ML KwikPen Inject 12 Units into the skin in the morning and at bedtime. 06/13/19   [provider]  insulin glargine (LANTUS SOLOSTAR) 100 UNIT/ML Solostar Pen Inject 30 Units into the skin 2  (two) times daily.    [provider]  lisinopril (ZESTRIL) 20 MG tablet Take 1 tablet (20 mg total) by mouth daily. 07/12/18 09/17/19  Quintella Reichert, MD  lisinopril (ZESTRIL) 5 MG tablet  10/26/19   [provider]  metoprolol tartrate (LOPRESSOR) 50 MG tablet TAKE 1 TABLET BY MOUTH TWICE A DAY 09/18/19   Turner, Cornelious Bryant, MD  nicotine (NICODERM CQ) 21 mg/24hr patch Place 1 patch (21 mg total) onto the skin daily. 06/28/18   Dyann Kief, PA-C  nitroGLYCERIN (NITROSTAT) 0.4 MG SL tablet PLACE 1 TABLET UNDER THE TONGUE EVERY FOR 3 DOSES AS NEEDED FOR CHEST PAIN 08/22/20   Quintella Reichert, MD  omeprazole (PRILOSEC) 20 MG capsule Take 20 mg by mouth daily.    [provider]  ticagrelor (BRILINTA) 60 MG TABS tablet Take 1 tablet (60 mg total) by mouth 2 (two) times daily. 09/17/19   Quintella Reichert, MD    Physical Exam: Vitals:   09/09/20  1400 09/09/20 1440 09/09/20 1554 09/09/20 1730  BP:  (!) 177/79 (!) 150/61 (!) 169/69  Pulse: 86 90 86 90  Resp: (!) 21 (!) 22 (!) 22 14  Temp:      TempSrc:      SpO2: 98% 98% 97% 97%  Weight:      Height:        Constitutional: Obese female laying in bed in mild distress due to pain, somewhat drowsy/somnolent due to pain medications. Eyes: PERRL, lids and conjunctivae normal ENMT: Mucous membranes are dry.  Posterior pharynx clear of any exudate or lesions.Normal dentition.  Neck: normal, supple, no masses, no thyromegaly Respiratory: clear to auscultation bilaterally, no wheezing, no crackles. Normal respiratory effort. No accessory muscle use.  Cardiovascular: Regular rate and rhythm, no murmurs / rubs / gallops. No extremity edema. 2+ pedal pulses. No carotid bruits.  Reproducible pain along last costochondral junction as well as epigastrium. Abdomen: Epigastric/left upper quadrant tenderness to deep palpation, Murphy negative, no guarding or rebound, bowel sounds heard Musculoskeletal: no clubbing / cyanosis. No joint  deformity upper and lower extremities. Good ROM, no contractures. Normal muscle tone.  Neurologic: CN 2-12 grossly intact. Sensation intact, DTR normal. Strength 5/5 in all 4.  Psychiatric: Normal judgment and insight. Alert and oriented x 3. Normal mood.  SKIN/catheters: no rashes, lesions, ulcers. No induration  Labs on Admission: I have personally reviewed following labs and imaging studies  CBC: Recent Labs  Lab 09/07/20 0447 09/07/20 0504 09/09/20 1210  WBC 14.1*  --  27.1*  NEUTROABS 11.6*  --  25.2*  HGB 13.9 13.6 14.1  HCT 40.7 40.0 40.2  MCV 97.6  --  94.1  PLT 270  --  248   Basic Metabolic Panel: Recent Labs  Lab 09/07/20 0447 09/07/20 0504 09/09/20 1210  NA 127* 128* 124*  K 4.3 4.2 3.5  CL 94* 94* 89*  CO2 21*  --  22  GLUCOSE 148* 145* 178*  BUN 13 15 9   CREATININE 0.69 0.60 0.66  CALCIUM 9.2  --  8.9   GFR: Estimated Creatinine Clearance: 57.5 mL/min (by C-G formula based on SCr of 0.66 mg/dL). Recent Labs  Lab 09/07/20 0447 09/09/20 1210 09/09/20 1558  WBC 14.1* 27.1*  --   LATICACIDVEN  --   --  1.0   Liver Function Tests: Recent Labs  Lab 09/07/20 0447 09/09/20 1210  AST 32 17  ALT 43 26  ALKPHOS 64 75  BILITOT 1.0 1.4*  PROT 7.0 7.2  ALBUMIN 4.0 3.4*   Recent Labs  Lab 09/07/20 0447 09/09/20 1210  LIPASE 29 27   No results for input(s): AMMONIA in the last 168 hours. Coagulation Profile: Recent Labs  Lab 09/07/20 0447  INR 0.9   Cardiac Enzymes: No results for input(s): CKTOTAL, CKMB, CKMBINDEX, TROPONINI in the last 168 hours. BNP (last 3 results) No results for input(s): PROBNP in the last 8760 hours. HbA1C: No results for input(s): HGBA1C in the last 72 hours. CBG: No results for input(s): GLUCAP in the last 168 hours. Lipid Profile: No results for input(s): CHOL, HDL, LDLCALC, TRIG, CHOLHDL, LDLDIRECT in the last 72 hours. Thyroid Function Tests: No results for input(s): TSH, T4TOTAL, FREET4, T3FREE, THYROIDAB in  the last 72 hours. Anemia Panel: No results for input(s): VITAMINB12, FOLATE, FERRITIN, TIBC, IRON, RETICCTPCT in the last 72 hours. Urine analysis:    Component Value Date/Time   COLORURINE YELLOW 09/09/2020 1403   APPEARANCEUR HAZY (A) 09/09/2020 1403   LABSPEC  1.016 09/09/2020 1403   PHURINE 7.0 09/09/2020 1403   GLUCOSEU 150 (A) 09/09/2020 1403   HGBUR SMALL (A) 09/09/2020 1403   BILIRUBINUR NEGATIVE 09/09/2020 1403   KETONESUR 80 (A) 09/09/2020 1403   PROTEINUR 30 (A) 09/09/2020 1403   NITRITE NEGATIVE 09/09/2020 1403   LEUKOCYTESUR TRACE (A) 09/09/2020 1403    Radiological Exams on Admission: Personally reviewed  DG Chest 2 View  Result Date: 09/09/2020 CLINICAL DATA:  Chest pain. EXAM: CHEST - 2 VIEW COMPARISON:  September 07, 2020. FINDINGS: The heart size and mediastinal contours are within normal limits. Both lungs are clear. The visualized skeletal structures are unremarkable. IMPRESSION: No active cardiopulmonary disease. Aortic Atherosclerosis (ICD10-I70.0). Electronically Signed   By: Lupita RaiderJames  Green Jr M.D.   On: 09/09/2020 13:20   US Abdomen Limited RUQ (LIVER/GB)  Result Date: 09/09/2020 CLINICAL DATA:  Chest pain abdominal pain since Sunday EXAM: ULTRASOUND ABDOMEN LIMITED RIGHT UPPER QUADRANT COMPARISON:  Ultrasound 09/07/2020 CT 09/07/2020 FINDINGS: Gallbladder: Gallbladder is nondistended. Several echogenic gallstones are present the lumen of the gallbladder including a 19 mm stone towards the neck of the gallbladder. No pericholecystic fluid. No gallbladder wall thickening. Negative sonographic Murphy's sign. Common bile duct: Diameter: Normal at 3 mm Liver: Liver has uniform increase in echogenicity. There are several hypoechoic regions along the gallbladder fossa most suggestive of focal fatty sparing. Portal vein is patent on color Doppler imaging with normal direction of blood flow towards the liver. Other: Ascites IMPRESSION: 1. No interval change from ultrasound  09/07/2020. 2. Cholelithiasis without acute cholecystitis. 3. Increased liver echogenicity suggests hepatic steatosis. No significant steatosis noted on comparison CT 09/07/2020. 4. Hypoechoic regions along the gallbladder fossa most suggestive focal fatty sparing. 5. With elevated bilirubin and elevated white blood cell count, consider contrast MRI for further evaluation. Electronically Signed   By: Genevive BiStewart  Edmunds M.D.   On: 09/09/2020 15:02    EKG: Independently reviewed.  Normal sinus rhythm with nonspecific T wave changes     Assessment and Plan:   Principal Problem:   Gallstones without obstruction of gallbladder Active Problems:   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HCC)   Mixed hyperlipidemia   Vitamin D deficiency   Restless legs syndrome (RLS)   Essential hypertension   Peripheral arterial disease (HCC)   CAD (coronary artery disease)   Carotid artery stenosis   PAD (peripheral artery disease) (HCC)   HLD (hyperlipidemia)   Abdominal pain    1.Abdominal/back pain: Secondary to symptomatic gallstones versus peptic ulcer disease most likely.  Given leukocytosis, elevated total bili->concern for intermittently obstructing cystic duct stone.  Patient started on empiric antibiotics in the ED and seen by general surgery who is planning on HIDA scan.  If negative, would consider GI evaluation for possible peptic ulcer disease given patient's report of intolerance with Brilinta intake.  Supportive management with n.p.o., IV fluids, antiemetics, analgesics.  Appreciate general surgery evaluation and follow-up. Will change p.o. PPI to IV PPI while here to help with dyspepsia although denies any melena or hematochezia. Doubt UTI as culprit without pyuria.  No evidence of suprapubic or CVA tenderness.  Will follow urine/blood cultures while on antibiotics.  Leukocytosis could be related to gallbladder disease versus reactive in the setting of recent vomiting.  2.  Atypical chest  pain: Appears to be reproducible at this time.  She denies any chest pain or dyspnea per se today although she did report chest discomfort on Sunday presentation.  EKG unremarkable and troponins downtrending.  Could be related  to elevated blood pressure and stress related in the setting of problem #1.  Patient follows Dr. Excell Seltzer as outpatient and last echo in 2020 showed stage II diastolic dysfunction.  Will repeat echo.  Consider cardiology evaluation if echo shows any significant wall motion abnormalities.  3.  Accelerated hypertension: In the setting of problem #1 and inability to tolerate p.o. meds in the last couple of days.  Resume home medications, added as needed hydralazine.  Treat underlying conditions including pain.  4.  CAD s/p PTCA: Patient last underwent cardiac cath with 4 stents placed in 2020.  Hold Brilinta for now per general surgery recommendations.  Resume in a.m. if no plan for surgery and patient able to tolerate p.o  5.  Diabetes mellitus with neuropathy: We will place on sliding scale insulin while NPO.  6.  Carotid artery stenosis: No acute TIA symptomatology.  Known finding per records.  Resume dual antiplatelet agents when able to tolerate.  7.  Acute hyponatremia: Likely secondary to poor oral intake and dehydration.  Cautious IV fluids in the setting of stage II diastolic dysfunction.  Repeat labs in AM.  Baseline sodium appears to be around 132-134.  8. Hyperlipidemia: Resume home medications.  9.  Vitamin D deficiency: Resume home meds  DVT prophylaxis: Lovenox  COVID screen: Pending  Code Status: Full code   .Health care proxy would be her daughter who lives next door  Patient/Family Communication: Discussed with patient and all questions answered to satisfaction.  Consults called: General surgery Admission status :Patient will be admitted under OBSERVATION status.The patient's presenting symptoms, physical exam findings, and initial radiographic and  laboratory data in the context of their medical condition is felt to place them at low risk for further clinical deterioration. Furthermore, it is anticipated that the patient will be medically stable for discharge from the hospital within 2 midnights of hospital stay.      Alessandra Bevels MD [facility] Pager in [place]  If 7PM-7AM, please contact night-coverage [email or web address]   09/09/2020, 6:31 PM

## 2020-09-09 NOTE — ED Triage Notes (Signed)
Pt bib ems from PCP; seen here for chest discomfort past Sunday am, negative trop, "abnormal ekg"; sinus rhythm w/ frequent runs PACs with ems; initial cp radiating from back to chest and pelvis that started last night, woke from sleep; pt endorses reduced urine output today, dark; while in route, pain changed to crushing pressure; no ekg changed noted; pt hypertensive 210/130; hx dm, stent placement; 20 ga lh; 324 asa given pta; pt endorses taking nitro earlier w/o relief; denies taking BP meds today

## 2020-09-09 NOTE — ED Provider Notes (Signed)
[facility] EMERGENCY DEPARTMENT Provider Note   CSN: [id number] Arrival date & time: 09/09/20  1140     History Chief Complaint  Patient presents with   Chest Pain    Ahonesty Woodfin is a 75 y.o. female.  HPI 75 year old female presents with chest and back pain.  She was seen here a couple days ago for the same type of pain.  After being discharged she states she had a couple good days but then last night her pain came back and has been present ever since around 11 PM.  Chest pain is in her left inferior chest in her mid axillary line as well as some thoracic back discomfort and pain going into her abdomen.  She was given 4 baby aspirin and feels a little bit better.  She has not had a fever, cough or trouble breathing but she has been nauseated. Pain is 8/10. Nothing makes it better or worse, including inspiration or movement (maybe some relief with lying flat). No dysuria but states she hasn't peed since 3 am.  Past Medical History:  Diagnosis Date   Anxiety    Aortic atherosclerosis (HCC)    CAD (coronary artery disease), native coronary artery    06/20/18 PCI/DES overlapping stents to p/m RCA, mild disease in LAD, and Lcx   Carotid artery stenosis    40-59% by dopplers 12/2018   Diabetes mellitus without complication (HCC)    GERD (gastroesophageal reflux disease)    HLD (hyperlipidemia)    Hypertension    Major depression    initial diagnosis/medication around age 42   Osteoporosis    w h/o sacral fracture   PAD (peripheral artery disease) (HCC)    high grade stenosis in mid left SFA - followed by Dr. Allyson Sabal   Syncope 12/2018   felt to be vasovagal    Patient Active Problem List   Diagnosis Date Noted   Carotid artery stenosis    PAD (peripheral artery disease) (HCC)    HLD (hyperlipidemia)    Syncope 12/2018   CAD (coronary artery disease) 06/28/2018   Family history of early CAD 06/28/2018   NSTEMI (non-ST elevated myocardial infarction) (HCC)  06/16/2018   Peripheral arterial disease (HCC) 06/25/2016   Aortic atherosclerosis (HCC)    Coronary artery calcification seen on CAT scan 05/17/2016   Claudication (HCC) 05/17/2016   Heart murmur 05/17/2016   Chest pain 12/25/2012   Essential hypertension    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   Polyneuropathy in diabetes(357.2) 12/22/2012   Plantar fasciitis, bilateral 12/22/2012   Mixed hyperlipidemia 12/22/2012   Osteoporosis, unspecified 12/22/2012   Vitamin D deficiency 12/22/2012   Restless legs syndrome (RLS) 12/22/2012   Anxiety 12/22/2012   Sleep disturbance 12/22/2012   Tobacco use disorder 12/22/2012   Alcohol dependence (HCC) 12/22/2012    Past Surgical History:  Procedure Laterality Date   ABDOMINAL HYSTERECTOMY     CORONARY STENT INTERVENTION N/A 06/20/2018   Procedure: CORONARY STENT INTERVENTION;  Surgeon: Tonny Bollman, MD;  Location: [facility] INVASIVE CV LAB;  Service: Cardiovascular;  Laterality: N/A;   KNEE SURGERY Right 2012   LEFT HEART CATH AND CORONARY ANGIOGRAPHY N/A 06/20/2018   Procedure: LEFT HEART CATH AND CORONARY ANGIOGRAPHY;  Surgeon: Tonny Bollman, MD;  Location: [facility] INVASIVE CV LAB;  Service: Cardiovascular;  Laterality: N/A;     OB History   No obstetric history on file.     Family History  Problem Relation Age of Onset   Heart attack Mother  Heart disease Mother    AAA (abdominal aortic aneurysm) Father    Cancer Brother    Hypertension Sister    Diabetes Brother     Social History   Tobacco Use   Smoking status: Every Day    Packs/day: 1.00    Years: 88.00    Pack years: 88.00    Types: Cigarettes   Smokeless tobacco: Never   Tobacco comments:    Currently smoker 1ppd  Substance Use Topics   Alcohol use: Yes    Comment: 1-2 glasses of Wine per day   Drug use: No    Home Medications Prior to Admission medications   Medication Sig Start Date End Date Taking? Authorizing Provider  aspirin EC 81 MG  tablet Take 81 mg by mouth daily.    [provider]  atorvastatin (LIPITOR) 80 MG tablet TAKE 1 TABLET BY MOUTH EVERY DAY AT 6PM 11/15/18   Quintella Reicherturner, Traci R, MD  cetirizine (ZYRTEC) 10 MG tablet Take 10 mg by mouth every morning.    [provider]  cholecalciferol (VITAMIN D3) 25 MCG (1000 UT) tablet Take 2,000 Units by mouth daily.    [provider]  cycloSPORINE (RESTASIS) 0.05 % ophthalmic emulsion Place 1 drop into both eyes 2 (two) times daily.    [provider]  DULoxetine (CYMBALTA) 60 MG capsule Take 60 mg by mouth daily. 10/30/18   [provider]  HUMALOG KWIKPEN 100 UNIT/ML KwikPen Inject 12 Units into the skin in the morning and at bedtime. 06/13/19   [provider]  insulin glargine (LANTUS SOLOSTAR) 100 UNIT/ML Solostar Pen Inject 30 Units into the skin 2 (two) times daily.    [provider]  lisinopril (ZESTRIL) 20 MG tablet Take 1 tablet (20 mg total) by mouth daily. 07/12/18 09/17/19  Quintella Reicherturner, Traci R, MD  lisinopril (ZESTRIL) 5 MG tablet  10/26/19   [provider]  metoprolol tartrate (LOPRESSOR) 50 MG tablet TAKE 1 TABLET BY MOUTH TWICE A DAY 09/18/19   Turner, Cornelious Bryantraci R, MD  nicotine (NICODERM CQ) 21 mg/24hr patch Place 1 patch (21 mg total) onto the skin daily. 06/28/18   Dyann KiefLenze, Michele M, PA-C  nitroGLYCERIN (NITROSTAT) 0.4 MG SL tablet PLACE 1 TABLET UNDER THE TONGUE EVERY 5MINS FOR 3 DOSES AS NEEDED FOR CHEST PAIN 08/22/20   Quintella Reicherturner, Traci R, MD  omeprazole (PRILOSEC) 20 MG capsule Take 20 mg by mouth daily.    [provider]  thiamine (VITAMIN B-1) 100 MG tablet Take 100 mg by mouth daily.    [provider]  ticagrelor (BRILINTA) 60 MG TABS tablet Take 1 tablet (60 mg total) by mouth 2 (two) times daily. 09/17/19   Quintella Reicherturner, Traci R, MD    Allergies    Penicillins  Review of Systems   Review of Systems  Constitutional:  Negative for fever.  Respiratory:  Negative for cough and shortness  of breath.   Cardiovascular:  Positive for chest pain.  Gastrointestinal:  Positive for abdominal pain and nausea. Negative for vomiting.  Musculoskeletal:  Positive for back pain.  All other systems reviewed and are negative.  Physical Exam Updated Vital Signs BP (!) 177/79   Pulse 90   Temp 97.7 F (36.5 C) (Oral)   Resp (!) 22   Ht 5\' 2"  (1.575 m)   Wt 70.3 kg   SpO2 98%   BMI 28.35 kg/m   Physical Exam Vitals and nursing note reviewed.  Constitutional:      General:  She is not in acute distress.    Appearance: She is well-developed. She is obese. She is not ill-appearing or diaphoretic.  HENT:     Head: Normocephalic and atraumatic.     Right Ear: External ear normal.     Left Ear: External ear normal.     Nose: Nose normal.  Eyes:     General:        Right eye: No discharge.        Left eye: No discharge.  Cardiovascular:     Rate and Rhythm: Normal rate and regular rhythm.     Heart sounds: Normal heart sounds.  Pulmonary:     Effort: Pulmonary effort is normal.     Breath sounds: Normal breath sounds.  Chest:     Chest wall: No tenderness.  Abdominal:     Palpations: Abdomen is soft.     Tenderness: There is abdominal tenderness (mild, but not consistent on repeat exams) in the right upper quadrant. There is no right CVA tenderness or left CVA tenderness.  Musculoskeletal:     Comments: No thoracic or lumbar tenderness on exam  Skin:    General: Skin is warm and dry.  Neurological:     Mental Status: She is alert.  Psychiatric:        Mood and Affect: Mood is not anxious.    ED Results / Procedures / Treatments   Labs (all labs ordered are listed, but only abnormal results are displayed) Labs Reviewed  COMPREHENSIVE METABOLIC PANEL - Abnormal; Notable for the following components:      Result Value   Sodium 124 (*)    Chloride 89 (*)    Glucose, Bld 178 (*)    Albumin 3.4 (*)    Total Bilirubin 1.4 (*)    All other components within normal  limits  CBC WITH DIFFERENTIAL/PLATELET - Abnormal; Notable for the following components:   WBC 27.1 (*)    Neutro Abs 25.2 (*)    Lymphs Abs 0.3 (*)    Monocytes Absolute 1.6 (*)    All other components within normal limits  URINALYSIS, ROUTINE W REFLEX MICROSCOPIC - Abnormal; Notable for the following components:   APPearance HAZY (*)    Glucose, UA 150 (*)    Hgb urine dipstick SMALL (*)    Ketones, ur 80 (*)    Protein, ur 30 (*)    Leukocytes,Ua TRACE (*)    Bacteria, UA MANY (*)    All other components within normal limits  TROPONIN I (HIGH SENSITIVITY) - Abnormal; Notable for the following components:   Troponin I (High Sensitivity) 21 (*)    All other components within normal limits  TROPONIN I (HIGH SENSITIVITY) - Abnormal; Notable for the following components:   Troponin I (High Sensitivity) 19 (*)    All other components within normal limits  RESP PANEL BY RT-PCR (FLU A&B, COVID) [id number]  URINE CULTURE  LIPASE, BLOOD  LACTIC ACID, PLASMA  LACTIC ACID, PLASMA    EKG EKG Interpretation  Date/Time:  Tuesday September 09 2020 11:44:22 EDT Ventricular Rate:  91 PR Interval:  138 QRS Duration: 82 QT Interval:  363 QTC Calculation: 447 R Axis:   49 Text Interpretation: Sinus rhythm Atrial premature complex Abnormal T, consider ischemia, lateral leads Confirmed by Pricilla Loveless [phone]) on 09/09/2020 11:46:50 AM  Radiology DG Chest 2 View  Result Date: 09/09/2020 CLINICAL DATA:  Chest pain. EXAM: CHEST - 2 VIEW COMPARISON:  September 07, 2020. FINDINGS: The heart  size and mediastinal contours are within normal limits. Both lungs are clear. The visualized skeletal structures are unremarkable. IMPRESSION: No active cardiopulmonary disease. Aortic Atherosclerosis (ICD10-I70.0). Electronically Signed   By: Lupita Raider M.D.   On: 09/09/2020 13:20   US Abdomen Limited RUQ (LIVER/GB)  Result Date: 09/09/2020 CLINICAL DATA:  Chest pain abdominal pain since Sunday EXAM:  ULTRASOUND ABDOMEN LIMITED RIGHT UPPER QUADRANT COMPARISON:  Ultrasound 09/07/2020 CT 09/07/2020 FINDINGS: Gallbladder: Gallbladder is nondistended. Several echogenic gallstones are present the lumen of the gallbladder including a 19 mm stone towards the neck of the gallbladder. No pericholecystic fluid. No gallbladder wall thickening. Negative sonographic Murphy's sign. Common bile duct: Diameter: Normal at 3 mm Liver: Liver has uniform increase in echogenicity. There are several hypoechoic regions along the gallbladder fossa most suggestive of focal fatty sparing. Portal vein is patent on color Doppler imaging with normal direction of blood flow towards the liver. Other: Ascites IMPRESSION: 1. No interval change from ultrasound 09/07/2020. 2. Cholelithiasis without acute cholecystitis. 3. Increased liver echogenicity suggests hepatic steatosis. No significant steatosis noted on comparison CT 09/07/2020. 4. Hypoechoic regions along the gallbladder fossa most suggestive focal fatty sparing. 5. With elevated bilirubin and elevated white blood cell count, consider contrast MRI for further evaluation. Electronically Signed   By: Genevive Bi M.D.   On: 09/09/2020 15:02    Procedures Procedures   Medications Ordered in ED Medications  nitroGLYCERIN (NITROSTAT) SL tablet 0.4 mg (0.4 mg Sublingual Given 09/09/20 1400)  ciprofloxacin (CIPRO) IVPB 400 mg (has no administration in time range)    And  metroNIDAZOLE (FLAGYL) IVPB 500 mg (has no administration in time range)  sodium chloride 0.9 % bolus 500 mL (0 mLs Intravenous Stopped 09/09/20 1522)  morphine 4 MG/ML injection 4 mg (4 mg Intravenous Given 09/09/20 1537)    ED Course  I have reviewed the triage vital signs and the nursing notes.  Pertinent labs & imaging results that were available during my care of the patient were reviewed by me and considered in my medical decision making (see chart for details).    MDM Rules/Calculators/A&P                            Patient's pain location is nonspecific.  Seems to be more she is feeling pain in her left lower chest and left upper quadrant as well as her thoracic back.  However she is repeatedly tender in the right upper quadrant and has a sizable gallstone at the neck of the gallbladder.  Her ECG is similar to 2 days ago and troponins are flat and minimally elevated.  This could all be due to hypertension.  I am concerned with her rising white blood cell count that is significantly elevated that this might be an acute gallbladder problem.  She was given IV antibiotics and I have consulted surgery who will see.  We will keep NPO.  She will need a hospitalist admission.  Her urine has many bacteria but she does not really have any urinary symptoms besides not urinating as much today.  Cipro should cover this.  She was noted to have a full bladder on the CT couple days ago but she now has no urinary retention so I do not think that was pathologic. General surgery consulted. Final Clinical Impression(s) / ED Diagnoses Final diagnoses:  Chest pain    Rx / DC Orders ED Discharge Orders     None  Pricilla Loveless, MD 09/09/20 [phone]

## 2020-09-09 NOTE — Consult Note (Signed)
Sarah Francis Jun 03, 1945  [id number].    Requesting MD: Dr. Pricilla Loveless Chief Complaint/Reason for Consult: Abdominal pain w/ cholelithiasis   HPI: Sarah Francis is a 75 y.o. female with a hx of CAD (s/p stenting on Brilinta w/ the last dose on 8/15), PAD, DM2, HTN, HLD, tobacco abuse, GERD, and carotid artery stenosis who presented with left-sided chest pain and left-sided abdominal pain. Patient was seen in the ED on 8/14 for several hour hx of "ripping" back, chest and abdominal pain all on the left side. She underwent workup w/ reassuring CTA, troponins and ekg, except CTA mentions bulky plaque disease and complete R ICA occlusion. She was noted to have gallstones on CTA. RUQ [place] was obtained and without signs of acute cholecystitis. LFT's wnl. WBC 14.1. Lipase 29. She was discharged home w/ recommendation to follow up with general surgery and cardiology.   She reports she was doing well at home but last night she began having left inferior chest pain that radiated to her back and abdomen. No RUQ abdominal pain, fever, chills, or emesis. She does report occasional nausea that is not associated with eating. Her symptoms are not postprandial and she denies hx of similar symptoms prior to 8/14. She tried 4 baby asa w/ some relief. She presented to the ED for evaluation and was found to have WBC 27.1, T bili 1.4, LFTs otherwise wnl, lipase 27, UTI on UA, slightly elevated tn's but flat (21 > 19), and RUQ [place] w/ cholelithiasis w/o cholecystitis. CBD 19mm. We were asked to see.   Patient is a third grade teacher. She smokes 1PPD and has ~88 year pack hx. She drinks. She drinks 1-2 glasses of wine per day. She has a hx of abdominal hysterectomy.    ROS: Review of Systems  Respiratory:  Positive for shortness of breath.   Cardiovascular:  Positive for chest pain.  Gastrointestinal:  Positive for abdominal pain and nausea.  All other systems reviewed and are negative.  Family History  Problem  Relation Age of Onset   Heart attack Mother    Heart disease Mother    AAA (abdominal aortic aneurysm) Father    Cancer Brother    Hypertension Sister    Diabetes Brother     Past Medical History:  Diagnosis Date   Anxiety    Aortic atherosclerosis (HCC)    CAD (coronary artery disease), native coronary artery    06/20/18 PCI/DES overlapping stents to p/m RCA, mild disease in LAD, and Lcx   Carotid artery stenosis    40-59% by dopplers 12/2018   Diabetes mellitus without complication (HCC)    GERD (gastroesophageal reflux disease)    HLD (hyperlipidemia)    Hypertension    Major depression    initial diagnosis/medication around age 5   Osteoporosis    w h/o sacral fracture   PAD (peripheral artery disease) (HCC)    high grade stenosis in mid left SFA - followed by Dr. Allyson Sabal   Syncope 12/2018   felt to be vasovagal    Past Surgical History:  Procedure Laterality Date   ABDOMINAL HYSTERECTOMY     CORONARY STENT INTERVENTION N/A 06/20/2018   Procedure: CORONARY STENT INTERVENTION;  Surgeon: Tonny Bollman, MD;  Location: [facility] INVASIVE CV LAB;  Service: Cardiovascular;  Laterality: N/A;   KNEE SURGERY Right 2012   LEFT HEART CATH AND CORONARY ANGIOGRAPHY N/A 06/20/2018   Procedure: LEFT HEART CATH AND CORONARY ANGIOGRAPHY;  Surgeon: Tonny Bollman, MD;  Location: [facility]  INVASIVE CV LAB;  Service: Cardiovascular;  Laterality: N/A;    Social History:  reports that she has been smoking. She has a 88.00 pack-year smoking history. She has never used smokeless tobacco. She reports current alcohol use. She reports that she does not use drugs.  Allergies:  Allergies  Allergen Reactions   Penicillins Rash    Did it involve swelling of the face/tongue/throat, SOB, or low BP? Yes Did it involve sudden or severe rash/hives, skin peeling, or any reaction on the inside of your mouth or nose? Unknown Did you need to seek medical attention at a hospital or doctor's office? Unknown When did it  last happen?    Pt was a child, had lip swelling   If all above answers are "NO", may proceed with cephalosporin use.     (Not in a hospital admission)    Physical Exam: Blood pressure (!) 177/79, pulse 90, temperature 97.7 F (36.5 C), temperature source Oral, resp. rate (!) 22, height 5\' 2"  (1.575 m), weight 70.3 kg, SpO2 98 %. General: pleasant, WD, WN white female who is laying in bed in NAD HEENT: head is normocephalic, atraumatic.  Sclera are noninjected.  PERRL.  Ears and nose without any masses or lesions.  Mouth is pink and moist Heart: regular, rate, and rhythm, few ectopic beats.  Normal s1,s2. No obvious gallops, or rubs noted, but with a murmur present.  Palpable radial and pedal pulses bilaterally Lungs: CTAB, no wheezes, rhonchi, or rales noted.  Respiratory effort nonlabored Abd: soft, tender in LUQ mostly, maybe minimal RUQ tenderness but negative Murphy's sign, ND, +BS, no masses, hernias, or organomegaly MS: all 4 extremities are symmetrical with no cyanosis, clubbing, or edema. Skin: warm and dry with no masses, lesions, or rashes Neuro: Cranial nerves 2-12 grossly intact, sensation is normal throughout Psych: A&Ox3 with an appropriate affect.   Results for orders placed or performed during the hospital encounter of 09/09/20 (from the past 48 hour(s))  Comprehensive metabolic panel     Status: Abnormal   Collection Time: 09/09/20 12:10 PM  Result Value Ref Range   Sodium 124 (L) 135 - 145 mmol/L   Potassium 3.5 3.5 - 5.1 mmol/L   Chloride 89 (L) 98 - 111 mmol/L   CO2 22 22 - 32 mmol/L   Glucose, Bld 178 (H) 70 - 99 mg/dL    Comment: Glucose reference range applies only to samples taken after fasting for at least 8 hours.   BUN 9 8 - 23 mg/dL   Creatinine, Ser 09/11/20 0.44 - 1.00 mg/dL   Calcium 8.9 8.9 - 6.60 mg/dL   Total Protein 7.2 6.5 - 8.1 g/dL   Albumin 3.4 (L) 3.5 - 5.0 g/dL   AST 17 15 - 41 U/L   ALT 26 0 - 44 U/L   Alkaline Phosphatase 75 38 - 126 U/L    Total Bilirubin 1.4 (H) 0.3 - 1.2 mg/dL   GFR, Estimated 63.0 >16 mL/min    Comment: (NOTE) Calculated using the CKD-EPI Creatinine Equation (2021)    Anion gap 13 5 - 15    Comment: Performed at [facility], [address] [place], [place]  Lipase, blood     Status: None   Collection Time: 09/09/20 12:10 PM  Result Value Ref Range   Lipase 27 11 - 51 U/L    Comment: Performed at [facility], [address], [place], [place]  CBC with Differential  Status: Abnormal   Collection Time: 09/09/20 12:10 PM  Result Value Ref Range   WBC 27.1 (H) 4.0 - 10.5 K/uL   RBC 4.27 3.87 - 5.11 MIL/uL   Hemoglobin 14.1 12.0 - 15.0 g/dL   HCT 64.4 03.4 - 74.2 %   MCV 94.1 80.0 - 100.0 fL   MCH 33.0 26.0 - 34.0 pg   MCHC 35.1 30.0 - 36.0 g/dL   RDW 59.5 63.8 - 75.6 %   Platelets 248 150 - 400 K/uL   nRBC 0.0 0.0 - 0.2 %   Neutrophils Relative % 93 %   Neutro Abs 25.2 (H) 1.7 - 7.7 K/uL   Lymphocytes Relative 1 %   Lymphs Abs 0.3 (L) 0.7 - 4.0 K/uL   Monocytes Relative 6 %   Monocytes Absolute 1.6 (H) 0.1 - 1.0 K/uL   Eosinophils Relative 0 %   Eosinophils Absolute 0.0 0.0 - 0.5 K/uL   Basophils Relative 0 %   Basophils Absolute 0.0 0.0 - 0.1 K/uL   WBC Morphology MORPHOLOGY UNREMARKABLE    RBC Morphology MORPHOLOGY UNREMARKABLE    Smear Review MORPHOLOGY UNREMARKABLE    Abs Immature Granulocytes 0.00 0.00 - 0.07 K/uL    Comment: Performed at [facility], [address] [place], [place] [address]  Troponin I (High Sensitivity)     Status: Abnormal   Collection Time: 09/09/20 12:10 PM  Result Value Ref Range   Troponin I (High Sensitivity) 21 (H) <18 ng/L    Comment: (NOTE) Elevated high sensitivity troponin I (hsTnI) values and significant  changes across serial measurements may suggest ACS but many other  chronic and acute conditions are known to elevate hsTnI results.  Refer to the "Links" section for chest pain algorithms and additional   guidance. Performed at [facility], [address], [place], [place] [address]   Urinalysis, Routine w reflex microscopic Urine, Clean Catch     Status: Abnormal   Collection Time: 09/09/20  2:03 PM  Result Value Ref Range   Color, Urine YELLOW YELLOW   APPearance HAZY (A) CLEAR   Specific Gravity, Urine 1.016 1.005 - 1.030   pH 7.0 5.0 - 8.0   Glucose, UA 150 (A) NEGATIVE mg/dL   Hgb urine dipstick SMALL (A) NEGATIVE   Bilirubin Urine NEGATIVE NEGATIVE   Ketones, ur 80 (A) NEGATIVE mg/dL   Protein, ur 30 (A) NEGATIVE mg/dL   Nitrite NEGATIVE NEGATIVE   Leukocytes,Ua TRACE (A) NEGATIVE   RBC / HPF 21-50 0 - 5 RBC/hpf   WBC, UA 0-5 0 - 5 WBC/hpf   Bacteria, UA MANY (A) NONE SEEN   Squamous Epithelial / LPF 0-5 0 - 5   Mucus PRESENT     Comment: Performed at [facility], [address] [place], [place] [address]   DG Chest 2 View  Result Date: 09/09/2020 CLINICAL DATA:  Chest pain. EXAM: CHEST - 2 VIEW COMPARISON:  September 07, 2020. FINDINGS: The heart size and mediastinal contours are within normal limits. Both lungs are clear. The visualized skeletal structures are unremarkable. IMPRESSION: No active cardiopulmonary disease. Aortic Atherosclerosis (ICD10-I70.0). Electronically Signed   By: Lupita Raider M.D.   On: 09/09/2020 13:20   US Abdomen Limited RUQ (LIVER/GB)  Result Date: 09/09/2020 CLINICAL DATA:  Chest pain abdominal pain since Sunday EXAM: ULTRASOUND ABDOMEN LIMITED RIGHT UPPER QUADRANT COMPARISON:  Ultrasound 09/07/2020 CT 09/07/2020 FINDINGS: Gallbladder: Gallbladder is nondistended. Several echogenic gallstones are present the lumen of the  gallbladder including a 19 mm stone towards the neck of the gallbladder. No pericholecystic fluid. No gallbladder wall thickening. Negative sonographic Murphy's sign. Common bile duct: Diameter: Normal at 3 mm Liver: Liver has uniform increase in echogenicity. There are several hypoechoic regions along the gallbladder  fossa most suggestive of focal fatty sparing. Portal vein is patent on color Doppler imaging with normal direction of blood flow towards the liver. Other: Ascites IMPRESSION: 1. No interval change from ultrasound 09/07/2020. 2. Cholelithiasis without acute cholecystitis. 3. Increased liver echogenicity suggests hepatic steatosis. No significant steatosis noted on comparison CT 09/07/2020. 4. Hypoechoic regions along the gallbladder fossa most suggestive focal fatty sparing. 5. With elevated bilirubin and elevated white blood cell count, consider contrast MRI for further evaluation. Electronically Signed   By: Genevive BiStewart  Edmunds M.D.   On: 09/09/2020 15:02    Anti-infectives (From admission, onward)    Start     Dose/Rate Route Frequency Ordered Stop   09/09/20 1530  ciprofloxacin (CIPRO) IVPB 400 mg       See Hyperspace for full Linked Orders Report.   400 mg 200 mL/hr over 60 Minutes Intravenous  Once 09/09/20 1515     09/09/20 1530  metroNIDAZOLE (FLAGYL) IVPB 500 mg       See Hyperspace for full Linked Orders Report.   500 mg 100 mL/hr over 60 Minutes Intravenous  Once 09/09/20 1515         Assessment/Plan Left-sided abdominal pain/chest pain, gallstones The patient's story and symptoms are not totally c/w biliary disease.  Most of her pain is in her left chest, left upper quadrant, and behind her left breast going to her back.  She states she "may" have a little right-sided discomfort, but her pain has been on the left-side mostly.  She does have a large gallstone on her US, but her gallbladder is contracted which is somewhat suggestive that she doesn't have cholecystitis.  We will plan to proceed with a HIDA scan on 8/17 so NPO p MN and no narcotics p MN for this test.  This will definitively rule in or out her gallbladder.  In the interim, her Brilinta should be held for at least 5 days prior to surgical intervention.  She also has bulky plaque disease and R ICA occlusion of her carotid on  recent CTA of her neck.  This will need to be evaluated prior to any possible surgery as her risk for CVA would be definitely increased.  She does also have evidence of a UTI along with some blood in her urine.  It may be that some of her symptoms are coming from her UTI and possibly ascending up towards her kidney on the left side.  This is unclear, but she is being started on Cipro/Flagyl for her UTI.  We will continue to follow and monitor the patient.   FEN - NPO p MN for HIDA tomorrow VTE - hold Brilinta ID - Cipro/Flagyl  R ICA occlusion with carotid artery stenosis CAD, s/p stenting on Brilinta PAD DM Tobacco abuse HTN HLD GERD  Letha CapeKelly E Jodi Kappes, [facility] 09/09/2020, 3:31 PM Please see Amion for pager number during day hours 7:00am-4:30pm

## 2020-09-10 ENCOUNTER — Other Ambulatory Visit: Payer: Self-pay | Admitting: Diagnostic Radiology

## 2020-09-10 ENCOUNTER — Observation Stay (HOSPITAL_COMMUNITY): Payer: Medicare PPO

## 2020-09-10 DIAGNOSIS — I10 Essential (primary) hypertension: Secondary | ICD-10-CM | POA: Diagnosis present

## 2020-09-10 DIAGNOSIS — K8001 Calculus of gallbladder with acute cholecystitis with obstruction: Secondary | ICD-10-CM | POA: Diagnosis present

## 2020-09-10 DIAGNOSIS — N390 Urinary tract infection, site not specified: Secondary | ICD-10-CM | POA: Diagnosis present

## 2020-09-10 DIAGNOSIS — M810 Age-related osteoporosis without current pathological fracture: Secondary | ICD-10-CM | POA: Diagnosis present

## 2020-09-10 DIAGNOSIS — I2510 Atherosclerotic heart disease of native coronary artery without angina pectoris: Secondary | ICD-10-CM | POA: Diagnosis present

## 2020-09-10 DIAGNOSIS — I700 Atherosclerosis of aorta: Secondary | ICD-10-CM | POA: Diagnosis present

## 2020-09-10 DIAGNOSIS — I6523 Occlusion and stenosis of bilateral carotid arteries: Secondary | ICD-10-CM | POA: Diagnosis not present

## 2020-09-10 DIAGNOSIS — E860 Dehydration: Secondary | ICD-10-CM | POA: Diagnosis present

## 2020-09-10 DIAGNOSIS — K8012 Calculus of gallbladder with acute and chronic cholecystitis without obstruction: Secondary | ICD-10-CM | POA: Diagnosis not present

## 2020-09-10 DIAGNOSIS — K810 Acute cholecystitis: Secondary | ICD-10-CM | POA: Diagnosis not present

## 2020-09-10 DIAGNOSIS — E1140 Type 2 diabetes mellitus with diabetic neuropathy, unspecified: Secondary | ICD-10-CM | POA: Diagnosis present

## 2020-09-10 DIAGNOSIS — E871 Hypo-osmolality and hyponatremia: Secondary | ICD-10-CM | POA: Diagnosis present

## 2020-09-10 DIAGNOSIS — Z20822 Contact with and (suspected) exposure to covid-19: Secondary | ICD-10-CM | POA: Diagnosis present

## 2020-09-10 DIAGNOSIS — R1012 Left upper quadrant pain: Secondary | ICD-10-CM | POA: Diagnosis not present

## 2020-09-10 DIAGNOSIS — Z79899 Other long term (current) drug therapy: Secondary | ICD-10-CM | POA: Diagnosis not present

## 2020-09-10 DIAGNOSIS — K449 Diaphragmatic hernia without obstruction or gangrene: Secondary | ICD-10-CM | POA: Diagnosis present

## 2020-09-10 DIAGNOSIS — E559 Vitamin D deficiency, unspecified: Secondary | ICD-10-CM

## 2020-09-10 DIAGNOSIS — G2581 Restless legs syndrome: Secondary | ICD-10-CM | POA: Diagnosis present

## 2020-09-10 DIAGNOSIS — R079 Chest pain, unspecified: Secondary | ICD-10-CM

## 2020-09-10 DIAGNOSIS — K82A1 Gangrene of gallbladder in cholecystitis: Secondary | ICD-10-CM | POA: Diagnosis present

## 2020-09-10 DIAGNOSIS — K219 Gastro-esophageal reflux disease without esophagitis: Secondary | ICD-10-CM | POA: Diagnosis present

## 2020-09-10 DIAGNOSIS — Z7902 Long term (current) use of antithrombotics/antiplatelets: Secondary | ICD-10-CM | POA: Diagnosis not present

## 2020-09-10 DIAGNOSIS — E1151 Type 2 diabetes mellitus with diabetic peripheral angiopathy without gangrene: Secondary | ICD-10-CM | POA: Diagnosis present

## 2020-09-10 DIAGNOSIS — Z0389 Encounter for observation for other suspected diseases and conditions ruled out: Secondary | ICD-10-CM | POA: Diagnosis not present

## 2020-09-10 DIAGNOSIS — Z01810 Encounter for preprocedural cardiovascular examination: Secondary | ICD-10-CM

## 2020-09-10 DIAGNOSIS — F17210 Nicotine dependence, cigarettes, uncomplicated: Secondary | ICD-10-CM | POA: Diagnosis present

## 2020-09-10 DIAGNOSIS — K8000 Calculus of gallbladder with acute cholecystitis without obstruction: Secondary | ICD-10-CM | POA: Diagnosis not present

## 2020-09-10 DIAGNOSIS — I252 Old myocardial infarction: Secondary | ICD-10-CM | POA: Diagnosis not present

## 2020-09-10 DIAGNOSIS — R1010 Upper abdominal pain, unspecified: Secondary | ICD-10-CM | POA: Diagnosis not present

## 2020-09-10 DIAGNOSIS — E782 Mixed hyperlipidemia: Secondary | ICD-10-CM | POA: Diagnosis present

## 2020-09-10 DIAGNOSIS — Z794 Long term (current) use of insulin: Secondary | ICD-10-CM | POA: Diagnosis not present

## 2020-09-10 DIAGNOSIS — E669 Obesity, unspecified: Secondary | ICD-10-CM | POA: Diagnosis present

## 2020-09-10 DIAGNOSIS — K760 Fatty (change of) liver, not elsewhere classified: Secondary | ICD-10-CM | POA: Diagnosis present

## 2020-09-10 LAB — BASIC METABOLIC PANEL
Anion gap: 13 (ref 5–15)
BUN: 8 mg/dL (ref 8–23)
CO2: 20 mmol/L — ABNORMAL LOW (ref 22–32)
Calcium: 8.2 mg/dL — ABNORMAL LOW (ref 8.9–10.3)
Chloride: 91 mmol/L — ABNORMAL LOW (ref 98–111)
Creatinine, Ser: 0.72 mg/dL (ref 0.44–1.00)
GFR, Estimated: >60 mL/min (ref >60)
Glucose, Bld: 192 mg/dL — ABNORMAL HIGH (ref 70–99)
Potassium: 3.6 mmol/L (ref 3.5–5.1)
Sodium: 124 mmol/L — ABNORMAL LOW (ref 135–145)

## 2020-09-10 LAB — GLUCOSE, CAPILLARY
Glucose-Capillary: 203 mg/dL — ABNORMAL HIGH (ref 70–99)
Glucose-Capillary: 216 mg/dL — ABNORMAL HIGH (ref 70–99)
Glucose-Capillary: 219 mg/dL — ABNORMAL HIGH (ref 70–99)

## 2020-09-10 LAB — HEPATIC FUNCTION PANEL
ALT: 26 U/L (ref 0–44)
AST: 25 U/L (ref 15–41)
Albumin: 3 g/dL — ABNORMAL LOW (ref 3.5–5.0)
Alkaline Phosphatase: 93 U/L (ref 38–126)
Bilirubin, Direct: 0.2 mg/dL (ref 0.0–0.2)
Indirect Bilirubin: 1 mg/dL — ABNORMAL HIGH (ref 0.3–0.9)
Total Bilirubin: 1.2 mg/dL (ref 0.3–1.2)
Total Protein: 6.3 g/dL — ABNORMAL LOW (ref 6.5–8.1)

## 2020-09-10 LAB — CBC
HCT: 37 % (ref 36.0–46.0)
Hemoglobin: 12.8 g/dL (ref 12.0–15.0)
MCH: 32.7 pg (ref 26.0–34.0)
MCHC: 34.6 g/dL (ref 30.0–36.0)
MCV: 94.4 fL (ref 80.0–100.0)
Platelets: 224 10*3/uL (ref 150–400)
RBC: 3.92 MIL/uL (ref 3.87–5.11)
RDW: 13 % (ref 11.5–15.5)
WBC: 27.8 10*3/uL — ABNORMAL HIGH (ref 4.0–10.5)
nRBC: 0 % (ref 0.0–0.2)

## 2020-09-10 LAB — URINE CULTURE

## 2020-09-10 MED ORDER — FENTANYL CITRATE (PF) 100 MCG/2ML IJ SOLN
12.5000 ug | INTRAMUSCULAR | Status: DC | PRN
  Administered 2020-09-12: 50 ug via INTRAVENOUS
  Filled 2020-09-10: qty 2

## 2020-09-10 MED ORDER — TECHNETIUM TC 99M MEBROFENIN IV KIT
4.8500 | PACK | Freq: Once | INTRAVENOUS | Status: AC | PRN
  Administered 2020-09-10: 4.85 via INTRAVENOUS

## 2020-09-10 MED ORDER — PERFLUTREN LIPID MICROSPHERE
1.0000 mL | INTRAVENOUS | Status: DC | PRN
  Administered 2020-09-10: 1 mL via INTRAVENOUS
  Filled 2020-09-10: qty 10

## 2020-09-10 MED ORDER — MORPHINE SULFATE (PF) 4 MG/ML IV SOLN
3.0000 mg | Freq: Once | INTRAVENOUS | Status: DC
Start: 2020-09-10 — End: 2020-09-10
  Administered 2020-09-10: 3 mg via INTRAVENOUS
  Filled 2020-09-10: qty 1

## 2020-09-10 NOTE — Consult Note (Signed)
Cardiology Consultation:   Patient ID: Sarah Francis MRN: [id number]; DOB: 1945-10-23  Admit date: 09/09/2020 Date of Consult: 09/10/2020  PCP:  Gweneth Dimitri, MD   [facility] Providers Cardiologist:  Armanda Magic, MD        Patient Profile:   Sarah Francis is a 75 y.o. female with a hx of CAD who is being seen 09/10/2020 for the evaluation of preoperative clearance at the request of Dr. Jarvis Newcomer.  History of Present Illness:   Sarah Francis who has multiple risk factors for coronary artery disease.  She had a mild non-STEMI in May 2020.  She had overlapping stents placed in the RCA.  There is mild to moderate disease in the left system that did not require revascularization.  Follow-up echocardiogram showed normal LV function.  Since that time she has done well.  She has not needed repeat catheterization.  She has not had any stress testing.  She developed severe discomfort in her upper abdomen and was concerned that this was actually her heart.  She came to the hospital but troponin was negative.  ECG showed normal sinus rhythm with lateral T wave changes.  Further work-up showed that she has cholecystitis and will require cholecystectomy.  We are asked to provide preoperative evaluation and risk assessment.  She does not do a lot of dedicated exercise.  She does walk in and out of her house.  She does usual activities like grocery shopping.  She has no exertional angina with these activities.   Past Medical History:  Diagnosis Date   Anxiety    Aortic atherosclerosis (HCC)    CAD (coronary artery disease), native coronary artery    06/20/18 PCI/DES overlapping stents to p/m RCA, mild disease in LAD, and Lcx   Carotid artery stenosis    40-59% by dopplers 12/2018   Diabetes mellitus without complication (HCC)    GERD (gastroesophageal reflux disease)    HLD (hyperlipidemia)    Hypertension    Major depression    initial diagnosis/medication around age 62   Osteoporosis    w  h/o sacral fracture   PAD (peripheral artery disease) (HCC)    high grade stenosis in mid left SFA - followed by Dr. Allyson Sabal   Syncope 12/2018   felt to be vasovagal    Past Surgical History:  Procedure Laterality Date   ABDOMINAL HYSTERECTOMY     CORONARY STENT INTERVENTION N/A 06/20/2018   Procedure: CORONARY STENT INTERVENTION;  Surgeon: Tonny Bollman, MD;  Location: [facility] INVASIVE CV LAB;  Service: Cardiovascular;  Laterality: N/A;   KNEE SURGERY Right 2012   LEFT HEART CATH AND CORONARY ANGIOGRAPHY N/A 06/20/2018   Procedure: LEFT HEART CATH AND CORONARY ANGIOGRAPHY;  Surgeon: Tonny Bollman, MD;  Location: [facility] INVASIVE CV LAB;  Service: Cardiovascular;  Laterality: N/A;       Inpatient Medications: Scheduled Meds:  aspirin EC  81 mg Oral Daily   atorvastatin  80 mg Oral QHS   cholecalciferol  2,000 Units Oral Daily   cycloSPORINE  1 drop Both Eyes BID   enoxaparin (LOVENOX) injection  40 mg Subcutaneous Q24H   insulin aspart  0-15 Units Subcutaneous TID WC   insulin aspart  0-5 Units Subcutaneous QHS   lisinopril  5 mg Oral Daily   loratadine  10 mg Oral Daily   metoprolol tartrate  50 mg Oral BID   pantoprazole (PROTONIX) IV  40 mg Intravenous Q12H   thiamine  100 mg Oral Daily   Continuous Infusions:  ciprofloxacin  Stopped (09/10/20 1638)   And   metronidazole 100 mL/hr at 09/10/20 1648   lactated ringers Stopped (09/10/20 1532)   PRN Meds: acetaminophen **OR** acetaminophen, fentaNYL (SUBLIMAZE) injection, hydrALAZINE, nitroGLYCERIN, ondansetron **OR** ondansetron (ZOFRAN) IV, oxyCODONE  Allergies:    Allergies  Allergen Reactions   Penicillins Rash    Did it involve swelling of the face/tongue/throat, SOB, or low BP? Yes Did it involve sudden or severe rash/hives, skin peeling, or any reaction on the inside of your mouth or nose? Unknown Did you need to seek medical attention at a hospital or doctor's office? Unknown When did it last happen?    Pt was a child,  had lip swelling   If all above answers are "NO", may proceed with cephalosporin use.     Social History:   Social History   Socioeconomic History   Marital status: Single    Spouse name: Not on file   Number of children: Not on file   Years of education: Not on file   Highest education level: Not on file  Occupational History   Not on file  Tobacco Use   Smoking status: Every Day    Packs/day: 1.00    Years: 88.00    Pack years: 88.00    Types: Cigarettes   Smokeless tobacco: Never   Tobacco comments:    Currently smoker 1ppd  Substance and Sexual Activity   Alcohol use: Yes    Comment: 1-2 glasses of Wine per day   Drug use: No   Sexual activity: Not Currently  Other Topics Concern   Not on file  Social History Narrative   Not on file   Social Determinants of Health   Financial Resource Strain: Not on file  Food Insecurity: Not on file  Transportation Needs: Not on file  Physical Activity: Not on file  Stress: Not on file  Social Connections: Not on file  Intimate Partner Violence: Not on file    Family History:    Family History  Problem Relation Age of Onset   Heart attack Mother    Heart disease Mother    AAA (abdominal aortic aneurysm) Father    Cancer Brother    Hypertension Sister    Diabetes Brother      ROS:  Please see the history of present illness.  Abdominal pain All other ROS reviewed and negative.     Physical Exam/Data:   Vitals:   09/10/20 0022 09/10/20 0311 09/10/20 0836 09/10/20 1428  BP: 136/75 (!) 149/72 134/64 133/68  Pulse: (!) 110 (!) 110 (!) 105 97  Resp: Temp: 98 F (36.7 C) 98 F (36.7 C) 99.1 F (37.3 C) 97.7 F (36.5 C)  TempSrc: Oral Oral Oral Oral  SpO2: 98% 95% 94% 95%  Weight:      Height:        Intake/Output Summary (Last 24 hours) at 09/10/2020 1752 Last data filed at 09/10/2020 1648 Gross per 24 hour  Intake 1658.83 ml  Output --  Net 1658.83 ml   Last 3 Weights 09/09/2020 09/07/2020  10/30/2019  Weight (lbs) 155 lb 156 lb 156 lb 6.4 oz  Weight (kg) 70.308 kg 70.761 kg 70.943 kg     Body mass index is 28.35 kg/m.  General:  Well nourished, well developed, in no acute distress HEENT: normal Lymph: no adenopathy Neck: no JVD Endocrine:  No thryomegaly Vascular: No carotid bruits; FA pulses 2+ bilaterally without bruits  Cardiac:  normal S1, S2;  RRR; no murmur  Lungs:  clear to auscultation bilaterally, no wheezing, rhonchi or rales  Abd: soft, nontender, no hepatomegaly  Ext: no edema Musculoskeletal:  No deformities, BUE and BLE strength normal and equal Skin: warm and dry  Neuro:  CNs 2-12 intact, no focal abnormalities noted Psych:  Normal affect   EKG:  The EKG was personally reviewed and demonstrates: Normal sinus rhythm, lateral T wave inversions Telemetry:  Telemetry was personally reviewed and demonstrates: Normal sinus rhythm  Relevant CV Studies: Prior cath result reviewed  Laboratory Data:  High Sensitivity Troponin:   Recent Labs  Lab 09/07/20 0447 09/07/20 0645 09/09/20 1210 09/09/20 1443  TROPONINIHS 14 16 21* 19*     Chemistry Recent Labs  Lab 09/09/20 1210 09/09/20 2032 09/10/20 0126  NA 124* 125* 124*  K 3.5 3.5 3.6  CL 89* 92* 91*  CO2 22 21* 20*  GLUCOSE 178* 157* 192*  BUN 9 8 8   CREATININE 0.66 0.60 0.72  CALCIUM 8.9 8.2* 8.2*  GFRNONAA >60 >60 >60  ANIONGAP 13 12 13     Recent Labs  Lab 09/07/20 0447 09/09/20 1210 09/10/20 0126  PROT 7.0 7.2 6.3*  ALBUMIN 4.0 3.4* 3.0*  AST 32 17 25  ALT 43 26 26  ALKPHOS 64 75 93  BILITOT 1.0 1.4* 1.2   Hematology Recent Labs  Lab 09/09/20 1210 09/09/20 2032 09/10/20 0126  WBC 27.1* 26.4* 27.8*  RBC 4.27 3.94 3.92  HGB 14.1 13.1 12.8  HCT 40.2 37.3 37.0  MCV 94.1 94.7 94.4  MCH 33.0 33.2 32.7  MCHC 35.1 35.1 34.6  RDW 12.9 13.0 13.0  PLT 248 220 224   BNPNo results for input(s): BNP, PROBNP in the last 168 hours.  DDimer No results for input(s): DDIMER in the  last 168 hours.   Radiology/Studies:  DG Chest 2 View  Result Date: 09/09/2020 CLINICAL DATA:  Chest pain. EXAM: CHEST - 2 VIEW COMPARISON:  September 07, 2020. FINDINGS: The heart size and mediastinal contours are within normal limits. Both lungs are clear. The visualized skeletal structures are unremarkable. IMPRESSION: No active cardiopulmonary disease. Aortic Atherosclerosis (ICD10-I70.0). Electronically Signed   By: Lupita RaiderJames  Green Jr M.D.   On: 09/09/2020 13:20   NM Hepatobiliary Liver Func  Result Date: 09/10/2020 CLINICAL DATA:  Rule out cholecystitis. EXAM: NUCLEAR MEDICINE HEPATOBILIARY IMAGING TECHNIQUE: Sequential images of the abdomen were obtained out to 60 minutes following intravenous administration of radiopharmaceutical. RADIOPHARMACEUTICALS:  4.85 mCi Tc-682m  Choletec IV COMPARISON:  Abdominal sonogram 05/10/2020. FINDINGS: Prompt uptake and biliary excretion of activity by the liver is seen. Biliary activity passes into small bowel, consistent with patent common bile duct. After 60 minutes of imaging no gallbladder activity was visualized. Patient then received 3 mg of morphine IV, and imaging was carried out for an additional 30 minutes. After 30 minutes no gallbladder activity was visualized. IMPRESSION: Nonvisualization of the gallbladder compatible with cystic duct obstruction due to acute cholecystitis. These results will be called to the ordering clinician or representative by the Radiologist Assistant, and communication documented in the PACS or [employer] Dashboard. Electronically Signed   By: Signa Kellaylor  Stroud M.D.   On: 09/10/2020 13:51   DG Chest Port 1 View  Result Date: 09/07/2020 CLINICAL DATA:  75 year old female with chest pain. EXAM: PORTABLE CHEST 1 VIEW COMPARISON:  Chest CT 03/23/2019 and earlier. FINDINGS: Portable AP upright view at 0505 hours. Calcified aortic atherosclerosis. Lung volumes and mediastinal contours are within normal limits when allowing for portable  technique. Visualized tracheal air column is within normal limits. Allowing for portable technique the lungs are clear. No pneumothorax or pleural effusion. No acute osseous abnormality identified. IMPRESSION: No acute cardiopulmonary abnormality. Aortic Atherosclerosis (ICD10-I70.0). Electronically Signed   By: Odessa Fleming M.D.   On: 09/07/2020 05:36   ECHOCARDIOGRAM COMPLETE  Result Date: 09/10/2020    ECHOCARDIOGRAM REPORT   Patient Name:   Sarah Francis Date of Exam: 09/10/2020 Medical Rec #:  [id number]    Height:       62.0 in Accession #:    [id number]   Weight:       155.0 lb Date of Birth:  [birth year]    BSA:          1.715 m Patient Age:    75 years     BP:           134/64 mmHg Patient Gender: F            HR:           113 bpm. Exam Location:  Inpatient Procedure: 2D Echo, Color Doppler, Cardiac Doppler and Intracardiac            Opacification Agent Indications:    R07.9* Chest pain, unspecified  History:        Patient has prior history of Echocardiogram examinations, most                 recent 12/01/2018. CAD, PAD; Risk Factors:Hypertension, Diabetes                 and Dyslipidemia.  Sonographer:    Irving Burton Senior RDCS Referring Phys: [id number] Alessandra Bevels IMPRESSIONS  1. Intracavitary gradient. Peak velocity 3.32 m/s. Peak gradient 44.2 mmHg. Left ventricular ejection fraction, by estimation, is 70 to 75%. The left ventricle has hyperdynamic function. The left ventricle has no regional wall motion abnormalities. There is moderate concentric left ventricular hypertrophy. Left ventricular diastolic parameters are consistent with Grade I diastolic dysfunction (impaired relaxation). Elevated left ventricular end-diastolic pressure.  2. Right ventricular systolic function is normal. The right ventricular size is normal. There is normal pulmonary artery systolic pressure.  3. The mitral valve is normal in structure. Trivial mitral valve regurgitation. No evidence of mitral stenosis.  4. The aortic valve is  tricuspid. Aortic valve regurgitation is not visualized. No aortic stenosis is present.  5. The inferior vena cava is normal in size with greater than 50% respiratory variability, suggesting right atrial pressure of 3 mmHg. FINDINGS  Left Ventricle: Intracavitary gradient. Peak velocity 3.32 m/s. Peak gradient 44.2 mmHg. Left ventricular ejection fraction, by estimation, is 70 to 75%. The left ventricle has hyperdynamic function. The left ventricle has no regional wall motion abnormalities. Definity contrast agent was given IV to delineate the left ventricular endocardial borders. The left ventricular internal cavity size was normal in size. There is moderate concentric left ventricular hypertrophy. Left ventricular diastolic  parameters are consistent with Grade I diastolic dysfunction (impaired relaxation). Elevated left ventricular end-diastolic pressure. Right Ventricle: The right ventricular size is normal. No increase in right ventricular wall thickness. Right ventricular systolic function is normal. There is normal pulmonary artery systolic pressure. The tricuspid regurgitant velocity is 2.59 m/s, and  with an assumed right atrial pressure of 3 mmHg, the estimated right ventricular systolic pressure is 29.8 mmHg. Left Atrium: Left atrial size was normal in size. Right Atrium: Right atrial size was normal in size. Pericardium: There is no evidence of pericardial effusion. Mitral Valve: The  mitral valve is normal in structure. Trivial mitral valve regurgitation. No evidence of mitral valve stenosis. Tricuspid Valve: The tricuspid valve is normal in structure. Tricuspid valve regurgitation is trivial. No evidence of tricuspid stenosis. Aortic Valve: The aortic valve is tricuspid. Aortic valve regurgitation is not visualized. No aortic stenosis is present. Pulmonic Valve: The pulmonic valve was normal in structure. Pulmonic valve regurgitation is not visualized. No evidence of pulmonic stenosis. Aorta: The aortic  root is normal in size and structure. Venous: The inferior vena cava is normal in size with greater than 50% respiratory variability, suggesting right atrial pressure of 3 mmHg. IAS/Shunts: No atrial level shunt detected by color flow Doppler.  LEFT VENTRICLE PLAX 2D LVIDd:         3.10 cm  Diastology LVIDs:         2.00 cm  LV e' medial:    4.57 cm/s LV PW:         1.40 cm  LV E/e' medial:  15.4 LV IVS:        1.40 cm  LV e' lateral:   9.25 cm/s LVOT diam:     1.90 cm  LV E/e' lateral: 7.6 LV SV:         90 LV SV Index:   52 LVOT Area:     2.84 cm  RIGHT VENTRICLE RV S prime:     12.80 cm/s TAPSE (M-mode): 2.0 cm LEFT ATRIUM             Index       RIGHT ATRIUM           Index LA diam:        3.60 cm 2.10 cm/m  RA Area:     11.80 cm LA Vol (A2C):   48.2 ml 28.10 ml/m RA Volume:   23.60 ml  13.76 ml/m LA Vol (A4C):   39.0 ml 22.73 ml/m LA Biplane Vol: 44.5 ml 25.94 ml/m  AORTIC VALVE LVOT Vmax:   156.00 cm/s LVOT Vmean:  120.000 cm/s LVOT VTI:    0.317 m  AORTA Ao Root diam: 2.60 cm Ao Asc diam:  2.70 cm MITRAL VALVE                TRICUSPID VALVE MV Area (PHT): 2.99 cm     TR Peak grad:   26.8 mmHg MV Decel Time: 254 msec     TR Vmax:        258.95 cm/s MV E velocity: 70.60 cm/s MV A velocity: 107.00 cm/s  SHUNTS MV E/A ratio:  0.66         Systemic VTI:  0.32 m                             Systemic Diam: 1.90 cm Chilton Si MD Electronically signed by Chilton Si MD Signature Date/Time: 09/10/2020/4:39:43 PM    Final    CT Angio Chest/Abd/Pel for Dissection W and/or Wo Contrast  Result Date: 09/07/2020 CLINICAL DATA:  75 year old female with abdominal pain and chest pain. EXAM: CT ANGIOGRAPHY CHEST, ABDOMEN AND PELVIS TECHNIQUE: Portable chest 0505 hours today. Multidetector CT imaging through the chest, abdomen and pelvis was performed using the standard protocol during bolus administration of intravenous contrast. Multiplanar reconstructed images and MIPs were obtained and reviewed to  evaluate the vascular anatomy. CONTRAST:  75mL OMNIPAQUE IOHEXOL 350 MG/ML SOLN COMPARISON:  Noncontrast chest CT 03/23/2019. FINDINGS: CTA CHEST FINDINGS Cardiovascular: Extensive thoracic  aortic calcified atherosclerosis. Similar coronary artery calcified plaque. No mural hematoma. No thoracic aortic aneurysm or dissection. Proximal great vessel atherosclerosis. High-grade stenosis or occlusion of the right ICA origin is visible on series 6, image 1. Bulky atherosclerosis there on a 2017 cervical spine CT. Cardiac size within normal limits. No pericardial effusion. Central pulmonary arteries also well opacified and appear to be patent. Mediastinum/Nodes: Negative. Lungs/Pleura: Stable lung volumes. Major airways are patent. No pleural effusion. Lungs appear stable since last year, with scattered areas of peribronchial postinflammatory appearing scarring, more macroscopic scarring in the lingula. Mild dependent atelectasis. Musculoskeletal: Chronic thoracic disc and endplate degeneration. No acute osseous abnormality identified. Review of the MIP images confirms the above findings. CTA ABDOMEN AND PELVIS FINDINGS VASCULAR Extensive Aortoiliac calcified atherosclerosis. Negative for abdominal aortic aneurysm or dissection. Major arterial structures in the abdomen and pelvis remain patent. Abundant bilateral femoral artery atherosclerosis, visible proximal femoral arteries remain patent. Review of the MIP images confirms the above findings. NON-VASCULAR Hepatobiliary: 2.2 cm gallstone in the neck of the gallbladder with mildly thickened and indistinct appearance of some of the gallbladder wall (coronal image 53). But no convincing pericholecystic inflammation. Hepatic steatosis. Pancreas: Negative. Spleen: Negative. Adrenals/Urinary Tract: Distended urinary bladder (400 mL). Normal adrenal glands. Kidneys appears symmetric and normal. No hydronephrosis or hydroureter. Stomach/Bowel: Small gastric hiatal hernia is  chronic. Diverticulosis of the descending and sigmoid colon without active inflammation. Otherwise negative. Normal appendix. No dilated bowel. Lymphatic: No lymphadenopathy. Reproductive: Absent uterus.  Ovaries are within normal limits. Other: No pelvic free fluid. Musculoskeletal: Lower lumbar facet arthropathy. No acute osseous abnormality identified. Review of the MIP images confirms the above findings. IMPRESSION: 1. Great vessel atherosclerosis including bulky plaque at the right carotid bifurcation and possible Right ICA Occlusion. In the absence of acute neurologic symptoms a follow-up Carotid Doppler Ultrasound may be the simplest way to evaluate further. Otherwise CTA neck with IV contrast would further characterize. 2. Extensive Aortic Atherosclerosis (ICD10-I70.0). Negative for aneurysm or dissection. 3. Gallbladder wall thickening and a 2.2 cm gallstone in the neck of the gallbladder. But no convincing pericholecystic inflammation. If there is right abdominal pain then follow-up Ultrasound would be valuable. 4. Distended urinary bladder (400 mL). Stable lung scarring. Hepatic steatosis. Small gastric hiatal hernia. Diverticulosis of the descending and sigmoid colon without active inflammation. Electronically Signed   By: Odessa Fleming M.D.   On: 09/07/2020 06:14   US Abdomen Limited RUQ (LIVER/GB)  Result Date: 09/09/2020 CLINICAL DATA:  Chest pain abdominal pain since Sunday EXAM: ULTRASOUND ABDOMEN LIMITED RIGHT UPPER QUADRANT COMPARISON:  Ultrasound 09/07/2020 CT 09/07/2020 FINDINGS: Gallbladder: Gallbladder is nondistended. Several echogenic gallstones are present the lumen of the gallbladder including a 19 mm stone towards the neck of the gallbladder. No pericholecystic fluid. No gallbladder wall thickening. Negative sonographic Murphy's sign. Common bile duct: Diameter: Normal at 3 mm Liver: Liver has uniform increase in echogenicity. There are several hypoechoic regions along the gallbladder fossa  most suggestive of focal fatty sparing. Portal vein is patent on color Doppler imaging with normal direction of blood flow towards the liver. Other: Ascites IMPRESSION: 1. No interval change from ultrasound 09/07/2020. 2. Cholelithiasis without acute cholecystitis. 3. Increased liver echogenicity suggests hepatic steatosis. No significant steatosis noted on comparison CT 09/07/2020. 4. Hypoechoic regions along the gallbladder fossa most suggestive focal fatty sparing. 5. With elevated bilirubin and elevated white blood cell count, consider contrast MRI for further evaluation. Electronically Signed   By: Loura Halt.D.  On: 09/09/2020 15:02   US Abdomen Limited RUQ (LIVER/GB)  Result Date: 09/07/2020 CLINICAL DATA:  75 year old female with right upper quadrant pain. CTA Chest, Abdomen, and Pelvis today demonstrating gallstone and gallbladder wall thickening. EXAM: ULTRASOUND ABDOMEN LIMITED RIGHT UPPER QUADRANT COMPARISON:  CTA 0548 hours today. FINDINGS: Gallbladder: Mild gallbladder wall irregularity, but overall wall thickness at the upper limits of normal-3 mm (image 2). Small gallstone in the fundus is shadowing on image 8. The larger gallstone in the gallbladder neck not as easily identified (on image 11). No pericholecystic fluid. No sonographic Murphy sign elicited. Common bile duct: Diameter: 5 mm, normal. Liver: Echogenic liver (image 32). No discrete liver lesion. No intrahepatic biliary ductal dilatation. Portal vein is patent on color Doppler imaging with normal direction of blood flow towards the liver. Other: Negative visible right kidney. IMPRESSION: 1. Gallstones, including the 2.2 cm gallstone in the neck of the gallbladder, but no strong evidence of acute cholecystitis at this time. No evidence of bile duct obstruction. 2. Fatty liver disease. Electronically Signed   By: Odessa Fleming M.D.   On: 09/07/2020 07:38     Assessment and Plan:   CAD: Continue aggressive secondary prevention.   Given that her stents are more than 73 years old, Brilinta can be held for 5 days prior to surgery.  Since she is on the lower dose of 60 mg , will check with pharmacy as to whether the antiplatelet effect will wear off before 5 days. Preoperative clearance: Mild to moderate perioperative risk of cardiovascular events, mostly related to her age and cardiac history.  There are no modifiable risk factors to lower her risk.  I think she can proceed with surgery without any further cardiac testing.  She understands the small cardiac risk with surgery. Diabetes: After she is recovered, it will be beneficial to get her more regular exercise for her cardiovascular health and to help treat her diabetes. Hypertension: She is on an ACE inhibitor.  Blood pressure has been fairly well controlled.   Risk Assessment/Risk Scores:                For questions or updates, please contact [facility] Please consult [email or web address] for contact info under    Signed, Lance Muss, MD  09/10/2020 5:52 PM

## 2020-09-10 NOTE — Progress Notes (Signed)
PROGRESS NOTE  Sarah Francis  ZOX:[id number] DOB: Feb 21, 1945 DOA: 09/09/2020 PCP: Gweneth Dimitri, MD   Brief Narrative: Sarah Francis is a 75 y.o. female with a history of CAD s/p DES x4 in May 2020 on aspirin, brilinta, PAD, HTN, HLD, T2DM, GERD, and depression/anxiety who presented to the ED, sent from her PCP's office with abdominal and back pain associated with nausea and vomiting. Work up for these complaints on 8/14 in the ED had included CTA chest and CT abd/pelvis which revealed a 2.2cm gallstone in the gallbladder neck with wall thickening without pericholecystic inflammation, hepatic steatosis, small hiatal hernia, diverticulosis without inflammation, and was otherwise reassuring. She was allowed to go home only to have recurrence of symptoms.  In the ED she was afebrile with HTN, leukocytosis (27k), hyponatremia (124), troponin 21 > 19. Surgery was consulted and recommended HIDA scan which showed nonvisualization of the gallbladder. They've requested cardiology risk assessment for preoperative evaluation.  Assessment & Plan: Principal Problem:   Gallstones without obstruction of gallbladder Active Problems:   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HCC)   Mixed hyperlipidemia   Vitamin D deficiency   Restless legs syndrome (RLS)   Essential hypertension   Peripheral arterial disease (HCC)   CAD (coronary artery disease)   Carotid artery stenosis   PAD (peripheral artery disease) (HCC)   HLD (hyperlipidemia)   Abdominal pain   Acute cholecystitis  Acute cholecystitis with cholelithiasis: +HIDA scan today, 8/17.  - Surgery following, requested cardiology consultation for formal risk assessment.  - NPO, IV antiemetics, pain control, abx. - Recheck TBili in AM.   CAD with atypical chest pain: s/p PCI/DES May 2020 by Dr. Excell Seltzer. Troponins negative. ECG with single ectopic PAC, TWI in lateral leads, flattening in lateral precordial leads. Pain could be attributable to cholecystitis.   - Cardiology consulted for risk assessment per surgery request.  - Continue aspirin, holding brilinta for now.  - Continue beta blocker, statin - Echocardiogram pending. Last in Nov 2020 showed LVEF 65-70%, mod LVH w/G2DD  PAD, carotid stenosis: No TIA symptoms reported. - Moderate progression of LE PAD on ABIs Dec 2021 recommended to repeat in 12 months. Right and left ICA 60-79% stenosis on U/S Oct 2021. Repeat in 12 months recommended.   - Routine f/u with Dr. Allyson Sabal. Pt is on DAPT, high-intensity statin.   Hyponatremia:  - Monitor with LR gtt  T2DM: HbA1c 8.7%.  - Continue CBG checks with moderate SSI. Anticipate we'll need to restart some basal insulin based on home dosing of lantus 30u BID, 14u BID humalog as well.   HTN:  - Continue lisinopril, metoprolol  BID  HLD:  - Continue statin  GERD, hiatal hernia:  - Continue PPI  Depression, anxiety: Quiescent  Asymptomatic bacteriuria: Nonclonal Cx.   Hematuria:  - Recommend recheck at follow up given tobacco history.   DVT prophylaxis: Lovenox Code Status: Full Family Communication: None at bedside Disposition Plan:  Status is: Inpatient  Remains inpatient appropriate because:Inpatient level of care appropriate due to severity of illness  Dispo: The patient is from: Home              Anticipated d/c is to: Home              Patient currently is not medically stable to d/c.   Difficult to place patient No  Consultants:  General surgery Cardiology  Procedures:  HIDA 8/17  Antimicrobials: Cipro/flagyl (PCN allergic)   Subjective: Pain has subsided entirely since being NPO. No chest  pain or dyspnea either. Does report claudication symptoms with trips to grocery store. No nausea or vomiting today.   Objective: Vitals:   09/10/20 0022 09/10/20 0311 09/10/20 0836 09/10/20 1428  BP: 136/75 (!) 149/72 134/64 133/68  Pulse: (!) 110 (!) 110 (!) 105 97  Resp: Temp: 98 F (36.7 C) 98 F (36.7 C)  99.1 F (37.3 C) 97.7 F (36.5 C)  TempSrc: Oral Oral Oral Oral  SpO2: 98% 95% 94% 95%  Weight:      Height:        Intake/Output Summary (Last 24 hours) at 09/10/2020 1512 Last data filed at 09/10/2020 1503 Gross per 24 hour  Intake 1910.76 ml  Output --  Net 1910.76 ml   Filed Weights   09/09/20 1148  Weight: 70.3 kg    Gen: 74 y.o. female in no distress  Pulm: Non-labored breathing room air. Clear to auscultation bilaterally.  CV: Regular rate and rhythm. No murmur, rub, or gallop. No JVD, no pedal edema. GI: Abdomen somewhat firm but nontender, non-distended, with normoactive bowel sounds. No organomegaly or masses felt. Ext: Warm, no deformities. Diminished peripheral pulses. Skin: No rashes, lesions or ulcers Neuro: Alert and oriented. No focal neurological deficits. Psych: Judgement and insight appear normal. Mood & affect appropriate.   Data Reviewed: I have personally reviewed following labs and imaging studies  CBC: Recent Labs  Lab 09/07/20 0447 09/07/20 0504 09/09/20 1210 09/09/20 2032 09/10/20 0126  WBC 14.1*  --  27.1* 26.4* 27.8*  NEUTROABS 11.6*  --  25.2*  --   --   HGB 13.9 13.6 14.1 13.1 12.8  HCT 40.7 40.0 40.2 37.3 37.0  MCV 97.6  --  94.1 94.7 94.4  PLT 270  --  248 220 224   Basic Metabolic Panel: Recent Labs  Lab 09/07/20 0447 09/07/20 0504 09/09/20 1210 09/09/20 2032 09/10/20 0126  NA 127* 128* 124* 125* 124*  K 4.3 4.2 3.5 3.5 3.6  CL 94* 94* 89* 92* 91*  CO2 21*  --  22 21* 20*  GLUCOSE 148* 145* 178* 157* 192*  BUN CREATININE 0.69 0.60 0.66 0.60 0.72  CALCIUM 9.2  --  8.9 8.2* 8.2*   GFR: Estimated Creatinine Clearance: 57.5 mL/min (by C-G formula based on SCr of 0.72 mg/dL). Liver Function Tests: Recent Labs  Lab 09/07/20 0447 09/09/20 1210 09/10/20 0126  AST 32 17 25  ALT 43 26 26  ALKPHOS 64 75 93  BILITOT 1.0 1.4* 1.2  PROT 7.0 7.2 6.3*  ALBUMIN 4.0 3.4* 3.0*   Recent Labs  Lab 09/07/20 0447  09/09/20 1210  LIPASE 29 27   No results for input(s): AMMONIA in the last 168 hours. Coagulation Profile: Recent Labs  Lab 09/07/20 0447  INR 0.9   Cardiac Enzymes: No results for input(s): CKTOTAL, CKMB, CKMBINDEX, TROPONINI in the last 168 hours. BNP (last 3 results) No results for input(s): PROBNP in the last 8760 hours. HbA1C: Recent Labs    09/09/20 2032  HGBA1C 8.7*   CBG: Recent Labs  Lab 09/09/20 2246 09/10/20 0746  GLUCAP 157* 203*   Lipid Profile: No results for input(s): CHOL, HDL, LDLCALC, TRIG, CHOLHDL, LDLDIRECT in the last 72 hours. Thyroid Function Tests: No results for input(s): TSH, T4TOTAL, FREET4, T3FREE, THYROIDAB in the last 72 hours. Anemia Panel: No results for input(s): VITAMINB12, FOLATE, FERRITIN, TIBC, IRON, RETICCTPCT in the last 72 hours. Urine analysis:    Component  Value Date/Time   COLORURINE YELLOW 09/09/2020 1403   APPEARANCEUR HAZY (A) 09/09/2020 1403   LABSPEC 1.016 09/09/2020 1403   PHURINE 7.0 09/09/2020 1403   GLUCOSEU 150 (A) 09/09/2020 1403   HGBUR SMALL (A) 09/09/2020 1403   BILIRUBINUR NEGATIVE 09/09/2020 1403   KETONESUR 80 (A) 09/09/2020 1403   PROTEINUR 30 (A) 09/09/2020 1403   NITRITE NEGATIVE 09/09/2020 1403   LEUKOCYTESUR TRACE (A) 09/09/2020 1403   Recent Results (from the past 240 hour(s))  Resp Panel by RT-PCR (Flu A&B, Covid) Nasopharyngeal Swab     Status: None   Collection Time: 09/07/20  4:55 AM   Specimen: Nasopharyngeal Swab; Nasopharyngeal(NP) swabs in vial transport medium  Result Value Ref Range Status   SARS Coronavirus 2 by RT PCR NEGATIVE NEGATIVE Final    Comment: (NOTE) SARS-CoV-2 target nucleic acids are NOT DETECTED.  The SARS-CoV-2 RNA is generally detectable in upper respiratory specimens during the acute phase of infection. The lowest concentration of SARS-CoV-2 viral copies this assay can detect is 138 copies/mL. A negative result does not preclude SARS-Cov-2 infection and should  not be used as the sole basis for treatment or other patient management decisions. A negative result may occur with  improper specimen collection/handling, submission of specimen other than nasopharyngeal swab, presence of viral mutation(s) within the areas targeted by this assay, and inadequate number of viral copies(<138 copies/mL). A negative result must be combined with clinical observations, patient history, and epidemiological information. The expected result is Negative.  Fact Sheet for Patients:  BloggerCourse.com  Fact Sheet for Healthcare Providers:  SeriousBroker.it  This test is no t yet approved or cleared by the [place] FDA and  has been authorized for detection and/or diagnosis of SARS-CoV-2 by FDA under an Emergency Use Authorization (EUA). This EUA will remain  in effect (meaning this test can be used) for the duration of the COVID-19 declaration under Section 564(b)(1) of the Act, 21 U.S.C.section 360bbb-3(b)(1), unless the authorization is terminated  or revoked sooner.       Influenza A by PCR NEGATIVE NEGATIVE Final   Influenza B by PCR NEGATIVE NEGATIVE Final    Comment: (NOTE) The Xpert Xpress SARS-CoV-2/FLU/RSV plus assay is intended as an aid in the diagnosis of influenza from Nasopharyngeal swab specimens and should not be used as a sole basis for treatment. Nasal washings and aspirates are unacceptable for Xpert Xpress SARS-CoV-2/FLU/RSV testing.  Fact Sheet for Patients: BloggerCourse.com  Fact Sheet for Healthcare Providers: SeriousBroker.it  This test is not yet approved or cleared by the [place] FDA and has been authorized for detection and/or diagnosis of SARS-CoV-2 by FDA under an Emergency Use Authorization (EUA). This EUA will remain in effect (meaning this test can be used) for the duration of the COVID-19 declaration under  Section 564(b)(1) of the Act, 21 U.S.C. section 360bbb-3(b)(1), unless the authorization is terminated or revoked.  Performed at [facility], [address], [place], [place] [address]   Urine Culture     Status: Abnormal   Collection Time: 09/09/20  2:37 PM   Specimen: Urine, Clean Catch  Result Value Ref Range Status   Specimen Description URINE, CLEAN CATCH  Final   Special Requests   Final    NONE Performed at [facility], [address], [place], [place] [address]    Culture MULTIPLE SPECIES PRESENT, SUGGEST RECOLLECTION (A)  Final   Report Status 09/10/2020 FINAL  Final  Resp Panel by RT-PCR (Flu A&B, Covid)  Nasopharyngeal Swab     Status: None   Collection Time: 09/09/20  3:38 PM   Specimen: Nasopharyngeal Swab; Nasopharyngeal(NP) swabs in vial transport medium  Result Value Ref Range Status   SARS Coronavirus 2 by RT PCR NEGATIVE NEGATIVE Final    Comment: (NOTE) SARS-CoV-2 target nucleic acids are NOT DETECTED.  The SARS-CoV-2 RNA is generally detectable in upper respiratory specimens during the acute phase of infection. The lowest concentration of SARS-CoV-2 viral copies this assay can detect is 138 copies/mL. A negative result does not preclude SARS-Cov-2 infection and should not be used as the sole basis for treatment or other patient management decisions. A negative result may occur with  improper specimen collection/handling, submission of specimen other than nasopharyngeal swab, presence of viral mutation(s) within the areas targeted by this assay, and inadequate number of viral copies(<138 copies/mL). A negative result must be combined with clinical observations, patient history, and epidemiological information. The expected result is Negative.  Fact Sheet for Patients:  BloggerCourse.com  Fact Sheet for Healthcare Providers:  SeriousBroker.it  This test is no t yet approved or cleared by the  [place] FDA and  has been authorized for detection and/or diagnosis of SARS-CoV-2 by FDA under an Emergency Use Authorization (EUA). This EUA will remain  in effect (meaning this test can be used) for the duration of the COVID-19 declaration under Section 564(b)(1) of the Act, 21 U.S.C.section 360bbb-3(b)(1), unless the authorization is terminated  or revoked sooner.       Influenza A by PCR NEGATIVE NEGATIVE Final   Influenza B by PCR NEGATIVE NEGATIVE Final    Comment: (NOTE) The Xpert Xpress SARS-CoV-2/FLU/RSV plus assay is intended as an aid in the diagnosis of influenza from Nasopharyngeal swab specimens and should not be used as a sole basis for treatment. Nasal washings and aspirates are unacceptable for Xpert Xpress SARS-CoV-2/FLU/RSV testing.  Fact Sheet for Patients: BloggerCourse.com  Fact Sheet for Healthcare Providers: SeriousBroker.it  This test is not yet approved or cleared by the [place] FDA and has been authorized for detection and/or diagnosis of SARS-CoV-2 by FDA under an Emergency Use Authorization (EUA). This EUA will remain in effect (meaning this test can be used) for the duration of the COVID-19 declaration under Section 564(b)(1) of the Act, 21 U.S.C. section 360bbb-3(b)(1), unless the authorization is terminated or revoked.  Performed at [facility], [address], [place], [place] [address]   Culture, blood (routine x 2)     Status: None (Preliminary result)   Collection Time: 09/09/20  8:33 PM   Specimen: BLOOD RIGHT HAND  Result Value Ref Range Status   Specimen Description BLOOD RIGHT HAND  Final   Special Requests   Final    BOTTLES DRAWN AEROBIC AND ANAEROBIC Blood Culture results may not be optimal due to an inadequate volume of blood received in culture bottles   Culture   Final    NO GROWTH < 24 HOURS Performed at [facility], [address], [place],  [place] [address]    Report Status PENDING  Incomplete  Culture, blood (routine x 2)     Status: None (Preliminary result)   Collection Time: 09/10/20  1:20 AM   Specimen: BLOOD RIGHT ARM  Result Value Ref Range Status   Specimen Description BLOOD RIGHT ARM  Final   Special Requests   Final    BOTTLES DRAWN AEROBIC AND ANAEROBIC Blood Culture adequate volume   Culture   Final  NO GROWTH < 12 HOURS Performed at [facility], [address] [place], [place] [address]    Report Status PENDING  Incomplete      Radiology Studies: DG Chest 2 View  Result Date: 09/09/2020 CLINICAL DATA:  Chest pain. EXAM: CHEST - 2 VIEW COMPARISON:  September 07, 2020. FINDINGS: The heart size and mediastinal contours are within normal limits. Both lungs are clear. The visualized skeletal structures are unremarkable. IMPRESSION: No active cardiopulmonary disease. Aortic Atherosclerosis (ICD10-I70.0). Electronically Signed   By: Lupita RaiderJames  Green Jr M.D.   On: 09/09/2020 13:20   NM Hepatobiliary Liver Func  Result Date: 09/10/2020 CLINICAL DATA:  Rule out cholecystitis. EXAM: NUCLEAR MEDICINE HEPATOBILIARY IMAGING TECHNIQUE: Sequential images of the abdomen were obtained out to 60 minutes following intravenous administration of radiopharmaceutical. RADIOPHARMACEUTICALS:  4.85 mCi Tc-373m  Choletec IV COMPARISON:  Abdominal sonogram 05/10/2020. FINDINGS: Prompt uptake and biliary excretion of activity by the liver is seen. Biliary activity passes into small bowel, consistent with patent common bile duct. After 60 minutes of imaging no gallbladder activity was visualized. Patient then received 3 mg of morphine IV, and imaging was carried out for an additional 30 minutes. After 30 minutes no gallbladder activity was visualized. IMPRESSION: Nonvisualization of the gallbladder compatible with cystic duct obstruction due to acute cholecystitis. These results will be called to the ordering clinician or representative by the  Radiologist Assistant, and communication documented in the PACS or [employer] Dashboard. Electronically Signed   By: Signa Kellaylor  Stroud M.D.   On: 09/10/2020 13:51   US Abdomen Limited RUQ (LIVER/GB)  Result Date: 09/09/2020 CLINICAL DATA:  Chest pain abdominal pain since Sunday EXAM: ULTRASOUND ABDOMEN LIMITED RIGHT UPPER QUADRANT COMPARISON:  Ultrasound 09/07/2020 CT 09/07/2020 FINDINGS: Gallbladder: Gallbladder is nondistended. Several echogenic gallstones are present the lumen of the gallbladder including a 19 mm stone towards the neck of the gallbladder. No pericholecystic fluid. No gallbladder wall thickening. Negative sonographic Murphy's sign. Common bile duct: Diameter: Normal at 3 mm Liver: Liver has uniform increase in echogenicity. There are several hypoechoic regions along the gallbladder fossa most suggestive of focal fatty sparing. Portal vein is patent on color Doppler imaging with normal direction of blood flow towards the liver. Other: Ascites IMPRESSION: 1. No interval change from ultrasound 09/07/2020. 2. Cholelithiasis without acute cholecystitis. 3. Increased liver echogenicity suggests hepatic steatosis. No significant steatosis noted on comparison CT 09/07/2020. 4. Hypoechoic regions along the gallbladder fossa most suggestive focal fatty sparing. 5. With elevated bilirubin and elevated white blood cell count, consider contrast MRI for further evaluation. Electronically Signed   By: Genevive BiStewart  Edmunds M.D.   On: 09/09/2020 15:02    Scheduled Meds:  aspirin EC  81 mg Oral Daily   atorvastatin  80 mg Oral QHS   cholecalciferol  2,000 Units Oral Daily   cycloSPORINE  1 drop Both Eyes BID   enoxaparin (LOVENOX) injection  40 mg Subcutaneous Q24H   insulin aspart  0-15 Units Subcutaneous TID WC   insulin aspart  0-5 Units Subcutaneous QHS   lisinopril  5 mg Oral Daily   loratadine  10 mg Oral Daily   metoprolol tartrate  50 mg Oral BID    morphine injection  3 mg Intravenous Once    pantoprazole (PROTONIX) IV  40 mg Intravenous Q12H   thiamine  100 mg Oral Daily   Continuous Infusions:  ciprofloxacin 400 mg (09/10/20 0429)   And   metronidazole Stopped (09/10/20 0916)   lactated ringers Stopped (09/10/20  1226)     LOS: 0 days   Time spent: 25 minutes.  Tyrone Nine, MD [facility] [email or web address] 09/10/2020, 3:12 PM

## 2020-09-10 NOTE — Progress Notes (Signed)
Echocardiogram 2D Echocardiogram has been performed.  Warren Lacy Adama Ivins RDCS 09/10/2020, 2:32 PM

## 2020-09-10 NOTE — Progress Notes (Signed)
Subjective: CC: No abdominal pain this morning. Was having LUQ pain, n/v yesterday evening. Denies cp or sob.   Objective: Vital signs in last 24 hours: Temp:  [97.7 F (36.5 C)-99.1 F (37.3 C)] 99.1 F (37.3 C) (08/17 0836) Pulse Rate:  [70-115] 105 (08/17 0836) Resp:  [14-31] 18 (08/17 0836) BP: (134-211)/(61-87) 134/64 (08/17 0836) SpO2:  [94 %-99 %] 94 % (08/17 0836) Weight:  [70.3 kg] 70.3 kg (08/16 1148) Last BM Date: 09/07/20  Intake/Output from previous day: 08/16 0701 - 08/17 0700 In: 1405.4 [I.V.:605.4; IV Piggyback:800] Out: 200 [Urine:200] Intake/Output this shift: No intake/output data recorded.  PE: Gen:  Alert, NAD, pleasant Card:  RRR Pulm:  CTAB, no W/R/R, effort normal Abd: Soft, ND, NT +BS Ext:  No LE edema or calf tenderness Psych: A&Ox3  Skin: no rashes noted, warm and dry  Lab Results:  Recent Labs    09/09/20 2032 09/10/20 0126  WBC 26.4* 27.8*  HGB 13.1 12.8  HCT 37.3 37.0  PLT 220 224   BMET Recent Labs    09/09/20 2032 09/10/20 0126  NA 125* 124*  K 3.5 3.6  CL 92* 91*  CO2 21* 20*  GLUCOSE 157* 192*  BUN 8 8  CREATININE 0.60 0.72  CALCIUM 8.2* 8.2*   PT/INR No results for input(s): LABPROT, INR in the last 72 hours. CMP     Component Value Date/Time   NA 124 (L) 09/10/2020 0126   NA 136 07/20/2018 1348   K 3.6 09/10/2020 0126   CL 91 (L) 09/10/2020 0126   CO2 20 (L) 09/10/2020 0126   GLUCOSE 192 (H) 09/10/2020 0126   BUN 8 09/10/2020 0126   BUN 9 07/20/2018 1348   CREATININE 0.72 09/10/2020 0126   CALCIUM 8.2 (L) 09/10/2020 0126   PROT 6.3 (L) 09/10/2020 0126   ALBUMIN 3.0 (L) 09/10/2020 0126   AST 25 09/10/2020 0126   ALT 26 09/10/2020 0126   ALKPHOS 93 09/10/2020 0126   BILITOT 1.2 09/10/2020 0126   GFRNONAA >60 09/10/2020 0126   GFRAA >60 12/07/2018 1557   Lipase     Component Value Date/Time   LIPASE 27 09/09/2020 1210    Studies/Results: DG Chest 2 View  Result Date:  09/09/2020 CLINICAL DATA:  Chest pain. EXAM: CHEST - 2 VIEW COMPARISON:  September 07, 2020. FINDINGS: The heart size and mediastinal contours are within normal limits. Both lungs are clear. The visualized skeletal structures are unremarkable. IMPRESSION: No active cardiopulmonary disease. Aortic Atherosclerosis (ICD10-I70.0). Electronically Signed   By: Lupita Raider M.D.   On: 09/09/2020 13:20   US Abdomen Limited RUQ (LIVER/GB)  Result Date: 09/09/2020 CLINICAL DATA:  Chest pain abdominal pain since Sunday EXAM: ULTRASOUND ABDOMEN LIMITED RIGHT UPPER QUADRANT COMPARISON:  Ultrasound 09/07/2020 CT 09/07/2020 FINDINGS: Gallbladder: Gallbladder is nondistended. Several echogenic gallstones are present the lumen of the gallbladder including a 19 mm stone towards the neck of the gallbladder. No pericholecystic fluid. No gallbladder wall thickening. Negative sonographic Murphy's sign. Common bile duct: Diameter: Normal at 3 mm Liver: Liver has uniform increase in echogenicity. There are several hypoechoic regions along the gallbladder fossa most suggestive of focal fatty sparing. Portal vein is patent on color Doppler imaging with normal direction of blood flow towards the liver. Other: Ascites IMPRESSION: 1. No interval change from ultrasound 09/07/2020. 2. Cholelithiasis without acute cholecystitis. 3. Increased liver echogenicity suggests hepatic steatosis. No significant steatosis noted on comparison CT 09/07/2020. 4. Hypoechoic regions along  the gallbladder fossa most suggestive focal fatty sparing. 5. With elevated bilirubin and elevated white blood cell count, consider contrast MRI for further evaluation. Electronically Signed   By: Genevive Bi M.D.   On: 09/09/2020 15:02    Anti-infectives: Anti-infectives (From admission, onward)    Start     Dose/Rate Route Frequency Ordered Stop   09/10/20 0400  ciprofloxacin (CIPRO) IVPB 400 mg       See Hyperspace for full Linked Orders Report.   400  mg 200 mL/hr over 60 Minutes Intravenous Every 12 hours 09/09/20 1844     09/10/20 0015  metroNIDAZOLE (FLAGYL) IVPB 500 mg       See Hyperspace for full Linked Orders Report.   500 mg 100 mL/hr over 60 Minutes Intravenous Every 8 hours 09/09/20 1844     09/09/20 1530  ciprofloxacin (CIPRO) IVPB 400 mg       See Hyperspace for full Linked Orders Report.   400 mg 200 mL/hr over 60 Minutes Intravenous  Once 09/09/20 1515 09/09/20 1814   09/09/20 1530  metroNIDAZOLE (FLAGYL) IVPB 500 mg       See Hyperspace for full Linked Orders Report.   500 mg 100 mL/hr over 60 Minutes Intravenous  Once 09/09/20 1515 09/09/20 1814        Assessment/Plan Left-sided abdominal pain/chest pain, gallstones - The patient's story and symptoms are not totally c/w biliary disease.  Most of her pain was in her left upper quadrant, behind her left breast going to her back.  Her pain has resolved. She does have large gallstone on her [place], but her gallbladder is contracted which is somewhat suggestive that she doesn't have cholecystitis. LFT's wnl this am. WBC 27.8. We will continue with plan to proceed with a HIDA scan.  This will definitively rule in or out her gallbladder/cholecystitis.  In the interim, her Brilinta should continue to be held for at least 5 days prior to surgical intervention.  She also has bulky plaque disease and R ICA occlusion of her carotid on recent CTA of her neck.  This will need to be evaluated prior to any possible surgery as her risk for CVA would be definitely increased. On abx which can be continued till HIDA results. Would recommend cardiology consult for risk stratification.    FEN - NPO for HIDA tomorrow VTE - hold Brilinta ID - Cipro/Flagyl   R ICA occlusion with carotid artery stenosis CAD, s/p stenting on Brilinta PAD DM Tobacco abuse HTN HLD GERD   LOS: 0 days    Jacinto Halim , [facility] 09/10/2020, 9:13 AM Please see Amion for pager number  during day hours 7:00am-4:30pm

## 2020-09-10 NOTE — Progress Notes (Signed)
  Echocardiogram 2D Echocardiogram has been re-attempted Patient OTF.  Sarah Francis 09/10/2020, 12:28 PM

## 2020-09-10 NOTE — Progress Notes (Signed)
  Echocardiogram 2D Echocardiogram has been attempted. Patient receiving personal care. Will reattempt at later time.  Sarah Francis G Moraima Burd 09/10/2020, 10:09 AM

## 2020-09-11 DIAGNOSIS — K8000 Calculus of gallbladder with acute cholecystitis without obstruction: Secondary | ICD-10-CM | POA: Diagnosis not present

## 2020-09-11 LAB — COMPREHENSIVE METABOLIC PANEL
ALT: 25 U/L (ref 0–44)
AST: 23 U/L (ref 15–41)
Albumin: 2.6 g/dL — ABNORMAL LOW (ref 3.5–5.0)
Alkaline Phosphatase: 85 U/L (ref 38–126)
Anion gap: 13 (ref 5–15)
BUN: 7 mg/dL — ABNORMAL LOW (ref 8–23)
CO2: 24 mmol/L (ref 22–32)
Calcium: 8.5 mg/dL — ABNORMAL LOW (ref 8.9–10.3)
Chloride: 88 mmol/L — ABNORMAL LOW (ref 98–111)
Creatinine, Ser: 0.71 mg/dL (ref 0.44–1.00)
GFR, Estimated: >60 mL/min (ref >60)
Glucose, Bld: 128 mg/dL — ABNORMAL HIGH (ref 70–99)
Potassium: 3.7 mmol/L (ref 3.5–5.1)
Sodium: 125 mmol/L — ABNORMAL LOW (ref 135–145)
Total Bilirubin: 0.9 mg/dL (ref 0.3–1.2)
Total Protein: 5.9 g/dL — ABNORMAL LOW (ref 6.5–8.1)

## 2020-09-11 LAB — CBC
HCT: 35.3 % — ABNORMAL LOW (ref 36.0–46.0)
Hemoglobin: 12.7 g/dL (ref 12.0–15.0)
MCH: 33.2 pg (ref 26.0–34.0)
MCHC: 36 g/dL (ref 30.0–36.0)
MCV: 92.2 fL (ref 80.0–100.0)
Platelets: 230 10*3/uL (ref 150–400)
RBC: 3.83 MIL/uL — ABNORMAL LOW (ref 3.87–5.11)
RDW: 13.1 % (ref 11.5–15.5)
WBC: 24.2 10*3/uL — ABNORMAL HIGH (ref 4.0–10.5)
nRBC: 0 % (ref 0.0–0.2)

## 2020-09-11 LAB — GLUCOSE, CAPILLARY
Glucose-Capillary: 192 mg/dL — ABNORMAL HIGH (ref 70–99)
Glucose-Capillary: 212 mg/dL — ABNORMAL HIGH (ref 70–99)
Glucose-Capillary: 126 mg/dL — ABNORMAL HIGH (ref 70–99)
Glucose-Capillary: 279 mg/dL — ABNORMAL HIGH (ref 70–99)

## 2020-09-11 LAB — SURGICAL PCR SCREEN
MRSA, PCR: NEGATIVE
Staphylococcus aureus: NEGATIVE

## 2020-09-11 LAB — ECHOCARDIOGRAM COMPLETE
Area-P 1/2: 2.99 cm2
Height: 62 in
S' Lateral: 2 cm
Weight: 2480 oz

## 2020-09-11 LAB — NA AND K (SODIUM & POTASSIUM), RAND UR
Potassium Urine: 28 mmol/L
Sodium, Ur: 34 mmol/L

## 2020-09-11 LAB — OSMOLALITY, URINE: Osmolality, Ur: 329 mOsm/kg (ref 300–900)

## 2020-09-11 NOTE — Progress Notes (Signed)
Subjective: CC: No abdominal pain, n/v. Doesn't like cld options but tolerating.   Objective: Vital signs in last 24 hours: Temp:  [97.5 F (36.4 C)-98.7 F (37.1 C)] 98 F (36.7 C) (08/18 0803) Pulse Rate:  [79-97] 85 (08/18 0803) Resp:  [15-20] 16 (08/18 0803) BP: (131-138)/(57-71) 131/61 (08/18 0803) SpO2:  [95 %-100 %] 96 % (08/18 0803) Last BM Date: 09/07/20  Intake/Output from previous day: 08/17 0701 - 08/18 0700 In: 994.2 [I.V.:412.9; IV Piggyback:581.3] Out: -  Intake/Output this shift: No intake/output data recorded.  PE: Gen:  Alert, NAD, pleasant Card:  RRR Pulm:  CTAB, no W/R/R, effort normal Abd: Soft, ND, NT +BS Ext:  No LE edema or calf tenderness Psych: A&Ox3  Skin: no rashes noted, warm and dry  Lab Results:  Recent Labs    09/10/20 0126 09/11/20 0351  WBC 27.8* 24.2*  HGB 12.8 12.7  HCT 37.0 35.3*  PLT 224 230   BMET Recent Labs    09/10/20 0126 09/11/20 0351  NA 124* 125*  K 3.6 3.7  CL 91* 88*  CO2 20* 24  GLUCOSE 192* 128*  BUN 8 7*  CREATININE 0.72 0.71  CALCIUM 8.2* 8.5*   PT/INR No results for input(s): LABPROT, INR in the last 72 hours. CMP     Component Value Date/Time   NA 125 (L) 09/11/2020 0351   NA 136 07/20/2018 1348   K 3.7 09/11/2020 0351   CL 88 (L) 09/11/2020 0351   CO2 24 09/11/2020 0351   GLUCOSE 128 (H) 09/11/2020 0351   BUN 7 (L) 09/11/2020 0351   BUN 9 07/20/2018 1348   CREATININE 0.71 09/11/2020 0351   CALCIUM 8.5 (L) 09/11/2020 0351   PROT 5.9 (L) 09/11/2020 0351   ALBUMIN 2.6 (L) 09/11/2020 0351   AST 23 09/11/2020 0351   ALT 25 09/11/2020 0351   ALKPHOS 85 09/11/2020 0351   BILITOT 0.9 09/11/2020 0351   GFRNONAA >60 09/11/2020 0351   GFRAA >60 12/07/2018 1557   Lipase     Component Value Date/Time   LIPASE 27 09/09/2020 1210    Studies/Results: DG Chest 2 View  Result Date: 09/09/2020 CLINICAL DATA:  Chest pain. EXAM: CHEST - 2 VIEW COMPARISON:  September 07, 2020. FINDINGS:  The heart size and mediastinal contours are within normal limits. Both lungs are clear. The visualized skeletal structures are unremarkable. IMPRESSION: No active cardiopulmonary disease. Aortic Atherosclerosis (ICD10-I70.0). Electronically Signed   By: Lupita RaiderJames  Green Jr M.D.   On: 09/09/2020 13:20   NM Hepatobiliary Liver Func  Result Date: 09/10/2020 CLINICAL DATA:  Rule out cholecystitis. EXAM: NUCLEAR MEDICINE HEPATOBILIARY IMAGING TECHNIQUE: Sequential images of the abdomen were obtained out to 60 minutes following intravenous administration of radiopharmaceutical. RADIOPHARMACEUTICALS:  4.85 mCi Tc-725m  Choletec IV COMPARISON:  Abdominal sonogram 05/10/2020. FINDINGS: Prompt uptake and biliary excretion of activity by the liver is seen. Biliary activity passes into small bowel, consistent with patent common bile duct. After 60 minutes of imaging no gallbladder activity was visualized. Patient then received 3 mg of morphine IV, and imaging was carried out for an additional 30 minutes. After 30 minutes no gallbladder activity was visualized. IMPRESSION: Nonvisualization of the gallbladder compatible with cystic duct obstruction due to acute cholecystitis. These results will be called to the ordering clinician or representative by the Radiologist Assistant, and communication documented in the PACS or [employer] Dashboard. Electronically Signed   By: Signa Kellaylor  Stroud M.D.   On: 09/10/2020 13:51  ECHOCARDIOGRAM COMPLETE  Result Date: 09/10/2020    ECHOCARDIOGRAM REPORT   Patient Name:   Sarah Francis Date of Exam: 09/10/2020 Medical Rec #:  [id number]    Height:       62.0 in Accession #:    [id number]   Weight:       155.0 lb Date of Birth:  12-14-1945    BSA:          1.715 m Patient Age:    75 years     BP:           134/64 mmHg Patient Gender: F            HR:           113 bpm. Exam Location:  Inpatient Procedure: 2D Echo, Color Doppler, Cardiac Doppler and Intracardiac            Opacification Agent  Indications:    R07.9* Chest pain, unspecified  History:        Patient has prior history of Echocardiogram examinations, most                 recent 12/01/2018. CAD, PAD; Risk Factors:Hypertension, Diabetes                 and Dyslipidemia.  Sonographer:    Irving Burton Senior RDCS Referring Phys: [id number] Alessandra Bevels IMPRESSIONS  1. Intracavitary gradient. Peak velocity 3.32 m/s. Peak gradient 44.2 mmHg. Left ventricular ejection fraction, by estimation, is 70 to 75%. The left ventricle has hyperdynamic function. The left ventricle has no regional wall motion abnormalities. There is moderate concentric left ventricular hypertrophy. Left ventricular diastolic parameters are consistent with Grade I diastolic dysfunction (impaired relaxation). Elevated left ventricular end-diastolic pressure.  2. Right ventricular systolic function is normal. The right ventricular size is normal. There is normal pulmonary artery systolic pressure.  3. The mitral valve is normal in structure. Trivial mitral valve regurgitation. No evidence of mitral stenosis.  4. The aortic valve is tricuspid. Aortic valve regurgitation is not visualized. No aortic stenosis is present.  5. The inferior vena cava is normal in size with greater than 50% respiratory variability, suggesting right atrial pressure of 3 mmHg. FINDINGS  Left Ventricle: Intracavitary gradient. Peak velocity 3.32 m/s. Peak gradient 44.2 mmHg. Left ventricular ejection fraction, by estimation, is 70 to 75%. The left ventricle has hyperdynamic function. The left ventricle has no regional wall motion abnormalities. Definity contrast agent was given IV to delineate the left ventricular endocardial borders. The left ventricular internal cavity size was normal in size. There is moderate concentric left ventricular hypertrophy. Left ventricular diastolic  parameters are consistent with Grade I diastolic dysfunction (impaired relaxation). Elevated left ventricular end-diastolic pressure.  Right Ventricle: The right ventricular size is normal. No increase in right ventricular wall thickness. Right ventricular systolic function is normal. There is normal pulmonary artery systolic pressure. The tricuspid regurgitant velocity is 2.59 m/s, and  with an assumed right atrial pressure of 3 mmHg, the estimated right ventricular systolic pressure is 29.8 mmHg. Left Atrium: Left atrial size was normal in size. Right Atrium: Right atrial size was normal in size. Pericardium: There is no evidence of pericardial effusion. Mitral Valve: The mitral valve is normal in structure. Trivial mitral valve regurgitation. No evidence of mitral valve stenosis. Tricuspid Valve: The tricuspid valve is normal in structure. Tricuspid valve regurgitation is trivial. No evidence of tricuspid stenosis. Aortic Valve: The aortic valve is tricuspid. Aortic valve regurgitation is not visualized. No aortic  stenosis is present. Pulmonic Valve: The pulmonic valve was normal in structure. Pulmonic valve regurgitation is not visualized. No evidence of pulmonic stenosis. Aorta: The aortic root is normal in size and structure. Venous: The inferior vena cava is normal in size with greater than 50% respiratory variability, suggesting right atrial pressure of 3 mmHg. IAS/Shunts: No atrial level shunt detected by color flow Doppler.  LEFT VENTRICLE PLAX 2D LVIDd:         3.10 cm  Diastology LVIDs:         2.00 cm  LV e' medial:    4.57 cm/s LV PW:         1.40 cm  LV E/e' medial:  15.4 LV IVS:        1.40 cm  LV e' lateral:   9.25 cm/s LVOT diam:     1.90 cm  LV E/e' lateral: 7.6 LV SV:         90 LV SV Index:   52 LVOT Area:     2.84 cm  RIGHT VENTRICLE RV S prime:     12.80 cm/s TAPSE (M-mode): 2.0 cm LEFT ATRIUM             Index       RIGHT ATRIUM           Index LA diam:        3.60 cm 2.10 cm/m  RA Area:     11.80 cm LA Vol (A2C):   48.2 ml 28.10 ml/m RA Volume:   23.60 ml  13.76 ml/m LA Vol (A4C):   39.0 ml 22.73 ml/m LA Biplane Vol:  44.5 ml 25.94 ml/m  AORTIC VALVE LVOT Vmax:   156.00 cm/s LVOT Vmean:  120.000 cm/s LVOT VTI:    0.317 m  AORTA Ao Root diam: 2.60 cm Ao Asc diam:  2.70 cm MITRAL VALVE                TRICUSPID VALVE MV Area (PHT): 2.99 cm     TR Peak grad:   26.8 mmHg MV Decel Time: 254 msec     TR Vmax:        258.95 cm/s MV E velocity: 70.60 cm/s MV A velocity: 107.00 cm/s  SHUNTS MV E/A ratio:  0.66         Systemic VTI:  0.32 m                             Systemic Diam: 1.90 cm Chilton Si MD Electronically signed by Chilton Si MD Signature Date/Time: 09/10/2020/4:39:43 PM    Final    US Abdomen Limited RUQ (LIVER/GB)  Result Date: 09/09/2020 CLINICAL DATA:  Chest pain abdominal pain since Sunday EXAM: ULTRASOUND ABDOMEN LIMITED RIGHT UPPER QUADRANT COMPARISON:  Ultrasound 09/07/2020 CT 09/07/2020 FINDINGS: Gallbladder: Gallbladder is nondistended. Several echogenic gallstones are present the lumen of the gallbladder including a 19 mm stone towards the neck of the gallbladder. No pericholecystic fluid. No gallbladder wall thickening. Negative sonographic Murphy's sign. Common bile duct: Diameter: Normal at 3 mm Liver: Liver has uniform increase in echogenicity. There are several hypoechoic regions along the gallbladder fossa most suggestive of focal fatty sparing. Portal vein is patent on color Doppler imaging with normal direction of blood flow towards the liver. Other: Ascites IMPRESSION: 1. No interval change from ultrasound 09/07/2020. 2. Cholelithiasis without acute cholecystitis. 3. Increased liver echogenicity suggests hepatic steatosis. No significant steatosis noted on comparison CT  09/07/2020. 4. Hypoechoic regions along the gallbladder fossa most suggestive focal fatty sparing. 5. With elevated bilirubin and elevated white blood cell count, consider contrast MRI for further evaluation. Electronically Signed   By: Genevive Bi M.D.   On: 09/09/2020 15:02    Anti-infectives: Anti-infectives (From  admission, onward)    Start     Dose/Rate Route Frequency Ordered Stop   09/10/20 0400  ciprofloxacin (CIPRO) IVPB 400 mg       See Hyperspace for full Linked Orders Report.   400 mg 200 mL/hr over 60 Minutes Intravenous Every 12 hours 09/09/20 1844     09/10/20 0015  metroNIDAZOLE (FLAGYL) IVPB 500 mg       See Hyperspace for full Linked Orders Report.   500 mg 100 mL/hr over 60 Minutes Intravenous Every 8 hours 09/09/20 1844     09/09/20 1530  ciprofloxacin (CIPRO) IVPB 400 mg       See Hyperspace for full Linked Orders Report.   400 mg 200 mL/hr over 60 Minutes Intravenous  Once 09/09/20 1515 09/09/20 1814   09/09/20 1530  metroNIDAZOLE (FLAGYL) IVPB 500 mg       See Hyperspace for full Linked Orders Report.   500 mg 100 mL/hr over 60 Minutes Intravenous  Once 09/09/20 1515 09/09/20 1814        Assessment/Plan Acute Cholecystitis  LUQ abdominal pain - HIDA positive - WBC 24.2 (dwon from 27.8). LFT's wnl. Trend labs.  - Cont abx - Appreciate cards recs. They report patient is mild to moderate risk. They believe she can proceed with surgery without any further cardiac testing. - Cont to hold Brilinta for at least 5 days prior to surgical intervention.   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

## 2020-09-11 NOTE — Progress Notes (Signed)
PROGRESS NOTE    Sarah Francis  ZOX:[id number] DOB: Jun 05, 1945 DOA: 09/09/2020 PCP: Gweneth Dimitri, MD   Chief Complaint  Patient presents with   Chest Pain   Brief Narrative: 75 year old female with history of CAD DES x4 in May 2020 on aspirin and Brilinta, PAD, HTN, HLD, GERD, T2DM, anxiety/depression sent from PCP office due to abdominal pain, back pain nausea vomiting. She was initially seen in the ED 8/14 and had  CTA chest and CT abd/pelvis which revealed a 2.2cm gallstone in the gallbladder neck with wall thickening without pericholecystic inflammation, hepatic steatosis, small hiatal hernia, diverticulosis without inflammation, and was otherwise reassuring. She was allowed to go home only to have recurrence of symptoms. In the ED afebrile, with leukocytosis, hyponatremia troponin 21> 19, surgery consulted underwent HIDA scan that was positive with nonvisualization of gallbladder, cardio was consulted for preop risk stratification and admitted under hospitalist for further management  Subjective:  Complains of some soreness on palpation on the right upper quadrant.  Otherwise no new complaints.  Wanting to eat  Assessment & Plan:  Acute cholecystitis in the setting of cholelithiasis: Positive HIDA scan.  Surgery following, seen by cardiology preop and potentially planning for surgery after Brilinta washout on Saturday.  Continue IV fluids, Cipro/Flagyl.  Continue to hold Brilinta.  Leukocytosis improving, AST ALT remained stable total bili normal. plan as per surgery.  Hyponatremia: Has been in 124 this morning 125.  Recently running 127. Sodium was normal at 134 11/2018. Check urine osmol, sod, tsh. continue IV fluid hydration.  Function is stable. Recent Labs  Lab 09/07/20 0504 09/09/20 1210 09/09/20 2032 09/10/20 0126 09/11/20 0351  NA 128* 124* 125* 124* 125*    CAD with a stent PCI/DES May 2020, troponin negative in the ED, EKG PA-C T wave changes seen by cardiology  Brilinta on hold, continue aspirin, beta-blocker. Patient on 60 mg of Brilinta so cards checking P2 Y 12 platelet inhibition test.  B/L Carotid artery disease: patient reports she is followed by her doctor, has US carotid 10/2019-reviewed by Dr Lawernce Keas showed bilateral 60 to 70% carotid stenosis, no intervention needed per Dr Delton See that time- we sent message to Dr Izora Ribas if anything needs to be done preop.  PVD HLD HTN BP stable.  Continue lisinopril, metoprolol Lipitor aspirin  T2DM: Sugar is stable.  Keep on sliding scale insulin. Recent Labs  Lab 09/10/20 0746 09/10/20 1644 09/10/20 2030 09/11/20 0800 09/11/20 1253  GLUCAP 203* 216* 219* 192* 212*   Overweight with BMI 28:Weight loss will be beneficial  Diet Order             Diet full liquid Room service appropriate? Yes; Fluid consistency: Thin  Diet effective now                 Patient's Body mass index is 28.35 kg/m. DVT prophylaxis: enoxaparin (LOVENOX) injection 40 mg Start: 09/09/20 2200 Code Status:   Code Status: Full Code  Family Communication: plan of care discussed with patient at bedside. Status is: Inpatient Remains inpatient appropriate because:IV treatments appropriate due to intensity of illness or inability to take PO and Inpatient level of care appropriate due to severity of illness Dispo: The patient is from: Home              Anticipated d/c is to: Home              Patient currently is not medically stable to d/c.   Difficult to place patient No Unresulted  Labs (From admission, onward)     Start     Ordered   09/16/20 0500  Creatinine, serum  (enoxaparin (LOVENOX)    CrCl >/= 30 ml/min)  Weekly,   R     Comments: while on enoxaparin therapy    09/09/20 1802   09/12/20 0500  CBC  Tomorrow morning,   R        09/11/20 0930   09/12/20 0500  Comprehensive metabolic panel  Tomorrow morning,   R        09/11/20 0930   09/11/20 1040  Platelet inhibition p2y12 (Not at [facility])  ONCE -  STAT,   STAT        09/11/20 1039           Medications reviewed:  Scheduled Meds:  aspirin EC  81 mg Oral Daily   atorvastatin  80 mg Oral QHS   cholecalciferol  2,000 Units Oral Daily   cycloSPORINE  1 drop Both Eyes BID   enoxaparin (LOVENOX) injection  40 mg Subcutaneous Q24H   insulin aspart  0-15 Units Subcutaneous TID WC   insulin aspart  0-5 Units Subcutaneous QHS   lisinopril  5 mg Oral Daily   loratadine  10 mg Oral Daily   metoprolol tartrate  50 mg Oral BID   pantoprazole (PROTONIX) IV  40 mg Intravenous Q12H   thiamine  100 mg Oral Daily   Continuous Infusions:  ciprofloxacin 200 mL/hr at 09/11/20 0444   And   metronidazole 500 mg (09/11/20 0815)   lactated ringers Stopped (09/11/20 0431)   Consultants:see note  Procedures:see note Antimicrobials: Anti-infectives (From admission, onward)    Start     Dose/Rate Route Frequency Ordered Stop   09/10/20 0400  ciprofloxacin (CIPRO) IVPB 400 mg       See Hyperspace for full Linked Orders Report.   400 mg 200 mL/hr over 60 Minutes Intravenous Every 12 hours 09/09/20 1844     09/10/20 0015  metroNIDAZOLE (FLAGYL) IVPB 500 mg       See Hyperspace for full Linked Orders Report.   500 mg 100 mL/hr over 60 Minutes Intravenous Every 8 hours 09/09/20 1844     09/09/20 1530  ciprofloxacin (CIPRO) IVPB 400 mg       See Hyperspace for full Linked Orders Report.   400 mg 200 mL/hr over 60 Minutes Intravenous  Once 09/09/20 1515 09/09/20 1814   09/09/20 1530  metroNIDAZOLE (FLAGYL) IVPB 500 mg       See Hyperspace for full Linked Orders Report.   500 mg 100 mL/hr over 60 Minutes Intravenous  Once 09/09/20 1515 09/09/20 1814      Culture/Microbiology    Component Value Date/Time   SDES BLOOD RIGHT ARM 09/10/2020 0120   SPECREQUEST  09/10/2020 0120    BOTTLES DRAWN AEROBIC AND ANAEROBIC Blood Culture adequate volume   CULT  09/10/2020 0120    NO GROWTH 1 DAY Performed at [facility], [address]  [place], [place] [address]    REPTSTATUS PENDING 09/10/2020 0120    Other culture-see note  Objective: Vitals: Today's Vitals   09/11/20 0426 09/11/20 0803 09/11/20 0821 09/11/20 1029  BP: (!) 138/57 131/61  (!) 138/59  Pulse: 81 85  89  Resp: 15 16    Temp: (!) 97.5 F (36.4 C) 98 F (36.7 C)    TempSrc: Oral Oral    SpO2: 97% 96%    Weight:      Height:  PainSc:   Asleep     Intake/Output Summary (Last 24 hours) at 09/11/2020 1446 Last data filed at 09/11/2020 0444 Gross per 24 hour  Intake 994.18 ml  Output --  Net 994.18 ml   Filed Weights   09/09/20 1148  Weight: 70.3 kg   Weight change:   Intake/Output from previous day: 08/17 0701 - 08/18 0700 In: 994.2 [I.V.:412.9; IV Piggyback:581.3] Out: -  Intake/Output this shift: No intake/output data recorded. Filed Weights   09/09/20 1148  Weight: 70.3 kg   Examination: General exam: AAO x3, pleasant HEENT:Oral mucosa moist, Ear/Nose WNL grossly,dentition normal. Respiratory system: bilaterally diminished,no use of accessory muscle, non tender. Cardiovascular system: S1 & S2 +,No JVD. Gastrointestinal system: Abdomen soft, tender RUQ. Nervous System:Alert, awake, moving extremities Extremities: no edema, distal peripheral pulses palpable.  Skin: No rashes,no icterus. MSK: Normal muscle bulk,tone, power Data Reviewed: I have personally reviewed following labs and imaging studies CBC: Recent Labs  Lab 09/07/20 0447 09/07/20 0504 09/09/20 1210 09/09/20 2032 09/10/20 0126 09/11/20 0351  WBC 14.1*  --  27.1* 26.4* 27.8* 24.2*  NEUTROABS 11.6*  --  25.2*  --   --   --   HGB 13.9 13.6 14.1 13.1 12.8 12.7  HCT 40.7 40.0 40.2 37.3 37.0 35.3*  MCV 97.6  --  94.1 94.7 94.4 92.2  PLT 270  --  248 220 224 230   Basic Metabolic Panel: Recent Labs  Lab 09/07/20 0447 09/07/20 0504 09/09/20 1210 09/09/20 2032 09/10/20 0126 09/11/20 0351  NA 127* 128* 124* 125* 124* 125*  K 4.3 4.2 3.5 3.5 3.6 3.7  CL 94*  94* 89* 92* 91* 88*  CO2 21*  --  22 21* 20* 24  GLUCOSE 148* 145* 178* 157* 192* 128*  BUN 13 15 9 8 8  7*  CREATININE 0.69 0.60 0.66 0.60 0.72 0.71  CALCIUM 9.2  --  8.9 8.2* 8.2* 8.5*   GFR: Estimated Creatinine Clearance: 57.5 mL/min (by C-G formula based on SCr of 0.71 mg/dL). Liver Function Tests: Recent Labs  Lab 09/07/20 0447 09/09/20 1210 09/10/20 0126 09/11/20 0351  AST 32 17 25 23   ALT 43 26 26 25   ALKPHOS 64 75 93 85  BILITOT 1.0 1.4* 1.2 0.9  PROT 7.0 7.2 6.3* 5.9*  ALBUMIN 4.0 3.4* 3.0* 2.6*   Recent Labs  Lab 09/07/20 0447 09/09/20 1210  LIPASE 29 27   No results for input(s): AMMONIA in the last 168 hours. Coagulation Profile: Recent Labs  Lab 09/07/20 0447  INR 0.9   Cardiac Enzymes: No results for input(s): CKTOTAL, CKMB, CKMBINDEX, TROPONINI in the last 168 hours. BNP (last 3 results) No results for input(s): PROBNP in the last 8760 hours. HbA1C: Recent Labs    09/09/20 2032  HGBA1C 8.7*   CBG: Recent Labs  Lab 09/10/20 0746 09/10/20 1644 09/10/20 2030 09/11/20 0800 09/11/20 1253  GLUCAP 203* 216* 219* 192* 212*   Lipid Profile: No results for input(s): CHOL, HDL, LDLCALC, TRIG, CHOLHDL, LDLDIRECT in the last 72 hours. Thyroid Function Tests: No results for input(s): TSH, T4TOTAL, FREET4, T3FREE, THYROIDAB in the last 72 hours. Anemia Panel: No results for input(s): VITAMINB12, FOLATE, FERRITIN, TIBC, IRON, RETICCTPCT in the last 72 hours. Sepsis Labs: Recent Labs  Lab 09/09/20 1558 09/09/20 2032  LATICACIDVEN 1.0 0.7    Recent Results (from the past 240 hour(s))  Resp Panel by RT-PCR (Flu A&B, Covid) Nasopharyngeal Swab     Status: None   Collection Time: 09/07/20  4:55  AM   Specimen: Nasopharyngeal Swab; Nasopharyngeal(NP) swabs in vial transport medium  Result Value Ref Range Status   SARS Coronavirus 2 by RT PCR NEGATIVE NEGATIVE Final    Comment: (NOTE) SARS-CoV-2 target nucleic acids are NOT DETECTED.  The  SARS-CoV-2 RNA is generally detectable in upper respiratory specimens during the acute phase of infection. The lowest concentration of SARS-CoV-2 viral copies this assay can detect is 138 copies/mL. A negative result does not preclude SARS-Cov-2 infection and should not be used as the sole basis for treatment or other patient management decisions. A negative result may occur with  improper specimen collection/handling, submission of specimen other than nasopharyngeal swab, presence of viral mutation(s) within the areas targeted by this assay, and inadequate number of viral copies(<138 copies/mL). A negative result must be combined with clinical observations, patient history, and epidemiological information. The expected result is Negative.  Fact Sheet for Patients:  BloggerCourse.com  Fact Sheet for Healthcare Providers:  SeriousBroker.it  This test is no t yet approved or cleared by the [place] FDA and  has been authorized for detection and/or diagnosis of SARS-CoV-2 by FDA under an Emergency Use Authorization (EUA). This EUA will remain  in effect (meaning this test can be used) for the duration of the COVID-19 declaration under Section 564(b)(1) of the Act, 21 U.S.C.section 360bbb-3(b)(1), unless the authorization is terminated  or revoked sooner.       Influenza A by PCR NEGATIVE NEGATIVE Final   Influenza B by PCR NEGATIVE NEGATIVE Final    Comment: (NOTE) The Xpert Xpress SARS-CoV-2/FLU/RSV plus assay is intended as an aid in the diagnosis of influenza from Nasopharyngeal swab specimens and should not be used as a sole basis for treatment. Nasal washings and aspirates are unacceptable for Xpert Xpress SARS-CoV-2/FLU/RSV testing.  Fact Sheet for Patients: BloggerCourse.com  Fact Sheet for Healthcare Providers: SeriousBroker.it  This test is not yet approved or  cleared by the [place] FDA and has been authorized for detection and/or diagnosis of SARS-CoV-2 by FDA under an Emergency Use Authorization (EUA). This EUA will remain in effect (meaning this test can be used) for the duration of the COVID-19 declaration under Section 564(b)(1) of the Act, 21 U.S.C. section 360bbb-3(b)(1), unless the authorization is terminated or revoked.  Performed at [facility], [address] [place], [place] [address]   Urine Culture     Status: Abnormal   Collection Time: 09/09/20  2:37 PM   Specimen: Urine, Clean Catch  Result Value Ref Range Status   Specimen Description URINE, CLEAN CATCH  Final   Special Requests   Final    NONE Performed at [facility], [address], [place], [place] [address]    Culture MULTIPLE SPECIES PRESENT, SUGGEST RECOLLECTION (A)  Final   Report Status 09/10/2020 FINAL  Final  Resp Panel by RT-PCR (Flu A&B, Covid) Nasopharyngeal Swab     Status: None   Collection Time: 09/09/20  3:38 PM   Specimen: Nasopharyngeal Swab; Nasopharyngeal(NP) swabs in vial transport medium  Result Value Ref Range Status   SARS Coronavirus 2 by RT PCR NEGATIVE NEGATIVE Final    Comment: (NOTE) SARS-CoV-2 target nucleic acids are NOT DETECTED.  The SARS-CoV-2 RNA is generally detectable in upper respiratory specimens during the acute phase of infection. The lowest concentration of SARS-CoV-2 viral copies this assay can detect is 138 copies/mL. A negative result does not preclude SARS-Cov-2 infection and should not be used as the sole basis for treatment  or other patient management decisions. A negative result may occur with  improper specimen collection/handling, submission of specimen other than nasopharyngeal swab, presence of viral mutation(s) within the areas targeted by this assay, and inadequate number of viral copies(<138 copies/mL). A negative result must be combined with clinical observations, patient history, and  epidemiological information. The expected result is Negative.  Fact Sheet for Patients:  BloggerCourse.com  Fact Sheet for Healthcare Providers:  SeriousBroker.it  This test is no t yet approved or cleared by the [place] FDA and  has been authorized for detection and/or diagnosis of SARS-CoV-2 by FDA under an Emergency Use Authorization (EUA). This EUA will remain  in effect (meaning this test can be used) for the duration of the COVID-19 declaration under Section 564(b)(1) of the Act, 21 U.S.C.section 360bbb-3(b)(1), unless the authorization is terminated  or revoked sooner.       Influenza A by PCR NEGATIVE NEGATIVE Final   Influenza B by PCR NEGATIVE NEGATIVE Final    Comment: (NOTE) The Xpert Xpress SARS-CoV-2/FLU/RSV plus assay is intended as an aid in the diagnosis of influenza from Nasopharyngeal swab specimens and should not be used as a sole basis for treatment. Nasal washings and aspirates are unacceptable for Xpert Xpress SARS-CoV-2/FLU/RSV testing.  Fact Sheet for Patients: BloggerCourse.com  Fact Sheet for Healthcare Providers: SeriousBroker.it  This test is not yet approved or cleared by the [place] FDA and has been authorized for detection and/or diagnosis of SARS-CoV-2 by FDA under an Emergency Use Authorization (EUA). This EUA will remain in effect (meaning this test can be used) for the duration of the COVID-19 declaration under Section 564(b)(1) of the Act, 21 U.S.C. section 360bbb-3(b)(1), unless the authorization is terminated or revoked.  Performed at [facility], [address], [place], [place] [address]   Culture, blood (routine x 2)     Status: None (Preliminary result)   Collection Time: 09/09/20  8:33 PM   Specimen: BLOOD RIGHT HAND  Result Value Ref Range Status   Specimen Description BLOOD RIGHT HAND  Final   Special  Requests   Final    BOTTLES DRAWN AEROBIC AND ANAEROBIC Blood Culture results may not be optimal due to an inadequate volume of blood received in culture bottles   Culture   Final    NO GROWTH 2 DAYS Performed at [facility], [address], [place], [place] [address]    Report Status PENDING  Incomplete  Culture, blood (routine x 2)     Status: None (Preliminary result)   Collection Time: 09/10/20  1:20 AM   Specimen: BLOOD RIGHT ARM  Result Value Ref Range Status   Specimen Description BLOOD RIGHT ARM  Final   Special Requests   Final    BOTTLES DRAWN AEROBIC AND ANAEROBIC Blood Culture adequate volume   Culture   Final    NO GROWTH 1 DAY Performed at [facility], [address], [place], [place] [address]    Report Status PENDING  Incomplete  Surgical pcr screen     Status: None   Collection Time: 09/11/20 12:20 AM   Specimen: Nasal Mucosa; Nasal Swab  Result Value Ref Range Status   MRSA, PCR NEGATIVE NEGATIVE Final   Staphylococcus aureus NEGATIVE NEGATIVE Final    Comment: (NOTE) The Xpert SA Assay (FDA approved for NASAL specimens in patients 40 years of age and older), is one component of a comprehensive surveillance program. It is not intended to diagnose infection  nor to guide or monitor treatment. Performed at [facility], [address], [place], [place] [address]      Radiology Studies: NM Hepatobiliary Liver Func  Result Date: 09/10/2020 CLINICAL DATA:  Rule out cholecystitis. EXAM: NUCLEAR MEDICINE HEPATOBILIARY IMAGING TECHNIQUE: Sequential images of the abdomen were obtained out to 60 minutes following intravenous administration of radiopharmaceutical. RADIOPHARMACEUTICALS:  4.85 mCi Tc-73m  Choletec IV COMPARISON:  Abdominal sonogram 05/10/2020. FINDINGS: Prompt uptake and biliary excretion of activity by the liver is seen. Biliary activity passes into small bowel, consistent with patent common bile duct. After 60 minutes of imaging no  gallbladder activity was visualized. Patient then received 3 mg of morphine IV, and imaging was carried out for an additional 30 minutes. After 30 minutes no gallbladder activity was visualized. IMPRESSION: Nonvisualization of the gallbladder compatible with cystic duct obstruction due to acute cholecystitis. These results will be called to the ordering clinician or representative by the Radiologist Assistant, and communication documented in the PACS or [employer]. Electronically Signed   By: Signa Kell M.D.   On: 09/10/2020 13:51   ECHOCARDIOGRAM COMPLETE  Result Date: 09/11/2020    ECHOCARDIOGRAM REPORT   Patient Name:   Sarah Francis Date of Exam: 09/10/2020 Medical Rec #:  [id number]    Height:       62.0 in Accession #:    [id number]   Weight:       155.0 lb Date of Birth:  May 23, 1945    BSA:          1.715 m Patient Age:    75 years     BP:           134/64 mmHg Patient Gender: F            HR:           113 bpm. Exam Location:  Inpatient Procedure: 2D Echo, Color Doppler, Cardiac Doppler and Intracardiac            Opacification Agent                                MODIFIED REPORT:   This report was modified by Chilton Si MD on 09/11/2020 due to apical                                    akinesis.  Indications:     R07.9* Chest pain, unspecified  History:         Patient has prior history of Echocardiogram examinations, most                  recent 12/01/2018. CAD, PAD; Risk Factors:Hypertension, Diabetes                  and Dyslipidemia.  Sonographer:     Irving Burton Senior RDCS Referring Phys:  [id number] Alessandra Bevels Diagnosing Phys: Chilton Si MD IMPRESSIONS  1. Intracavitary gradient. Peak velocity 3.32 m/s. Peak gradient 44.2 mmHg. Apical akinesis. Left ventricular ejection fraction, by estimation, is 70 to 75%. The left ventricle has hyperdynamic function. The left ventricle demonstrates regional wall motion abnormalities (see scoring diagram/findings for description). There is moderate  concentric left ventricular hypertrophy. Left ventricular diastolic parameters are consistent with Grade I diastolic dysfunction (impaired relaxation). Elevated left ventricular end-diastolic pressure.  2. Right ventricular systolic function is normal. The right  ventricular size is normal. There is normal pulmonary artery systolic pressure.  3. The mitral valve is normal in structure. Trivial mitral valve regurgitation. No evidence of mitral stenosis.  4. The aortic valve is tricuspid. Aortic valve regurgitation is not visualized. No aortic stenosis is present.  5. The inferior vena cava is normal in size with greater than 50% respiratory variability, suggesting right atrial pressure of 3 mmHg. FINDINGS  Left Ventricle: Intracavitary gradient. Peak velocity 3.32 m/s. Peak gradient 44.2 mmHg. Apical akinesis. Left ventricular ejection fraction, by estimation, is 70 to 75%. The left ventricle has hyperdynamic function. The left ventricle demonstrates regional wall motion abnormalities. Definity contrast agent was given IV to delineate the left ventricular endocardial borders. The left ventricular internal cavity size was normal in size. There is moderate concentric left ventricular hypertrophy. Left ventricular diastolic parameters are consistent with Grade I diastolic dysfunction (impaired relaxation). Elevated left ventricular end-diastolic pressure. Right Ventricle: The right ventricular size is normal. No increase in right ventricular wall thickness. Right ventricular systolic function is normal. There is normal pulmonary artery systolic pressure. The tricuspid regurgitant velocity is 2.59 m/s, and  with an assumed right atrial pressure of 3 mmHg, the estimated right ventricular systolic pressure is 29.8 mmHg. Left Atrium: Left atrial size was normal in size. Right Atrium: Right atrial size was normal in size. Pericardium: There is no evidence of pericardial effusion. Mitral Valve: The mitral valve is normal in  structure. Trivial mitral valve regurgitation. No evidence of mitral valve stenosis. Tricuspid Valve: The tricuspid valve is normal in structure. Tricuspid valve regurgitation is trivial. No evidence of tricuspid stenosis. Aortic Valve: The aortic valve is tricuspid. Aortic valve regurgitation is not visualized. No aortic stenosis is present. Pulmonic Valve: The pulmonic valve was normal in structure. Pulmonic valve regurgitation is not visualized. No evidence of pulmonic stenosis. Aorta: The aortic root is normal in size and structure. Venous: The inferior vena cava is normal in size with greater than 50% respiratory variability, suggesting right atrial pressure of 3 mmHg. IAS/Shunts: No atrial level shunt detected by color flow Doppler.  LEFT VENTRICLE PLAX 2D LVIDd:         3.10 cm  Diastology LVIDs:         2.00 cm  LV e' medial:    4.57 cm/s LV PW:         1.40 cm  LV E/e' medial:  15.4 LV IVS:        1.40 cm  LV e' lateral:   9.25 cm/s LVOT diam:     1.90 cm  LV E/e' lateral: 7.6 LV SV:         90 LV SV Index:   52 LVOT Area:     2.84 cm  RIGHT VENTRICLE RV S prime:     12.80 cm/s TAPSE (M-mode): 2.0 cm LEFT ATRIUM             Index       RIGHT ATRIUM           Index LA diam:        3.60 cm 2.10 cm/m  RA Area:     11.80 cm LA Vol (A2C):   48.2 ml 28.10 ml/m RA Volume:   23.60 ml  13.76 ml/m LA Vol (A4C):   39.0 ml 22.73 ml/m LA Biplane Vol: 44.5 ml 25.94 ml/m  AORTIC VALVE LVOT Vmax:   156.00 cm/s LVOT Vmean:  120.000 cm/s LVOT VTI:    0.317 m  AORTA  Ao Root diam: 2.60 cm Ao Asc diam:  2.70 cm MITRAL VALVE                TRICUSPID VALVE MV Area (PHT): 2.99 cm     TR Peak grad:   26.8 mmHg MV Decel Time: 254 msec     TR Vmax:        258.95 cm/s MV E velocity: 70.60 cm/s MV A velocity: 107.00 cm/s  SHUNTS MV E/A ratio:  0.66         Systemic VTI:  0.32 m                             Systemic Diam: 1.90 cm Chilton Si MD Electronically signed by Chilton Si MD Signature Date/Time:  09/10/2020/4:39:43 PM    Final (Updated)      LOS: 1 day   Lanae Boast, MD [facility]  09/11/2020, 2:46 PM

## 2020-09-11 NOTE — Progress Notes (Signed)
    Discussed with pharmacy service regarding how long Brilinta would have to be held since she is on the low dosage of 60 mg.  There are no clear guidelines but checking a P2 Y 12 platelet inhibition test would be helpful to see when the Brilinta is fully out of her system.  We will order this test and hopefully, this will help expedite her surgery.  Corky Crafts, MD

## 2020-09-12 DIAGNOSIS — K8000 Calculus of gallbladder with acute cholecystitis without obstruction: Secondary | ICD-10-CM | POA: Diagnosis not present

## 2020-09-12 LAB — COMPREHENSIVE METABOLIC PANEL
ALT: 21 U/L (ref 0–44)
AST: 22 U/L (ref 15–41)
Albumin: 2.3 g/dL — ABNORMAL LOW (ref 3.5–5.0)
Alkaline Phosphatase: 102 U/L (ref 38–126)
Anion gap: 10 (ref 5–15)
BUN: 8 mg/dL (ref 8–23)
CO2: 24 mmol/L (ref 22–32)
Calcium: 8.3 mg/dL — ABNORMAL LOW (ref 8.9–10.3)
Chloride: 91 mmol/L — ABNORMAL LOW (ref 98–111)
Creatinine, Ser: 0.77 mg/dL (ref 0.44–1.00)
GFR, Estimated: >60 mL/min (ref >60)
Glucose, Bld: 208 mg/dL — ABNORMAL HIGH (ref 70–99)
Potassium: 3.2 mmol/L — ABNORMAL LOW (ref 3.5–5.1)
Sodium: 125 mmol/L — ABNORMAL LOW (ref 135–145)
Total Bilirubin: 0.6 mg/dL (ref 0.3–1.2)
Total Protein: 5.5 g/dL — ABNORMAL LOW (ref 6.5–8.1)

## 2020-09-12 LAB — CBC
HCT: 31.8 % — ABNORMAL LOW (ref 36.0–46.0)
Hemoglobin: 11.2 g/dL — ABNORMAL LOW (ref 12.0–15.0)
MCH: 32.7 pg (ref 26.0–34.0)
MCHC: 35.2 g/dL (ref 30.0–36.0)
MCV: 93 fL (ref 80.0–100.0)
Platelets: 239 10*3/uL (ref 150–400)
RBC: 3.42 MIL/uL — ABNORMAL LOW (ref 3.87–5.11)
RDW: 13.2 % (ref 11.5–15.5)
WBC: 21.3 10*3/uL — ABNORMAL HIGH (ref 4.0–10.5)
nRBC: 0 % (ref 0.0–0.2)

## 2020-09-12 LAB — GLUCOSE, CAPILLARY
Glucose-Capillary: 220 mg/dL — ABNORMAL HIGH (ref 70–99)
Glucose-Capillary: 173 mg/dL — ABNORMAL HIGH (ref 70–99)
Glucose-Capillary: 162 mg/dL — ABNORMAL HIGH (ref 70–99)
Glucose-Capillary: 147 mg/dL — ABNORMAL HIGH (ref 70–99)
Glucose-Capillary: 439 mg/dL — ABNORMAL HIGH (ref 70–99)
Glucose-Capillary: 320 mg/dL — ABNORMAL HIGH (ref 70–99)

## 2020-09-12 LAB — TSH: TSH: 1.502 u[IU]/mL (ref 0.350–4.500)

## 2020-09-12 MED ORDER — INSULIN GLARGINE-YFGN 100 UNIT/ML ~~LOC~~ SOLN
10.0000 [IU] | Freq: Every day | SUBCUTANEOUS | Status: DC
  Administered 2020-09-12 – 2020-09-14 (×3): 10 [IU] via SUBCUTANEOUS
  Filled 2020-09-12 (×3): qty 0.1

## 2020-09-12 MED ORDER — POTASSIUM CHLORIDE CRYS ER 20 MEQ PO TBCR
40.0000 meq | EXTENDED_RELEASE_TABLET | ORAL | Status: AC
Start: 2020-09-12 — End: 2020-09-12
  Administered 2020-09-12 (×2): 40 meq via ORAL
  Filled 2020-09-12 (×3): qty 2

## 2020-09-12 MED ORDER — INSULIN ASPART 100 UNIT/ML IJ SOLN
7.0000 [IU] | Freq: Once | INTRAMUSCULAR | Status: AC
  Administered 2020-09-12: 7 [IU] via SUBCUTANEOUS

## 2020-09-12 NOTE — Progress Notes (Signed)
PROGRESS NOTE    Sarah Francis  CHY:[id number] DOB: [birth year] DOA: 09/09/2020 PCP: Gweneth Dimitri, MD   Chief Complaint  Patient presents with   Chest Pain   Brief Narrative: 75 year old female with history of CAD DES x4 in May 2020 on aspirin and Brilinta, PAD, HTN, HLD, GERD, T2DM, anxiety/depression sent from PCP office due to abdominal pain, back pain nausea vomiting. She was initially seen in the ED 8/14 and had  CTA chest and CT abd/pelvis which revealed a 2.2cm gallstone in the gallbladder neck with wall thickening without pericholecystic inflammation, hepatic steatosis, small hiatal hernia, diverticulosis without inflammation, and was otherwise reassuring. She was allowed to go home only to have recurrence of symptoms. In the ED afebrile, with leukocytosis, hyponatremia troponin 21> 19, surgery consulted underwent HIDA scan that was positive with nonvisualization of gallbladder, cardio was consulted for preop risk stratification and admitted under hospitalist for further management  Subjective: Seen this morning.  She denies any abdominal pain.  Complains of back pain due to laying on the bed and took some pain medication this morning. Denies any focal numbness numbness tingling or dizziness. Afebrile overnight, BP running high sodium remains low at 125 and same.  Assessment & Plan:  Acute cholecystitis in the setting of cholelithiasis: Positive HIDA scan.  Surgery following, seen by cardiology preop , holding for Brilinta washout for surgery, platelet inhibition P2Y12 pending since patient is on only 60 mg Brilinta.  Continue on IV fluids,Cipro/Flagyl. Leukocytosis improving slowly and LFTs remain stable.  Hyponatremia/Hypochloremia: Has been in 124-125-Recently running 127 09/07/20. Sodium was last normal at 134 :11/2018. 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 Check Bmp in am. If worsening nephro  eval.  Recent Labs  Lab 09/09/20 1210 09/09/20 2032 09/10/20 0126 09/11/20 0351 09/12/20 0218  NA 124* 125* 124* 125* 125*   Hypokalemia 3.2 will replete. CAD with a stent PCI/DES May 2020, troponin negative in the ED, EKG PA-C T wave changes seen by cardiology Brilinta on hold, continue aspirin, beta-blocker.   B/L Carotid artery disease: US carotid 10/2019- by Dr Lawernce Keas showed bilateral 60 to 79% carotid stenosis, no intervention needed , advised follow-up in 12 months unless new symptoms of dizziness/weakness occurs per Dr Delton See . Dr Eldridge Dace following and discussed- needs Op f/u in Octo 2022 w/ repeat US.  PVD: On aspirin/statin HLD-on statin HTN: BP running on higher side, continue lisinopril, metoprolol, Lipitor aspirin  T2DM: On Lantus 30 units twice daily and Humalog 12 units morning and bedtime at home.  Sugar poorly controlled, added Lantus 10 units daily , cont ssi Recent Labs  Lab 09/11/20 0800 09/11/20 1253 09/11/20 1640 09/11/20 2055 09/12/20 0752  GLUCAP 192* 212* 126* 279* 220*    Overweight with BMI 28:Weight loss will be beneficial  Diet Order             Diet full liquid Room service appropriate? Yes; Fluid consistency: Thin  Diet effective now                 Patient's Body mass index is 28.35 kg/m. DVT prophylaxis: enoxaparin (LOVENOX) injection 40 mg Start: 09/09/20 2200 Code Status:   Code Status: Full Code  Family Communication: plan of care discussed with patient at bedside. Status is: Inpatient Remains inpatient appropriate because:IV treatments appropriate due to intensity of illness  or inability to take PO and Inpatient level of care appropriate due to severity of illness Dispo: The patient is from: Home              Anticipated d/c is to: Home              Patient currently is not medically stable to d/c.   Difficult to place patient No Unresulted Labs (From  admission, onward)     Start     Ordered   09/16/20 0500  Creatinine, serum  (enoxaparin (LOVENOX)    CrCl >/= 30 ml/min)  Weekly,   R     Comments: while on enoxaparin therapy    09/09/20 1802   09/11/20 1040  Platelet inhibition p2y12 (Not at [facility])  ONCE - STAT,   STAT        09/11/20 1039           Medications reviewed:  Scheduled Meds:  aspirin EC  81 mg Oral Daily   atorvastatin  80 mg Oral QHS   cholecalciferol  2,000 Units Oral Daily   cycloSPORINE  1 drop Both Eyes BID   enoxaparin (LOVENOX) injection  40 mg Subcutaneous Q24H   insulin aspart  0-15 Units Subcutaneous TID WC   insulin aspart  0-5 Units Subcutaneous QHS   lisinopril  5 mg Oral Daily   loratadine  10 mg Oral Daily   metoprolol tartrate  50 mg Oral BID   pantoprazole (PROTONIX) IV  40 mg Intravenous Q12H   thiamine  100 mg Oral Daily   Continuous Infusions:  ciprofloxacin 400 mg (09/12/20 0416)   And   metronidazole 500 mg (09/12/20 0001)   lactated ringers Stopped (09/11/20 1552)   Consultants:see note  Procedures:see note Antimicrobials: Anti-infectives (From admission, onward)    Start     Dose/Rate Route Frequency Ordered Stop   09/10/20 0400  ciprofloxacin (CIPRO) IVPB 400 mg       See Hyperspace for full Linked Orders Report.   400 mg 200 mL/hr over 60 Minutes Intravenous Every 12 hours 09/09/20 1844     09/10/20 0015  metroNIDAZOLE (FLAGYL) IVPB 500 mg       See Hyperspace for full Linked Orders Report.   500 mg 100 mL/hr over 60 Minutes Intravenous Every 8 hours 09/09/20 1844     09/09/20 1530  ciprofloxacin (CIPRO) IVPB 400 mg       See Hyperspace for full Linked Orders Report.   400 mg 200 mL/hr over 60 Minutes Intravenous  Once 09/09/20 1515 09/09/20 1814   09/09/20 1530  metroNIDAZOLE (FLAGYL) IVPB 500 mg       See Hyperspace for full Linked Orders Report.   500 mg 100 mL/hr over 60 Minutes Intravenous  Once 09/09/20 1515 09/09/20 1814      Culture/Microbiology     Component Value Date/Time   SDES BLOOD RIGHT ARM 09/10/2020 0120   SPECREQUEST  09/10/2020 0120    BOTTLES DRAWN AEROBIC AND ANAEROBIC Blood Culture adequate volume   CULT  09/10/2020 0120    NO GROWTH 1 DAY Performed at [facility] Lab, [address] [place], [place] [address]    REPTSTATUS PENDING 09/10/2020 0120    Other culture-see note  Objective: Vitals: Today's Vitals   09/11/20 2244 09/12/20 0147 09/12/20 0354 09/12/20 0755  BP:  (!) 123/45 (!) 151/64 (!) 166/62  Pulse:  73 80 85  Resp:   17 20  Temp:  98.1 F (36.7 C) 97.6 F (  36.4 C) (!) 97.5 F (36.4 C)  TempSrc:  Oral Oral Oral  SpO2:  97% 98% 94%  Weight:      Height:      PainSc: Asleep  0-No pain     Intake/Output Summary (Last 24 hours) at 09/12/2020 0756 Last data filed at 09/11/2020 1601 Gross per 24 hour  Intake 944.8 ml  Output --  Net 944.8 ml    Filed Weights   09/09/20 1148  Weight: 70.3 kg   Weight change:   Intake/Output from previous day: 08/18 0701 - 08/19 0700 In: 944.8 [I.V.:660.5; IV Piggyback:284.3] Out: -  Intake/Output this shift: No intake/output data recorded. Filed Weights   09/09/20 1148  Weight: 70.3 kg   Examination: General exam: AAOx 3, pleasant, not in distress.  On room air.  HEENT:Oral mucosa moist, Ear/Nose WNL grossly, dentition normal. Respiratory system: bilaterally diminished, no use of accessory muscle Cardiovascular system: S1 & S2 +, No JVD,. Gastrointestinal system: Abdomen soft, NT,ND, BS+ Nervous System:Alert, awake, moving extremities and grossly nonfocal Extremities: No edema, distal peripheral pulses palpable.  Skin: No rashes,no icterus. MSK: Normal muscle bulk,tone, power   Data Reviewed: I have personally reviewed following labs and imaging studies CBC: Recent Labs  Lab 09/07/20 0447 09/07/20 0504 09/09/20 1210 09/09/20 2032 09/10/20 0126 09/11/20 0351 09/12/20 0218  WBC 14.1*  --  27.1* 26.4* 27.8* 24.2* 21.3*  NEUTROABS  11.6*  --  25.2*  --   --   --   --   HGB 13.9   < > 14.1 13.1 12.8 12.7 11.2*  HCT 40.7   < > 40.2 37.3 37.0 35.3* 31.8*  MCV 97.6  --  94.1 94.7 94.4 92.2 93.0  PLT 270  --  248 220 224 230 239   < > = values in this interval not displayed.    Basic Metabolic Panel: Recent Labs  Lab 09/09/20 1210 09/09/20 2032 09/10/20 0126 09/11/20 0351 09/12/20 0218  NA 124* 125* 124* 125* 125*  K 3.5 3.5 3.6 3.7 3.2*  CL 89* 92* 91* 88* 91*  CO2 22 21* 20* 24 24  GLUCOSE 178* 157* 192* 128* 208*  BUN 7* 8  CREATININE 0.66 0.60 0.72 0.71 0.77  CALCIUM 8.9 8.2* 8.2* 8.5* 8.3*    GFR: Estimated Creatinine Clearance: 57.5 mL/min (by C-G formula based on SCr of 0.77 mg/dL). Liver Function Tests: Recent Labs  Lab 09/07/20 0447 09/09/20 1210 09/10/20 0126 09/11/20 0351 09/12/20 0218  AST 32 ALT 43 ALKPHOS 64 75 93 85 102  BILITOT 1.0 1.4* 1.2 0.9 0.6  PROT 7.0 7.2 6.3* 5.9* 5.5*  ALBUMIN 4.0 3.4* 3.0* 2.6* 2.3*    Recent Labs  Lab 09/07/20 0447 09/09/20 1210  LIPASE 29 27    No results for input(s): AMMONIA in the last 168 hours. Coagulation Profile: Recent Labs  Lab 09/07/20 0447  INR 0.9    Cardiac Enzymes: No results for input(s): CKTOTAL, CKMB, CKMBINDEX, TROPONINI in the last 168 hours. BNP (last 3 results) No results for input(s): PROBNP in the last 8760 hours. HbA1C: Recent Labs    09/09/20 2032  HGBA1C 8.7*    CBG: Recent Labs  Lab 09/11/20 0800 09/11/20 1253 09/11/20 1640 09/11/20 2055 09/12/20 0752  GLUCAP 192* 212* 126* 279* 220*    Lipid Profile: No results for input(s): CHOL, HDL, LDLCALC, TRIG, CHOLHDL, LDLDIRECT in the last 72 hours. Thyroid Function Tests: Recent Labs  09/12/20 0218  TSH 1.502   Anemia Panel: No results for input(s): VITAMINB12, FOLATE, FERRITIN, TIBC, IRON, RETICCTPCT in the last 72 hours. Sepsis Labs: Recent Labs  Lab 09/09/20 1558 09/09/20 2032  LATICACIDVEN 1.0 0.7      Recent Results (from the past 240 hour(s))  Resp Panel by RT-PCR (Flu A&B, Covid) Nasopharyngeal Swab     Status: None   Collection Time: 09/07/20  4:55 AM   Specimen: Nasopharyngeal Swab; Nasopharyngeal(NP) swabs in vial transport medium  Result Value Ref Range Status   SARS Coronavirus 2 by RT PCR NEGATIVE NEGATIVE Final    Comment: (NOTE) SARS-CoV-2 target nucleic acids are NOT DETECTED.  The SARS-CoV-2 RNA is generally detectable in upper respiratory specimens during the acute phase of infection. The lowest concentration of SARS-CoV-2 viral copies this assay can detect is 138 copies/mL. A negative result does not preclude SARS-Cov-2 infection and should not be used as the sole basis for treatment or other patient management decisions. A negative result may occur with  improper specimen collection/handling, submission of specimen other than nasopharyngeal swab, presence of viral mutation(s) within the areas targeted by this assay, and inadequate number of viral copies(<138 copies/mL). A negative result must be combined with clinical observations, patient history, and epidemiological information. The expected result is Negative.  Fact Sheet for Patients:  BloggerCourse.com  Fact Sheet for Healthcare Providers:  SeriousBroker.it  This test is no t yet approved or cleared by the [place] FDA and  has been authorized for detection and/or diagnosis of SARS-CoV-2 by FDA under an Emergency Use Authorization (EUA). This EUA will remain  in effect (meaning this test can be used) for the duration of the COVID-19 declaration under Section 564(b)(1) of the Act, 21 U.S.C.section 360bbb-3(b)(1), unless the authorization is terminated  or revoked sooner.       Influenza A by PCR NEGATIVE NEGATIVE Final   Influenza B by PCR NEGATIVE NEGATIVE Final    Comment: (NOTE) The Xpert Xpress SARS-CoV-2/FLU/RSV plus assay is intended as  an aid in the diagnosis of influenza from Nasopharyngeal swab specimens and should not be used as a sole basis for treatment. Nasal washings and aspirates are unacceptable for Xpert Xpress SARS-CoV-2/FLU/RSV testing.  Fact Sheet for Patients: BloggerCourse.com  Fact Sheet for Healthcare Providers: SeriousBroker.it  This test is not yet approved or cleared by the [place] FDA and has been authorized for detection and/or diagnosis of SARS-CoV-2 by FDA under an Emergency Use Authorization (EUA). This EUA will remain in effect (meaning this test can be used) for the duration of the COVID-19 declaration under Section 564(b)(1) of the Act, 21 U.S.C. section 360bbb-3(b)(1), unless the authorization is terminated or revoked.  Performed at [facility] Lab, [address], [place], [place] [address]   Urine Culture     Status: Abnormal   Collection Time: 09/09/20  2:37 PM   Specimen: Urine, Clean Catch  Result Value Ref Range Status   Specimen Description URINE, CLEAN CATCH  Final   Special Requests   Final    NONE Performed at [facility] Lab, [address] [place], [place] [address]    Culture MULTIPLE SPECIES PRESENT, SUGGEST RECOLLECTION (A)  Final   Report Status 09/10/2020 FINAL  Final  Resp Panel by RT-PCR (Flu A&B, Covid) Nasopharyngeal Swab     Status: None   Collection Time: 09/09/20  3:38 PM   Specimen: Nasopharyngeal Swab; Nasopharyngeal(NP) swabs in vial transport medium  Result Value Ref Range Status  SARS Coronavirus 2 by RT PCR NEGATIVE NEGATIVE Final    Comment: (NOTE) SARS-CoV-2 target nucleic acids are NOT DETECTED.  The SARS-CoV-2 RNA is generally detectable in upper respiratory specimens during the acute phase of infection. The lowest concentration of SARS-CoV-2 viral copies this assay can detect is 138 copies/mL. A negative result does not preclude SARS-Cov-2 infection and should not be used as  the sole basis for treatment or other patient management decisions. A negative result may occur with  improper specimen collection/handling, submission of specimen other than nasopharyngeal swab, presence of viral mutation(s) within the areas targeted by this assay, and inadequate number of viral copies(<138 copies/mL). A negative result must be combined with clinical observations, patient history, and epidemiological information. The expected result is Negative.  Fact Sheet for Patients:  BloggerCourse.com  Fact Sheet for Healthcare Providers:  SeriousBroker.it  This test is no t yet approved or cleared by the [place] FDA and  has been authorized for detection and/or diagnosis of SARS-CoV-2 by FDA under an Emergency Use Authorization (EUA). This EUA will remain  in effect (meaning this test can be used) for the duration of the COVID-19 declaration under Section 564(b)(1) of the Act, 21 U.S.C.section 360bbb-3(b)(1), unless the authorization is terminated  or revoked sooner.       Influenza A by PCR NEGATIVE NEGATIVE Final   Influenza B by PCR NEGATIVE NEGATIVE Final    Comment: (NOTE) The Xpert Xpress SARS-CoV-2/FLU/RSV plus assay is intended as an aid in the diagnosis of influenza from Nasopharyngeal swab specimens and should not be used as a sole basis for treatment. Nasal washings and aspirates are unacceptable for Xpert Xpress SARS-CoV-2/FLU/RSV testing.  Fact Sheet for Patients: BloggerCourse.com  Fact Sheet for Healthcare Providers: SeriousBroker.it  This test is not yet approved or cleared by the [place] FDA and has been authorized for detection and/or diagnosis of SARS-CoV-2 by FDA under an Emergency Use Authorization (EUA). This EUA will remain in effect (meaning this test can be used) for the duration of the COVID-19 declaration under Section 564(b)(1) of  the Act, 21 U.S.C. section 360bbb-3(b)(1), unless the authorization is terminated or revoked.  Performed at [facility], [address], [place], [place] [address]   Culture, blood (routine x 2)     Status: None (Preliminary result)   Collection Time: 09/09/20  8:33 PM   Specimen: BLOOD RIGHT HAND  Result Value Ref Range Status   Specimen Description BLOOD RIGHT HAND  Final   Special Requests   Final    BOTTLES DRAWN AEROBIC AND ANAEROBIC Blood Culture results may not be optimal due to an inadequate volume of blood received in culture bottles   Culture   Final    NO GROWTH 2 DAYS Performed at [facility], [address], [place], [place] [address]    Report Status PENDING  Incomplete  Culture, blood (routine x 2)     Status: None (Preliminary result)   Collection Time: 09/10/20  1:20 AM   Specimen: BLOOD RIGHT ARM  Result Value Ref Range Status   Specimen Description BLOOD RIGHT ARM  Final   Special Requests   Final    BOTTLES DRAWN AEROBIC AND ANAEROBIC Blood Culture adequate volume   Culture   Final    NO GROWTH 1 DAY Performed at [facility], [address], [place], [place] [address]    Report Status PENDING  Incomplete  Surgical pcr screen     Status: None  Collection Time: 09/11/20 12:20 AM   Specimen: Nasal Mucosa; Nasal Swab  Result Value Ref Range Status   MRSA, PCR NEGATIVE NEGATIVE Final   Staphylococcus aureus NEGATIVE NEGATIVE Final    Comment: (NOTE) The Xpert SA Assay (FDA approved for NASAL specimens in patients 73 years of age and older), is one component of a comprehensive surveillance program. It is not intended to diagnose infection nor to guide or monitor treatment. Performed at [facility], [address] [place], [place] [address]       Radiology Studies: NM Hepatobiliary Liver Func  Result Date: 09/10/2020 CLINICAL DATA:  Rule out cholecystitis. EXAM: NUCLEAR MEDICINE HEPATOBILIARY IMAGING TECHNIQUE: Sequential  images of the abdomen were obtained out to 60 minutes following intravenous administration of radiopharmaceutical. RADIOPHARMACEUTICALS:  4.85 mCi Tc-7235m  Choletec IV COMPARISON:  Abdominal sonogram 05/10/2020. FINDINGS: Prompt uptake and biliary excretion of activity by the liver is seen. Biliary activity passes into small bowel, consistent with patent common bile duct. After 60 minutes of imaging no gallbladder activity was visualized. Patient then received 3 mg of morphine IV, and imaging was carried out for an additional 30 minutes. After 30 minutes no gallbladder activity was visualized. IMPRESSION: Nonvisualization of the gallbladder compatible with cystic duct obstruction due to acute cholecystitis. These results will be called to the ordering clinician or representative by the Radiologist Assistant, and communication documented in the PACS or [employer] Dashboard. Electronically Signed   By: Signa Kellaylor  Stroud M.D.   On: 09/10/2020 13:51   ECHOCARDIOGRAM COMPLETE  Result Date: 09/11/2020    ECHOCARDIOGRAM REPORT   Patient Name:   Paul HalfJOAN Furio Date of Exam: 09/10/2020 Medical Rec #:  [id number]    Height:       62.0 in Accession #:    [id number]   Weight:       155.0 lb Date of Birth:  1945-04-11    BSA:          1.715 m Patient Age:    75 years     BP:           134/64 mmHg Patient Gender: F            HR:           113 bpm. Exam Location:  Inpatient Procedure: 2D Echo, Color Doppler, Cardiac Doppler and Intracardiac            Opacification Agent                                MODIFIED REPORT:   This report was modified by Chilton Siiffany [place] MD on 09/11/2020 due to apical                                    akinesis.  Indications:     R07.9* Chest pain, unspecified  History:         Patient has prior history of Echocardiogram examinations, most                  recent 12/01/2018. CAD, PAD; Risk Factors:Hypertension, Diabetes                  and Dyslipidemia.  Sonographer:     Irving BurtonEmily Senior RDCS Referring Phys:   [id number] Alessandra BevelsNEELIMA KAMINENI Diagnosing Phys: Chilton Siiffany [place] MD IMPRESSIONS  1. Intracavitary gradient. Peak velocity 3.32 m/s.  Peak gradient 44.2 mmHg. Apical akinesis. Left ventricular ejection fraction, by estimation, is 70 to 75%. The left ventricle has hyperdynamic function. The left ventricle demonstrates regional wall motion abnormalities (see scoring diagram/findings for description). There is moderate concentric left ventricular hypertrophy. Left ventricular diastolic parameters are consistent with Grade I diastolic dysfunction (impaired relaxation). Elevated left ventricular end-diastolic pressure.  2. Right ventricular systolic function is normal. The right ventricular size is normal. There is normal pulmonary artery systolic pressure.  3. The mitral valve is normal in structure. Trivial mitral valve regurgitation. No evidence of mitral stenosis.  4. The aortic valve is tricuspid. Aortic valve regurgitation is not visualized. No aortic stenosis is present.  5. The inferior vena cava is normal in size with greater than 50% respiratory variability, suggesting right atrial pressure of 3 mmHg. FINDINGS  Left Ventricle: Intracavitary gradient. Peak velocity 3.32 m/s. Peak gradient 44.2 mmHg. Apical akinesis. Left ventricular ejection fraction, by estimation, is 70 to 75%. The left ventricle has hyperdynamic function. The left ventricle demonstrates regional wall motion abnormalities. Definity contrast agent was given IV to delineate the left ventricular endocardial borders. The left ventricular internal cavity size was normal in size. There is moderate concentric left ventricular hypertrophy. Left ventricular diastolic parameters are consistent with Grade I diastolic dysfunction (impaired relaxation). Elevated left ventricular end-diastolic pressure. Right Ventricle: The right ventricular size is normal. No increase in right ventricular wall thickness. Right ventricular systolic function is normal. There is  normal pulmonary artery systolic pressure. The tricuspid regurgitant velocity is 2.59 m/s, and  with an assumed right atrial pressure of 3 mmHg, the estimated right ventricular systolic pressure is 29.8 mmHg. Left Atrium: Left atrial size was normal in size. Right Atrium: Right atrial size was normal in size. Pericardium: There is no evidence of pericardial effusion. Mitral Valve: The mitral valve is normal in structure. Trivial mitral valve regurgitation. No evidence of mitral valve stenosis. Tricuspid Valve: The tricuspid valve is normal in structure. Tricuspid valve regurgitation is trivial. No evidence of tricuspid stenosis. Aortic Valve: The aortic valve is tricuspid. Aortic valve regurgitation is not visualized. No aortic stenosis is present. Pulmonic Valve: The pulmonic valve was normal in structure. Pulmonic valve regurgitation is not visualized. No evidence of pulmonic stenosis. Aorta: The aortic root is normal in size and structure. Venous: The inferior vena cava is normal in size with greater than 50% respiratory variability, suggesting right atrial pressure of 3 mmHg. IAS/Shunts: No atrial level shunt detected by color flow Doppler.  LEFT VENTRICLE PLAX 2D LVIDd:         3.10 cm  Diastology LVIDs:         2.00 cm  LV e' medial:    4.57 cm/s LV PW:         1.40 cm  LV E/e' medial:  15.4 LV IVS:        1.40 cm  LV e' lateral:   9.25 cm/s LVOT diam:     1.90 cm  LV E/e' lateral: 7.6 LV SV:         90 LV SV Index:   52 LVOT Area:     2.84 cm  RIGHT VENTRICLE RV S prime:     12.80 cm/s TAPSE (M-mode): 2.0 cm LEFT ATRIUM             Index       RIGHT ATRIUM           Index LA diam:  3.60 cm 2.10 cm/m  RA Area:     11.80 cm LA Vol (A2C):   48.2 ml 28.10 ml/m RA Volume:   23.60 ml  13.76 ml/m LA Vol (A4C):   39.0 ml 22.73 ml/m LA Biplane Vol: 44.5 ml 25.94 ml/m  AORTIC VALVE LVOT Vmax:   156.00 cm/s LVOT Vmean:  120.000 cm/s LVOT VTI:    0.317 m  AORTA Ao Root diam: 2.60 cm Ao Asc diam:  2.70 cm  MITRAL VALVE                TRICUSPID VALVE MV Area (PHT): 2.99 cm     TR Peak grad:   26.8 mmHg MV Decel Time: 254 msec     TR Vmax:        258.95 cm/s MV E velocity: 70.60 cm/s MV A velocity: 107.00 cm/s  SHUNTS MV E/A ratio:  0.66         Systemic VTI:  0.32 m                             Systemic Diam: 1.90 cm Chilton Si MD Electronically signed by Chilton Si MD Signature Date/Time: 09/10/2020/4:39:43 PM    Final (Updated)      LOS: 2 days   Lanae Boast, MD [facility]  09/12/2020, 7:56 AM

## 2020-09-12 NOTE — Care Management Important Message (Signed)
Important Message  Patient Details  Name: Sarah Francis MRN: [id number] Date of Birth: 1945-04-09   Medicare Important Message Given:  Yes     [name] 09/12/2020, 3:27 PM

## 2020-09-12 NOTE — Progress Notes (Signed)
Subjective: CC: No abdominal pain, n/v. Ready to eat  Objective: Vital signs in last 24 hours: Temp:  [97.5 F (36.4 C)-98.2 F (36.8 C)] 97.5 F (36.4 C) (08/19 0755) Pulse Rate:  [73-95] 85 (08/19 0755) Resp:  [16-20] 20 (08/19 0755) BP: (123-166)/(45-64) 166/62 (08/19 0755) SpO2:  [94 %-98 %] 94 % (08/19 0755) Last BM Date: 09/07/20  Intake/Output from previous day: 08/18 0701 - 08/19 0700 In: 944.8 [I.V.:660.5; IV Piggyback:284.3] Out: -  Intake/Output this shift: No intake/output data recorded.  PE: Gen:  Alert, NAD, pleasant, walking around her room Abd: Soft, ND, NT +BS  Lab Results:  Recent Labs    09/11/20 0351 09/12/20 0218  WBC 24.2* 21.3*  HGB 12.7 11.2*  HCT 35.3* 31.8*  PLT 230 239   BMET Recent Labs    09/11/20 0351 09/12/20 0218  NA 125* 125*  K 3.7 3.2*  CL 88* 91*  CO2 24 24  GLUCOSE 128* 208*  BUN 7* 8  CREATININE 0.71 0.77  CALCIUM 8.5* 8.3*   PT/INR No results for input(s): LABPROT, INR in the last 72 hours. CMP     Component Value Date/Time   NA 125 (L) 09/12/2020 0218   NA 136 07/20/2018 1348   K 3.2 (L) 09/12/2020 0218   CL 91 (L) 09/12/2020 0218   CO2 24 09/12/2020 0218   GLUCOSE 208 (H) 09/12/2020 0218   BUN 8 09/12/2020 0218   BUN 9 07/20/2018 1348   CREATININE 0.77 09/12/2020 0218   CALCIUM 8.3 (L) 09/12/2020 0218   PROT 5.5 (L) 09/12/2020 0218   ALBUMIN 2.3 (L) 09/12/2020 0218   AST 22 09/12/2020 0218   ALT 21 09/12/2020 0218   ALKPHOS 102 09/12/2020 0218   BILITOT 0.6 09/12/2020 0218   GFRNONAA >60 09/12/2020 0218   GFRAA >60 12/07/2018 1557   Lipase     Component Value Date/Time   LIPASE 27 09/09/2020 1210    Studies/Results: NM Hepatobiliary Liver Func  Result Date: 09/10/2020 CLINICAL DATA:  Rule out cholecystitis. EXAM: NUCLEAR MEDICINE HEPATOBILIARY IMAGING TECHNIQUE: Sequential images of the abdomen were obtained out to 60 minutes following intravenous administration of  radiopharmaceutical. RADIOPHARMACEUTICALS:  4.85 mCi Tc-55m  Choletec IV COMPARISON:  Abdominal sonogram 05/10/2020. FINDINGS: Prompt uptake and biliary excretion of activity by the liver is seen. Biliary activity passes into small bowel, consistent with patent common bile duct. After 60 minutes of imaging no gallbladder activity was visualized. Patient then received 3 mg of morphine IV, and imaging was carried out for an additional 30 minutes. After 30 minutes no gallbladder activity was visualized. IMPRESSION: Nonvisualization of the gallbladder compatible with cystic duct obstruction due to acute cholecystitis. These results will be called to the ordering clinician or representative by the Radiologist Assistant, and communication documented in the PACS or [employer]. Electronically Signed   By: Signa Kell M.D.   On: 09/10/2020 13:51   ECHOCARDIOGRAM COMPLETE  Result Date: 09/11/2020    ECHOCARDIOGRAM REPORT   Patient Name:   Sarah Francis Date of Exam: 09/10/2020 Medical Rec #:  [id number]    Height:       62.0 in Accession #:    [id number]   Weight:       155.0 lb Date of Birth:  11-Oct-1945    BSA:          1.715 m Patient Age:    75 years     BP:  134/64 mmHg Patient Gender: F            HR:           113 bpm. Exam Location:  Inpatient Procedure: 2D Echo, Color Doppler, Cardiac Doppler and Intracardiac            Opacification Agent                                MODIFIED REPORT:   This report was modified by Chilton Si MD on 09/11/2020 due to apical                                    akinesis.  Indications:     R07.9* Chest pain, unspecified  History:         Patient has prior history of Echocardiogram examinations, most                  recent 12/01/2018. CAD, PAD; Risk Factors:Hypertension, Diabetes                  and Dyslipidemia.  Sonographer:     Irving Burton Senior RDCS Referring Phys:  [id number] Alessandra Bevels Diagnosing Phys: Chilton Si MD IMPRESSIONS  1. Intracavitary  gradient. Peak velocity 3.32 m/s. Peak gradient 44.2 mmHg. Apical akinesis. Left ventricular ejection fraction, by estimation, is 70 to 75%. The left ventricle has hyperdynamic function. The left ventricle demonstrates regional wall motion abnormalities (see scoring diagram/findings for description). There is moderate concentric left ventricular hypertrophy. Left ventricular diastolic parameters are consistent with Grade I diastolic dysfunction (impaired relaxation). Elevated left ventricular end-diastolic pressure.  2. Right ventricular systolic function is normal. The right ventricular size is normal. There is normal pulmonary artery systolic pressure.  3. The mitral valve is normal in structure. Trivial mitral valve regurgitation. No evidence of mitral stenosis.  4. The aortic valve is tricuspid. Aortic valve regurgitation is not visualized. No aortic stenosis is present.  5. The inferior vena cava is normal in size with greater than 50% respiratory variability, suggesting right atrial pressure of 3 mmHg. FINDINGS  Left Ventricle: Intracavitary gradient. Peak velocity 3.32 m/s. Peak gradient 44.2 mmHg. Apical akinesis. Left ventricular ejection fraction, by estimation, is 70 to 75%. The left ventricle has hyperdynamic function. The left ventricle demonstrates regional wall motion abnormalities. Definity contrast agent was given IV to delineate the left ventricular endocardial borders. The left ventricular internal cavity size was normal in size. There is moderate concentric left ventricular hypertrophy. Left ventricular diastolic parameters are consistent with Grade I diastolic dysfunction (impaired relaxation). Elevated left ventricular end-diastolic pressure. Right Ventricle: The right ventricular size is normal. No increase in right ventricular wall thickness. Right ventricular systolic function is normal. There is normal pulmonary artery systolic pressure. The tricuspid regurgitant velocity is 2.59 m/s, and   with an assumed right atrial pressure of 3 mmHg, the estimated right ventricular systolic pressure is 29.8 mmHg. Left Atrium: Left atrial size was normal in size. Right Atrium: Right atrial size was normal in size. Pericardium: There is no evidence of pericardial effusion. Mitral Valve: The mitral valve is normal in structure. Trivial mitral valve regurgitation. No evidence of mitral valve stenosis. Tricuspid Valve: The tricuspid valve is normal in structure. Tricuspid valve regurgitation is trivial. No evidence of tricuspid stenosis. Aortic Valve: The aortic valve is tricuspid. Aortic valve regurgitation is not  visualized. No aortic stenosis is present. Pulmonic Valve: The pulmonic valve was normal in structure. Pulmonic valve regurgitation is not visualized. No evidence of pulmonic stenosis. Aorta: The aortic root is normal in size and structure. Venous: The inferior vena cava is normal in size with greater than 50% respiratory variability, suggesting right atrial pressure of 3 mmHg. IAS/Shunts: No atrial level shunt detected by color flow Doppler.  LEFT VENTRICLE PLAX 2D LVIDd:         3.10 cm  Diastology LVIDs:         2.00 cm  LV e' medial:    4.57 cm/s LV PW:         1.40 cm  LV E/e' medial:  15.4 LV IVS:        1.40 cm  LV e' lateral:   9.25 cm/s LVOT diam:     1.90 cm  LV E/e' lateral: 7.6 LV SV:         90 LV SV Index:   52 LVOT Area:     2.84 cm  RIGHT VENTRICLE RV S prime:     12.80 cm/s TAPSE (M-mode): 2.0 cm LEFT ATRIUM             Index       RIGHT ATRIUM           Index LA diam:        3.60 cm 2.10 cm/m  RA Area:     11.80 cm LA Vol (A2C):   48.2 ml 28.10 ml/m RA Volume:   23.60 ml  13.76 ml/m LA Vol (A4C):   39.0 ml 22.73 ml/m LA Biplane Vol: 44.5 ml 25.94 ml/m  AORTIC VALVE LVOT Vmax:   156.00 cm/s LVOT Vmean:  120.000 cm/s LVOT VTI:    0.317 m  AORTA Ao Root diam: 2.60 cm Ao Asc diam:  2.70 cm MITRAL VALVE                TRICUSPID VALVE MV Area (PHT): 2.99 cm     TR Peak grad:   26.8 mmHg  MV Decel Time: 254 msec     TR Vmax:        258.95 cm/s MV E velocity: 70.60 cm/s MV A velocity: 107.00 cm/s  SHUNTS MV E/A ratio:  0.66         Systemic VTI:  0.32 m                             Systemic Diam: 1.90 cm Chilton Si MD Electronically signed by Chilton Si MD Signature Date/Time: 09/10/2020/4:39:43 PM    Final (Updated)     Anti-infectives: Anti-infectives (From admission, onward)    Start     Dose/Rate Route Frequency Ordered Stop   09/10/20 0400  ciprofloxacin (CIPRO) IVPB 400 mg       See Hyperspace for full Linked Orders Report.   400 mg 200 mL/hr over 60 Minutes Intravenous Every 12 hours 09/09/20 1844     09/10/20 0015  metroNIDAZOLE (FLAGYL) IVPB 500 mg       See Hyperspace for full Linked Orders Report.   500 mg 100 mL/hr over 60 Minutes Intravenous Every 8 hours 09/09/20 1844     09/09/20 1530  ciprofloxacin (CIPRO) IVPB 400 mg       See Hyperspace for full Linked Orders Report.   400 mg 200 mL/hr over 60 Minutes Intravenous  Once 09/09/20 1515 09/09/20 1814  09/09/20 1530  metroNIDAZOLE (FLAGYL) IVPB 500 mg       See Hyperspace for full Linked Orders Report.   500 mg 100 mL/hr over 60 Minutes Intravenous  Once 09/09/20 1515 09/09/20 1814        Assessment/Plan Acute Cholecystitis  LUQ abdominal pain - HIDA positive - WBC 21K today, trending down - Cont abx - Appreciate cards recs. They report patient is mild to moderate risk. They believe she can proceed with surgery without any further cardiac testing. - Cont to hold Brilinta for at least 5 days prior to surgical intervention.   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 CAD, s/p stenting on Brilinta PAD DM Tobacco abuse HTN HLD GERD Hyponatremia    LOS: 2  days    Letha CapeKelly E Hannalee Castor , [facility] 09/12/2020, 11:12 AM Please see Amion for pager number during day hours 7:00am-4:30pm

## 2020-09-12 NOTE — Anesthesia Preprocedure Evaluation (Addendum)
Anesthesia Evaluation  Patient identified by MRN, date of birth, ID band Patient awake    Reviewed: Allergy & Precautions, NPO status , Patient's Chart, lab work & pertinent test results, reviewed documented beta blocker date and time   History of Anesthesia Complications Negative for: history of anesthetic complications  Airway Mallampati: II  TM Distance: >3 FB Neck ROM: Full    Dental  (+) Chipped, Dental Advisory Given   Pulmonary Current Smoker and Patient abstained from smoking.,    breath sounds clear to auscultation       Cardiovascular hypertension, Pt. on medications and Pt. on home beta blockers (-) angina+ CAD, + Past MI, + Cardiac Stents (RCA 2020) and + Peripheral Vascular Disease (carotid disease, SFA)   Rhythm:Regular Rate:Normal  09/10/2020 ECHO: Apical akinesis. Left ventricular ejection fraction, by estimation, 70 to 75%. 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), Insulin Dependent  Renal/GU negative Renal ROS     Musculoskeletal   Abdominal   Peds  Hematology Brilinta   Anesthesia Other Findings   Reproductive/Obstetrics                            Anesthesia Physical Anesthesia Plan  ASA: 3  Anesthesia Plan: General   Post-op Pain Management:    Induction: Intravenous  PONV Risk Score and Plan: 2 and Ondansetron and Dexamethasone  Airway Management Planned: Oral ETT  Additional Equipment: None  Intra-op Plan:   Post-operative Plan: Extubation in OR  Informed Consent: I have reviewed the patients History and Physical, chart, labs and discussed the procedure including the risks, benefits and alternatives for the proposed  anesthesia with the patient or authorized representative who has indicated his/her understanding and acceptance.     Dental advisory given  Plan Discussed with: CRNA and Surgeon  Anesthesia Plan Comments:        Anesthesia Quick Evaluation

## 2020-09-13 ENCOUNTER — Encounter (HOSPITAL_COMMUNITY): Payer: Self-pay | Admitting: Family Medicine

## 2020-09-13 ENCOUNTER — Inpatient Hospital Stay (HOSPITAL_COMMUNITY): Payer: Medicare PPO | Admitting: Anesthesiology

## 2020-09-13 ENCOUNTER — Encounter (HOSPITAL_COMMUNITY)
Admission: EM | Disposition: A | Discharge status: Home / Self Care | Payer: Self-pay | Source: Home / Self Care | Attending: Internal Medicine

## 2020-09-13 DIAGNOSIS — K810 Acute cholecystitis: Secondary | ICD-10-CM | POA: Diagnosis not present

## 2020-09-13 DIAGNOSIS — I2510 Atherosclerotic heart disease of native coronary artery without angina pectoris: Secondary | ICD-10-CM | POA: Diagnosis not present

## 2020-09-13 DIAGNOSIS — K8012 Calculus of gallbladder with acute and chronic cholecystitis without obstruction: Secondary | ICD-10-CM

## 2020-09-13 DIAGNOSIS — I10 Essential (primary) hypertension: Secondary | ICD-10-CM | POA: Diagnosis not present

## 2020-09-13 HISTORY — PX: CHOLECYSTECTOMY: SHX55

## 2020-09-13 LAB — BASIC METABOLIC PANEL
Anion gap: 8 (ref 5–15)
BUN: 8 mg/dL (ref 8–23)
CO2: 23 mmol/L (ref 22–32)
Calcium: 8 mg/dL — ABNORMAL LOW (ref 8.9–10.3)
Chloride: 91 mmol/L — ABNORMAL LOW (ref 98–111)
Creatinine, Ser: 0.8 mg/dL (ref 0.44–1.00)
GFR, Estimated: >60 mL/min (ref >60)
Glucose, Bld: 255 mg/dL — ABNORMAL HIGH (ref 70–99)
Potassium: 4 mmol/L (ref 3.5–5.1)
Sodium: 122 mmol/L — ABNORMAL LOW (ref 135–145)

## 2020-09-13 LAB — CBC
HCT: 31.3 % — ABNORMAL LOW (ref 36.0–46.0)
Hemoglobin: 11.2 g/dL — ABNORMAL LOW (ref 12.0–15.0)
MCH: 33.1 pg (ref 26.0–34.0)
MCHC: 35.8 g/dL (ref 30.0–36.0)
MCV: 92.6 fL (ref 80.0–100.0)
Platelets: 258 10*3/uL (ref 150–400)
RBC: 3.38 MIL/uL — ABNORMAL LOW (ref 3.87–5.11)
RDW: 13.6 % (ref 11.5–15.5)
WBC: 20.5 10*3/uL — ABNORMAL HIGH (ref 4.0–10.5)
nRBC: 0 % (ref 0.0–0.2)

## 2020-09-13 LAB — GLUCOSE, CAPILLARY
Glucose-Capillary: 292 mg/dL — ABNORMAL HIGH (ref 70–99)
Glucose-Capillary: 250 mg/dL — ABNORMAL HIGH (ref 70–99)
Glucose-Capillary: 381 mg/dL — ABNORMAL HIGH (ref 70–99)
Glucose-Capillary: 455 mg/dL — ABNORMAL HIGH (ref 70–99)
Glucose-Capillary: 434 mg/dL — ABNORMAL HIGH (ref 70–99)

## 2020-09-13 SURGERY — LAPAROSCOPIC CHOLECYSTECTOMY
Anesthesia: General | Site: Abdomen

## 2020-09-13 MED ORDER — DEXTROSE-NACL 5-0.9 % IV SOLN: INTRAVENOUS | Status: DC

## 2020-09-13 MED ORDER — SODIUM CHLORIDE 0.9 % IR SOLN
Status: DC | PRN
  Administered 2020-09-13 (×2): 1000 mL

## 2020-09-13 MED ORDER — PROPOFOL 10 MG/ML IV BOLUS
INTRAVENOUS | Status: AC
  Filled 2020-09-13: qty 20

## 2020-09-13 MED ORDER — METOPROLOL TARTRATE 50 MG PO TABS
50.0000 mg | ORAL_TABLET | Freq: Once | ORAL | Status: DC
  Filled 2020-09-13: qty 1

## 2020-09-13 MED ORDER — DEXAMETHASONE SODIUM PHOSPHATE 10 MG/ML IJ SOLN
INTRAMUSCULAR | Status: AC
  Filled 2020-09-13: qty 1

## 2020-09-13 MED ORDER — PROPOFOL 10 MG/ML IV BOLUS
INTRAVENOUS | Status: DC | PRN
Start: 2020-09-13 — End: 2020-09-13
  Administered 2020-09-13: 120 mg via INTRAVENOUS

## 2020-09-13 MED ORDER — LIDOCAINE 2% (20 MG/ML) 5 ML SYRINGE
INTRAMUSCULAR | Status: AC
  Filled 2020-09-13: qty 5

## 2020-09-13 MED ORDER — FENTANYL CITRATE (PF) 100 MCG/2ML IJ SOLN
INTRAMUSCULAR | Status: DC | PRN
  Administered 2020-09-13 (×2): 50 ug via INTRAVENOUS

## 2020-09-13 MED ORDER — ONDANSETRON HCL 4 MG/2ML IJ SOLN
INTRAMUSCULAR | Status: DC | PRN
  Administered 2020-09-13: 4 mg via INTRAVENOUS

## 2020-09-13 MED ORDER — METOPROLOL TARTRATE 50 MG PO TABS
50.0000 mg | ORAL_TABLET | Freq: Once | ORAL | Status: AC
  Administered 2020-09-13: 50 mg via ORAL

## 2020-09-13 MED ORDER — BUPIVACAINE HCL 0.25 % IJ SOLN
INTRAMUSCULAR | Status: DC | PRN
  Administered 2020-09-13: 8 mL

## 2020-09-13 MED ORDER — HYDROMORPHONE HCL 1 MG/ML IJ SOLN
1.0000 mg | INTRAMUSCULAR | Status: DC | PRN
  Administered 2020-09-13: 1 mg via INTRAVENOUS
  Filled 2020-09-13: qty 1

## 2020-09-13 MED ORDER — ORAL CARE MOUTH RINSE: 15.0000 mL | Freq: Once | OROMUCOSAL | Status: AC

## 2020-09-13 MED ORDER — ROCURONIUM BROMIDE 10 MG/ML (PF) SYRINGE
PREFILLED_SYRINGE | INTRAVENOUS | Status: DC | PRN
  Administered 2020-09-13: 10 mg via INTRAVENOUS
  Administered 2020-09-13: 60 mg via INTRAVENOUS

## 2020-09-13 MED ORDER — FENTANYL CITRATE (PF) 250 MCG/5ML IJ SOLN
INTRAMUSCULAR | Status: AC
  Filled 2020-09-13: qty 5

## 2020-09-13 MED ORDER — SUGAMMADEX SODIUM 200 MG/2ML IV SOLN
INTRAVENOUS | Status: DC | PRN
Start: 2020-09-13 — End: 2020-09-13
  Administered 2020-09-13: 200 mg via INTRAVENOUS

## 2020-09-13 MED ORDER — INSULIN ASPART 100 UNIT/ML IJ SOLN
7.0000 [IU] | Freq: Once | INTRAMUSCULAR | Status: AC
  Administered 2020-09-13: 7 [IU] via SUBCUTANEOUS

## 2020-09-13 MED ORDER — DEXAMETHASONE SODIUM PHOSPHATE 10 MG/ML IJ SOLN
INTRAMUSCULAR | Status: DC | PRN
  Administered 2020-09-13: 4 mg via INTRAVENOUS

## 2020-09-13 MED ORDER — ONDANSETRON HCL 4 MG/2ML IJ SOLN: 4.0000 mg | Freq: Four times a day (QID) | INTRAMUSCULAR | Status: DC | PRN

## 2020-09-13 MED ORDER — LIDOCAINE 2% (20 MG/ML) 5 ML SYRINGE
INTRAMUSCULAR | Status: DC | PRN
  Administered 2020-09-13: 20 mg via INTRAVENOUS

## 2020-09-13 MED ORDER — ROCURONIUM BROMIDE 10 MG/ML (PF) SYRINGE
PREFILLED_SYRINGE | INTRAVENOUS | Status: AC
  Filled 2020-09-13: qty 10

## 2020-09-13 MED ORDER — 0.9 % SODIUM CHLORIDE (POUR BTL) OPTIME
TOPICAL | Status: DC | PRN
  Administered 2020-09-13: 1000 mL

## 2020-09-13 MED ORDER — LACTATED RINGERS IV SOLN: INTRAVENOUS | Status: DC

## 2020-09-13 MED ORDER — TRAMADOL HCL 50 MG PO TABS: 50.0000 mg | ORAL_TABLET | Freq: Four times a day (QID) | ORAL | Status: DC | PRN

## 2020-09-13 MED ORDER — ONDANSETRON 4 MG PO TBDP: 4.0000 mg | ORAL_TABLET | Freq: Four times a day (QID) | ORAL | Status: DC | PRN

## 2020-09-13 MED ORDER — ONDANSETRON HCL 4 MG/2ML IJ SOLN
INTRAMUSCULAR | Status: AC
  Filled 2020-09-13: qty 2

## 2020-09-13 MED ORDER — BUPIVACAINE HCL (PF) 0.25 % IJ SOLN
INTRAMUSCULAR | Status: AC
  Filled 2020-09-13: qty 30

## 2020-09-13 MED ORDER — CHLORHEXIDINE GLUCONATE 0.12 % MT SOLN
15.0000 mL | Freq: Once | OROMUCOSAL | Status: AC
  Administered 2020-09-13: 15 mL via OROMUCOSAL
  Filled 2020-09-13: qty 15

## 2020-09-13 MED ORDER — OXYCODONE HCL 5 MG PO TABS
5.0000 mg | ORAL_TABLET | ORAL | Status: DC | PRN
  Administered 2020-09-14: 10 mg via ORAL
  Filled 2020-09-13 (×2): qty 1

## 2020-09-13 SURGICAL SUPPLY — 49 items
ADH SKN CLS APL DERMABOND .7 (GAUZE/BANDAGES/DRESSINGS) ×1
APL PRP STRL LF DISP 70% ISPRP (MISCELLANEOUS) ×1
APPLIER CLIP 5 13 M/L LIGAMAX5 (MISCELLANEOUS) ×2
APR CLP MED LRG 5 ANG JAW (MISCELLANEOUS) ×1
BAG COUNTER SPONGE SURGICOUNT (BAG) ×2 IMPLANT
BAG SPEC RTRVL 10 TROC 200 (ENDOMECHANICALS) ×1
BAG SPNG CNTER NS LX DISP (BAG) ×1
CANISTER SUCT 3000ML PPV (MISCELLANEOUS) ×2 IMPLANT
CHLORAPREP W/TINT 26 (MISCELLANEOUS) ×2 IMPLANT
CLIP APPLIE 5 13 M/L LIGAMAX5 (MISCELLANEOUS) IMPLANT
CLIP VESOLOCK MED LG 6/CT (CLIP) ×2 IMPLANT
COVER SURGICAL LIGHT HANDLE (MISCELLANEOUS) ×2 IMPLANT
COVER TRANSDUCER ULTRASND (DRAPES) ×2 IMPLANT
DERMABOND ADVANCED (GAUZE/BANDAGES/DRESSINGS) ×1
DERMABOND ADVANCED .7 DNX12 (GAUZE/BANDAGES/DRESSINGS) ×1 IMPLANT
ELECT REM PT RETURN 9FT ADLT (ELECTROSURGICAL) ×2
ELECTRODE REM PT RTRN 9FT ADLT (ELECTROSURGICAL) ×1 IMPLANT
ENDOLOOP SUT PDS II  0 18 (SUTURE) ×4
ENDOLOOP SUT PDS II 0 18 (SUTURE) IMPLANT
GLOVE SURG ENC MOIS LTX SZ7.5 (GLOVE) ×2 IMPLANT
GOWN STRL REUS W/ TWL LRG LVL3 (GOWN DISPOSABLE) ×2 IMPLANT
GOWN STRL REUS W/ TWL XL LVL3 (GOWN DISPOSABLE) ×1 IMPLANT
GOWN STRL REUS W/TWL LRG LVL3 (GOWN DISPOSABLE) ×4
GOWN STRL REUS W/TWL XL LVL3 (GOWN DISPOSABLE) ×2
GRASPER SUT TROCAR 14GX15 (MISCELLANEOUS) ×2 IMPLANT
HEMOSTAT SNOW SURGICEL 2X4 (HEMOSTASIS) ×1 IMPLANT
IV NS 1000ML (IV SOLUTION) ×4
IV NS 1000ML BAXH (IV SOLUTION) IMPLANT
KIT BASIN OR (CUSTOM PROCEDURE TRAY) ×2 IMPLANT
KIT TURNOVER KIT B (KITS) ×2 IMPLANT
NDL INSUFFLATION 14GA 120MM (NEEDLE) ×1 IMPLANT
NEEDLE INSUFFLATION 14GA 120MM (NEEDLE) ×2 IMPLANT
NS IRRIG 1000ML POUR BTL (IV SOLUTION) ×2 IMPLANT
PAD ARMBOARD 7.5X6 YLW CONV (MISCELLANEOUS) ×2 IMPLANT
POUCH LAPAROSCOPIC INSTRUMENT (MISCELLANEOUS) ×2 IMPLANT
POUCH RETRIEVAL ECOSAC 10 (ENDOMECHANICALS) IMPLANT
POUCH RETRIEVAL ECOSAC 10MM (ENDOMECHANICALS) ×2
SCISSORS LAP 5X35 DISP (ENDOMECHANICALS) ×2 IMPLANT
SET IRRIG TUBING LAPAROSCOPIC (IRRIGATION / IRRIGATOR) ×2 IMPLANT
SET TUBE SMOKE EVAC HIGH FLOW (TUBING) ×2 IMPLANT
SLEEVE ENDOPATH XCEL 5M (ENDOMECHANICALS) ×2 IMPLANT
SPECIMEN JAR SMALL (MISCELLANEOUS) ×2 IMPLANT
SUT MNCRL AB 4-0 PS2 18 (SUTURE) ×2 IMPLANT
TOWEL GREEN STERILE (TOWEL DISPOSABLE) ×2 IMPLANT
TOWEL GREEN STERILE FF (TOWEL DISPOSABLE) ×2 IMPLANT
TRAY LAPAROSCOPIC MC (CUSTOM PROCEDURE TRAY) ×2 IMPLANT
TROCAR XCEL NON-BLD 11X100MML (ENDOMECHANICALS) ×2 IMPLANT
TROCAR XCEL NON-BLD 5MMX100MML (ENDOMECHANICALS) ×2 IMPLANT
WARMER LAPAROSCOPE (MISCELLANEOUS) ×2 IMPLANT

## 2020-09-13 NOTE — Anesthesia Procedure Notes (Addendum)
Procedure Name: Intubation Date/Time: 09/13/2020 7:52 AM Performed by: Barrington Ellison, CRNA Pre-anesthesia Checklist: Patient identified, Emergency Drugs available, Suction available and Patient being monitored Patient Re-evaluated:Patient Re-evaluated prior to induction Oxygen Delivery Method: Circle System Utilized Preoxygenation: Pre-oxygenation with 100% oxygen Induction Type: IV induction Ventilation: Mask ventilation without difficulty Laryngoscope Size: Glidescope and 3 Grade View: Grade I Tube type: Oral Number of attempts: 3 Airway Equipment and Method: Oral airway, Rigid stylet and Video-laryngoscopy Placement Confirmation: ETT inserted through vocal cords under direct vision, positive ETCO2 and breath sounds checked- equal and bilateral Secured at: 22 cm Tube secured with: Tape Dental Injury: Teeth and Oropharynx as per pre-operative assessment  Difficulty Due To: Difficulty was unanticipated, Difficult Airway- due to limited oral opening and Difficult Airway- due to anterior larynx Future Recommendations: Recommend- induction with short-acting agent, and alternative techniques readily available Comments: DL x 1 Mac 3, no view, DL #2 Mac 3 by MDA, Grade 4 view, DL #3 Glidescope used with grade 1 view. Recommend use Videoscope for future intubations

## 2020-09-13 NOTE — Anesthesia Postprocedure Evaluation (Signed)
Anesthesia Post Note  Patient: Sarah Francis  Procedure(s) Performed: LAPAROSCOPIC CHOLECYSTECTOMY (Abdomen)     Patient location during evaluation: PACU Anesthesia Type: General Level of consciousness: awake and alert, patient cooperative and oriented Pain management: pain level controlled Vital Signs Assessment: post-procedure vital signs reviewed and stable Respiratory status: spontaneous breathing, nonlabored ventilation and respiratory function stable Cardiovascular status: blood pressure returned to baseline and stable Postop Assessment: no apparent nausea or vomiting Anesthetic complications: no   No notable events documented.  Last Vitals:  Vitals:   09/13/20 0943 09/13/20 0957  BP: (!) 145/63 (!) 166/54  Pulse: 81 82  Resp: (!) 24 (!) 21  Temp:    SpO2: 97% 97%    Last Pain:  Vitals:   09/13/20 0957  TempSrc:   PainSc: 0-No pain                 Mykiah Schmuck,E. Serine Kea

## 2020-09-13 NOTE — Progress Notes (Signed)
Day of Surgery   Subjective/Chief Complaint: Pt doing well this AM    Objective: Vital signs in last 24 hours: Temp:  [97.5 F (36.4 C)-100 F (37.8 C)] 98 F (36.7 C) (08/20 0716) Pulse Rate:  [73-90] 90 (08/20 0716) Resp:  [16-20] 18 (08/20 0716) BP: (142-187)/(59-76) 187/75 (08/20 0716) SpO2:  [94 %-98 %] 98 % (08/20 0452) Last BM Date: 09/11/20  Intake/Output from previous day: 08/19 0701 - 08/20 0700 In: 360 [P.O.:360] Out: -  Intake/Output this shift: No intake/output data recorded.  PE:  Constitutional: No acute distress, conversant, appears states age. Eyes: Anicteric sclerae, moist conjunctiva, no lid lag Lungs: Clear to auscultation bilaterally, normal respiratory effort CV: regular rate and rhythm, no murmurs, no peripheral edema, pedal pulses 2+ GI: Soft, no masses or hepatosplenomegaly, non-tender to palpation Skin: No rashes, palpation reveals normal turgor Psychiatric: appropriate judgment and insight, oriented to person, place, and time   Lab Results:  Recent Labs    09/12/20 0218 09/13/20 0110  WBC 21.3* 20.5*  HGB 11.2* 11.2*  HCT 31.8* 31.3*  PLT 239 258   BMET Recent Labs    09/12/20 0218 09/13/20 0110  NA 125* 122*  K 3.2* 4.0  CL 91* 91*  CO2 24 23  GLUCOSE 208* 255*  BUN 8 8  CREATININE 0.77 0.80  CALCIUM 8.3* 8.0*   PT/INR No results for input(s): LABPROT, INR in the last 72 hours. ABG No results for input(s): PHART, HCO3 in the last 72 hours.  Invalid input(s): PCO2, PO2  Studies/Results: No results found.  Anti-infectives: Anti-infectives (From admission, onward)    Start     Dose/Rate Route Frequency Ordered Stop   09/10/20 0400  [MAR Hold]  ciprofloxacin (CIPRO) IVPB 400 mg        (MAR Hold since Sat 09/13/2020 at 0656.Hold Reason: Transfer to a Procedural area)  See Hyperspace for full Linked Orders Report.   400 mg 200 mL/hr over 60 Minutes Intravenous Every 12 hours 09/09/20 1844     09/10/20 0015  [MAR Hold]   metroNIDAZOLE (FLAGYL) IVPB 500 mg        (MAR Hold since Sat 09/13/2020 at 0656.Hold Reason: Transfer to a Procedural area)  See Hyperspace for full Linked Orders Report.   500 mg 100 mL/hr over 60 Minutes Intravenous Every 8 hours 09/09/20 1844     09/09/20 1530  ciprofloxacin (CIPRO) IVPB 400 mg       See Hyperspace for full Linked Orders Report.   400 mg 200 mL/hr over 60 Minutes Intravenous  Once 09/09/20 1515 09/09/20 1814   09/09/20 1530  metroNIDAZOLE (FLAGYL) IVPB 500 mg       See Hyperspace for full Linked Orders Report.   500 mg 100 mL/hr over 60 Minutes Intravenous  Once 09/09/20 1515 09/09/20 1814       Assessment/Plan: Cholecystitis To OR for lap chole All risks and benefits were discussed with the patient to generally include: infection, bleeding, possible need for post op ERCP, damage to the bile ducts, and bile leak. Alternatives were offered and described.  All questions were answered and the patient voiced understanding of the procedure and wishes to proceed at this point with a laparoscopic cholecystectomy   LOS: 3 days    Axel Filler 09/13/2020

## 2020-09-13 NOTE — Progress Notes (Signed)
Will be available 

## 2020-09-13 NOTE — Transfer of Care (Signed)
Immediate Anesthesia Transfer of Care Note  Patient: Sarah Francis  Procedure(s) Performed: LAPAROSCOPIC CHOLECYSTECTOMY (Abdomen)  Patient Location: PACU  Anesthesia Type:General  Level of Consciousness: oriented and drowsy  Airway & Oxygen Therapy: Patient Spontanous Breathing  Post-op Assessment: Report given to RN  Post vital signs: Reviewed and stable  Last Vitals:  Vitals Value Taken Time  BP 150/67 09/13/20 0927  Temp    Pulse 79 09/13/20 0927  Resp 13 09/13/20 0927  SpO2 92 % 09/13/20 0927  Vitals shown include unvalidated device data.  Last Pain:  Vitals:   09/13/20 0716  TempSrc: Oral  PainSc:       Patients Stated Pain Goal: 3 (09/12/20 1224)  Complications: No notable events documented.

## 2020-09-13 NOTE — Progress Notes (Addendum)
PROGRESS NOTE  Sarah Francis YBW:[id number] DOB: 1945-02-02 DOA: 09/09/2020 PCP: Gweneth Dimitri, MD  HPI/Recap of past 50 hours: 75 year old female with history of Queen artery disease on aspirin and Brilinta, peripheral artery disease, hypertension, hyperlipidemia, GERD, type 2 diabetes mellitus, she was sent from her primary care provider office due to abdominal pain back pain and nausea vomiting, she initially presented to the ED emergency department on September 07, 2020 had abdominal CT which showed 2.2 cm gallstone in the gallbladder neck and wall thickening without. Cholecystitis and was allowed to be discharged home but returned with worsening symptoms and was admitted  September 13, 2020: Patient seen and examined at bedside she had just come back from gallbladder surgery this morning.  She is eating and states she has been looking forward to eating for very long time she tolerated her food well Dr. Lenise Arena at bedside she only complains of some pain at the incision site.  Assessment/Plan: Principal Problem:   Gallstones without obstruction of gallbladder Active Problems:   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HCC)   Mixed hyperlipidemia   Vitamin D deficiency   Restless legs syndrome (RLS)   Essential hypertension   Peripheral arterial disease (HCC)   CAD (coronary artery disease)   Carotid artery stenosis   PAD (peripheral artery disease) (HCC)   HLD (hyperlipidemia)   Abdominal pain   Acute cholecystitis  #1 acute cholecystitis with cholelithiasis patient had a positive HIDA scan. She went to the OR today for lap chole patient is doing well.  Continue Cipro patient has started feeding as she is tolerating feeds well  2.  Coronary disease SP stents with PCI DES May 2020 patient is on Brilinta and aspirin and beta-blocker but it we will hold Brilinta for surgery  3.  Bilateral carotid artery carotid artery disease  4.  Peripheral vascular disease on aspirin and  statin Hyperlipidemia on statin Hypertension uncontrolled continue lisinopril metoprolol  5.  Type 2 diabetes mellitus on Lantus 30 units twice daily poorly controlled  6.  Leukocytosis secondary to cholecystitis continue IV Cipro and monitor Code Status: Full  Severity of Illness: The appropriate patient status for this patient is INPATIENT. Inpatient status is judged to be reasonable and necessary in order to provide the required intensity of service to ensure the patient's safety. The patient's presenting symptoms, physical exam findings, and initial radiographic and laboratory data in the context of their chronic comorbidities is felt to place them at high risk for further clinical deterioration. Furthermore, it is not anticipated that the patient will be medically stable for discharge from the hospital within 2 midnights of admission. The following factors support the patient status of inpatient.   " Status post LAP CHOLE  * I certify that at the point of admission it is my clinical judgment that the patient will require inpatient hospital care spanning beyond 2 midnights from the point of admission due to high intensity of service, high risk for further deterioration and high frequency of surveillance required.*   Family Communication: Daughter Marchelle Folks at bedside  Disposition Plan: Home Status is: Inpatient   Dispo: The patient is from: Home              Anticipated d/c is to:               Anticipated d/c date is: 09/14/20              Patient currently not medically stable for discharge  Consultants: General surgery  Procedures:  Lap chole  Antimicrobials: Cipro  DVT prophylaxis: Lovenox   Objective: Vitals:   09/13/20 0452 09/13/20 0716 09/13/20 0927 09/13/20 0943  BP: (!) 150/59 (!) 187/75 (!) 150/67 (!) 145/63  Pulse: 86 90 81 81  Resp:  18 13 (!) 24  Temp: 98.5 F (36.9 C) 98 F (36.7 C) 97.6 F (36.4 C)   TempSrc: Oral Oral    SpO2: 98%  92% 97%  Weight:       Height:        Intake/Output Summary (Last 24 hours) at 09/13/2020 0952 Last data filed at 09/13/2020 0920 Gross per 24 hour  Intake 1060 ml  Output 10 ml  Net 1050 ml   Filed Weights   09/09/20 1148  Weight: 70.3 kg   Body mass index is 28.35 kg/m.  Exam:  General: 75 y.o. year-old female well developed well nourished in no acute distress.  Alert and oriented x3.  Obese Cardiovascular: Regular rate and rhythm with no rubs or gallops.  No thyromegaly or JVD noted.   Respiratory: Clear to auscultation with no wheezes or rales. Good inspiratory effort. Abdomen: Soft slight tender at incision sites ondistended with normal bowel sounds x4 quadrants. Musculoskeletal: No lower extremity edema. 2/4 pulses in all 4 extremities. Skin: No ulcerative lesions noted or rashes, Psychiatry: Mood is appropriate for condition and setting Neurology: Alert oriented x3    Data Reviewed: CBC: Recent Labs  Lab 09/07/20 0447 09/07/20 0504 09/09/20 1210 09/09/20 2032 09/10/20 0126 09/11/20 0351 09/12/20 0218 09/13/20 0110  WBC 14.1*  --  27.1* 26.4* 27.8* 24.2* 21.3* 20.5*  NEUTROABS 11.6*  --  25.2*  --   --   --   --   --   HGB 13.9   < > 14.1 13.1 12.8 12.7 11.2* 11.2*  HCT 40.7   < > 40.2 37.3 37.0 35.3* 31.8* 31.3*  MCV 97.6  --  94.1 94.7 94.4 92.2 93.0 92.6  PLT 270  --  248 220 224 230 239 258   < > = values in this interval not displayed.   Basic Metabolic Panel: Recent Labs  Lab 09/09/20 2032 09/10/20 0126 09/11/20 0351 09/12/20 0218 09/13/20 0110  NA 125* 124* 125* 125* 122*  K 3.5 3.6 3.7 3.2* 4.0  CL 92* 91* 88* 91* 91*  CO2 21* 20* 24 24 23   GLUCOSE 157* 192* 128* 208* 255*  BUN 8 8 7* 8 8  CREATININE 0.60 0.72 0.71 0.77 0.80  CALCIUM 8.2* 8.2* 8.5* 8.3* 8.0*   GFR: Estimated Creatinine Clearance: 57.5 mL/min (by C-G formula based on SCr of 0.8 mg/dL). Liver Function Tests: Recent Labs  Lab 09/07/20 0447 09/09/20 1210 09/10/20 0126 09/11/20 0351  09/12/20 0218  AST 32 17 25 23 22   ALT 43 26 26 25 21   ALKPHOS 64 75 93 85 102  BILITOT 1.0 1.4* 1.2 0.9 0.6  PROT 7.0 7.2 6.3* 5.9* 5.5*  ALBUMIN 4.0 3.4* 3.0* 2.6* 2.3*   Recent Labs  Lab 09/07/20 0447 09/09/20 1210  LIPASE 29 27   No results for input(s): AMMONIA in the last 168 hours. Coagulation Profile: Recent Labs  Lab 09/07/20 0447  INR 0.9   Cardiac Enzymes: No results for input(s): CKTOTAL, CKMB, CKMBINDEX, TROPONINI in the last 168 hours. BNP (last 3 results) No results for input(s): PROBNP in the last 8760 hours. HbA1C: No results for input(s): HGBA1C in the last 72 hours. CBG: Recent Labs  Lab 09/12/20 1629 09/12/20 2042 09/12/20 2234 09/13/20  0557 09/13/20 0927  GLUCAP 147* 439* 320* 292* 250*   Lipid Profile: No results for input(s): CHOL, HDL, LDLCALC, TRIG, CHOLHDL, LDLDIRECT in the last 72 hours. Thyroid Function Tests: Recent Labs    09/12/20 0218  TSH 1.502   Anemia Panel: No results for input(s): VITAMINB12, FOLATE, FERRITIN, TIBC, IRON, RETICCTPCT in the last 72 hours. Urine analysis:    Component Value Date/Time   COLORURINE YELLOW 09/09/2020 1403   APPEARANCEUR HAZY (A) 09/09/2020 1403   LABSPEC 1.016 09/09/2020 1403   PHURINE 7.0 09/09/2020 1403   GLUCOSEU 150 (A) 09/09/2020 1403   HGBUR SMALL (A) 09/09/2020 1403   BILIRUBINUR NEGATIVE 09/09/2020 1403   KETONESUR 80 (A) 09/09/2020 1403   PROTEINUR 30 (A) 09/09/2020 1403   NITRITE NEGATIVE 09/09/2020 1403   LEUKOCYTESUR TRACE (A) 09/09/2020 1403   Sepsis Labs: (procalcitonin:4,lacticidven:4)  ) Recent Results (from the past 240 hour(s))  Resp Panel by RT-PCR (Flu A&B, Covid) Nasopharyngeal Swab     Status: None   Collection Time: 09/07/20  4:55 AM   Specimen: Nasopharyngeal Swab; Nasopharyngeal(NP) swabs in vial transport medium  Result Value Ref Range Status   SARS Coronavirus 2 by RT PCR NEGATIVE NEGATIVE Final    Comment: (NOTE) SARS-CoV-2 target nucleic  acids are NOT DETECTED.  The SARS-CoV-2 RNA is generally detectable in upper respiratory specimens during the acute phase of infection. The lowest concentration of SARS-CoV-2 viral copies this assay can detect is 138 copies/mL. A negative result does not preclude SARS-Cov-2 infection and should not be used as the sole basis for treatment or other patient management decisions. A negative result may occur with  improper specimen collection/handling, submission of specimen other than nasopharyngeal swab, presence of viral mutation(s) within the areas targeted by this assay, and inadequate number of viral copies(<138 copies/mL). A negative result must be combined with clinical observations, patient history, and epidemiological information. The expected result is Negative.  Fact Sheet for Patients:  BloggerCourse.com  Fact Sheet for Healthcare Providers:  SeriousBroker.it  This test is no t yet approved or cleared by the [place] FDA and  has been authorized for detection and/or diagnosis of SARS-CoV-2 by FDA under an Emergency Use Authorization (EUA). This EUA will remain  in effect (meaning this test can be used) for the duration of the COVID-19 declaration under Section 564(b)(1) of the Act, 21 U.S.C.section 360bbb-3(b)(1), unless the authorization is terminated  or revoked sooner.       Influenza A by PCR NEGATIVE NEGATIVE Final   Influenza B by PCR NEGATIVE NEGATIVE Final    Comment: (NOTE) The Xpert Xpress SARS-CoV-2/FLU/RSV plus assay is intended as an aid in the diagnosis of influenza from Nasopharyngeal swab specimens and should not be used as a sole basis for treatment. Nasal washings and aspirates are unacceptable for Xpert Xpress SARS-CoV-2/FLU/RSV testing.  Fact Sheet for Patients: BloggerCourse.com  Fact Sheet for Healthcare Providers: SeriousBroker.it  This  test is not yet approved or cleared by the [place] FDA and has been authorized for detection and/or diagnosis of SARS-CoV-2 by FDA under an Emergency Use Authorization (EUA). This EUA will remain in effect (meaning this test can be used) for the duration of the COVID-19 declaration under Section 564(b)(1) of the Act, 21 U.S.C. section 360bbb-3(b)(1), unless the authorization is terminated or revoked.  Performed at [facility], [address] [place], [place] [address]   Urine Culture     Status: Abnormal   Collection Time: 09/09/20  2:37 PM  Specimen: Urine, Clean Catch  Result Value Ref Range Status   Specimen Description URINE, CLEAN CATCH  Final   Special Requests   Final    NONE Performed at [facility], [address], [place], [place] [address]    Culture MULTIPLE SPECIES PRESENT, SUGGEST RECOLLECTION (A)  Final   Report Status 09/10/2020 FINAL  Final  Resp Panel by RT-PCR (Flu A&B, Covid) Nasopharyngeal Swab     Status: None   Collection Time: 09/09/20  3:38 PM   Specimen: Nasopharyngeal Swab; Nasopharyngeal(NP) swabs in vial transport medium  Result Value Ref Range Status   SARS Coronavirus 2 by RT PCR NEGATIVE NEGATIVE Final    Comment: (NOTE) SARS-CoV-2 target nucleic acids are NOT DETECTED.  The SARS-CoV-2 RNA is generally detectable in upper respiratory specimens during the acute phase of infection. The lowest concentration of SARS-CoV-2 viral copies this assay can detect is 138 copies/mL. A negative result does not preclude SARS-Cov-2 infection and should not be used as the sole basis for treatment or other patient management decisions. A negative result may occur with  improper specimen collection/handling, submission of specimen other than nasopharyngeal swab, presence of viral mutation(s) within the areas targeted by this assay, and inadequate number of viral copies(<138 copies/mL). A negative result must be combined with clinical  observations, patient history, and epidemiological information. The expected result is Negative.  Fact Sheet for Patients:  BloggerCourse.com  Fact Sheet for Healthcare Providers:  SeriousBroker.it  This test is no t yet approved or cleared by the [place] FDA and  has been authorized for detection and/or diagnosis of SARS-CoV-2 by FDA under an Emergency Use Authorization (EUA). This EUA will remain  in effect (meaning this test can be used) for the duration of the COVID-19 declaration under Section 564(b)(1) of the Act, 21 U.S.C.section 360bbb-3(b)(1), unless the authorization is terminated  or revoked sooner.       Influenza A by PCR NEGATIVE NEGATIVE Final   Influenza B by PCR NEGATIVE NEGATIVE Final    Comment: (NOTE) The Xpert Xpress SARS-CoV-2/FLU/RSV plus assay is intended as an aid in the diagnosis of influenza from Nasopharyngeal swab specimens and should not be used as a sole basis for treatment. Nasal washings and aspirates are unacceptable for Xpert Xpress SARS-CoV-2/FLU/RSV testing.  Fact Sheet for Patients: BloggerCourse.com  Fact Sheet for Healthcare Providers: SeriousBroker.it  This test is not yet approved or cleared by the [place] FDA and has been authorized for detection and/or diagnosis of SARS-CoV-2 by FDA under an Emergency Use Authorization (EUA). This EUA will remain in effect (meaning this test can be used) for the duration of the COVID-19 declaration under Section 564(b)(1) of the Act, 21 U.S.C. section 360bbb-3(b)(1), unless the authorization is terminated or revoked.  Performed at [facility], [address] [place], [place] [address]   Culture, blood (routine x 2)     Status: None (Preliminary result)   Collection Time: 09/09/20  8:33 PM   Specimen: BLOOD RIGHT HAND  Result Value Ref Range Status   Specimen Description BLOOD  RIGHT HAND  Final   Special Requests   Final    BOTTLES DRAWN AEROBIC AND ANAEROBIC Blood Culture results may not be optimal due to an inadequate volume of blood received in culture bottles   Culture   Final    NO GROWTH 4 DAYS Performed at [facility], [address], [place], [place] [address]    Report Status PENDING  Incomplete  Culture, blood (routine x 2)     Status: None (Preliminary result)   Collection Time: 09/10/20  1:20 AM   Specimen: BLOOD RIGHT ARM  Result Value Ref Range Status   Specimen Description BLOOD RIGHT ARM  Final   Special Requests   Final    BOTTLES DRAWN AEROBIC AND ANAEROBIC Blood Culture adequate volume   Culture   Final    NO GROWTH 3 DAYS Performed at [facility], [address] [place], [place] [address]    Report Status PENDING  Incomplete  Surgical pcr screen     Status: None   Collection Time: 09/11/20 12:20 AM   Specimen: Nasal Mucosa; Nasal Swab  Result Value Ref Range Status   MRSA, PCR NEGATIVE NEGATIVE Final   Staphylococcus aureus NEGATIVE NEGATIVE Final    Comment: (NOTE) The Xpert SA Assay (FDA approved for NASAL specimens in patients 73 years of age and older), is one component of a comprehensive surveillance program. It is not intended to diagnose infection nor to guide or monitor treatment. Performed at [facility], [address] [place], [place] [address]       Studies: No results found.  Scheduled Meds:  [MAR Hold] aspirin EC  81 mg Oral Daily   [MAR Hold] atorvastatin  80 mg Oral QHS   [MAR Hold] cholecalciferol  2,000 Units Oral Daily   [MAR Hold] cycloSPORINE  1 drop Both Eyes BID   [MAR Hold] enoxaparin (LOVENOX) injection  40 mg Subcutaneous Q24H   [MAR Hold] insulin aspart  0-15 Units Subcutaneous TID WC   [MAR Hold] insulin aspart  0-5 Units Subcutaneous QHS   [MAR Hold] insulin glargine-yfgn  10 Units Subcutaneous Daily   [MAR Hold] lisinopril  5 mg Oral Daily   [MAR Hold] loratadine  10  mg Oral Daily   [MAR Hold] metoprolol tartrate  50 mg Oral BID   [MAR Hold] pantoprazole (PROTONIX) IV  40 mg Intravenous Q12H   [MAR Hold] thiamine  100 mg Oral Daily    Continuous Infusions:  [MAR Hold] ciprofloxacin 400 mg (09/13/20 0442)   And   [MAR Hold] metronidazole 500 mg (09/12/20 2350)   lactated ringers 10 mL/hr at 09/13/20 0711     LOS: 3 days     Myrtie NeitherNwannadiya Avalie Oconnor, MD [facility]  To reach me or the doctor on call, go to: [email or web address] Password [email or web address]  09/13/2020, 9:52 AM

## 2020-09-13 NOTE — Op Note (Signed)
09/13/2020  9:12 AM  PATIENT:  Sarah Francis  75 y.o. female  PRE-OPERATIVE DIAGNOSIS:  CHOLECYSTITIS  POST-OPERATIVE DIAGNOSIS:  acute, necrotic, CHOLECYSTITIS  PROCEDURE:  Procedure(s): LAPAROSCOPIC CHOLECYSTECTOMY (N/A)  SURGEON:  Surgeon(s) and Role:    * Axel Filler, MD - Primary  ANESTHESIA:   local and general  EBL:  minimal   BLOOD ADMINISTERED:none  DRAINS: none   LOCAL MEDICATIONS USED:  BUPIVICAINE   SPECIMEN:  Source of Specimen:  gallbladder  DISPOSITION OF SPECIMEN:  N/A  COUNTS:  YES  TOURNIQUET:  * No tourniquets in log *  DICTATION: .Dragon Dictation The patient was taken to the operating and placed in the supine position with bilateral SCDs in place.  The patient was prepped and draped in the usual sterile fashion. A time out was called and all facts were verified. A pneumoperitoneum was obtained via A Veress needle technique to a pressure of 30mm of mercury.  A 20mm trochar was then placed in the right upper quadrant under visualization, and there were no injuries to any abdominal organs. A 11 mm port was then placed in the umbilical region after infiltrating with local anesthesia under direct visualization. A second and third epigastric port and right lower quadrant port placement under direct visualization, respectively.    The gallbladder was identified and seen to be very inflamed and necrotic at the infinibulum.  It was drained and retracted, the peritoneum was then sharply dissected from the gallbladder and this dissection was carried down to Calot's triangle.  There was a significant amount of inflammation and to the gallbladder and down to Calot's triangle.  At this time I decided to proceed in a dome down fashion.  I did this with cautery to maintain hemostasis. As I worked my way down to the cystic duct area I placed a 0 PDS endoloop x2 at the neck of the gallbladder.  I cut the gallbladder distal to this.  There was a large stone at the neck of  the gallbladder.   The cystic artery was not identified formally. A retrieval bag was then placed in the abdomen and gallbladder placed in the bag. The hepatic fossa was then reexamined and hemostasis was achieved with Bovie cautery and was excellent at the end of the case. To help with hemostasis I placed snow hemastatic agent in the subhepatic fossa.  The subhepatic fossa and perihepatic fossa was then irrigated until the effluent was clear.  The gallbladder and bag were removed from the abdominal cavity. The 11 mm trocar fascia was reapproximated with the Endo Close #1 Vicryl x2.  The pneumoperitoneum was evacuated and all trochars removed under direct visulalization.  The skin was then closed with 4-0 Monocryl and the skin dressed with Dermabond.    The patient was awaken from general anesthesia and taken to the recovery room in stable condition.    PLAN OF CARE: Admit to inpatient   PATIENT DISPOSITION:  PACU - hemodynamically stable.   Delay start of Pharmacological VTE agent (>24hrs) due to surgical blood loss or risk of bleeding: yes

## 2020-09-13 NOTE — Progress Notes (Signed)
Pt or OR

## 2020-09-14 ENCOUNTER — Encounter (HOSPITAL_COMMUNITY): Payer: Self-pay | Admitting: General Surgery

## 2020-09-14 DIAGNOSIS — K810 Acute cholecystitis: Secondary | ICD-10-CM | POA: Diagnosis not present

## 2020-09-14 DIAGNOSIS — R1012 Left upper quadrant pain: Secondary | ICD-10-CM | POA: Diagnosis not present

## 2020-09-14 DIAGNOSIS — K8000 Calculus of gallbladder with acute cholecystitis without obstruction: Secondary | ICD-10-CM | POA: Diagnosis not present

## 2020-09-14 LAB — GLUCOSE, CAPILLARY
Glucose-Capillary: 410 mg/dL — ABNORMAL HIGH (ref 70–99)
Glucose-Capillary: 393 mg/dL — ABNORMAL HIGH (ref 70–99)
Glucose-Capillary: 403 mg/dL — ABNORMAL HIGH (ref 70–99)
Glucose-Capillary: 279 mg/dL — ABNORMAL HIGH (ref 70–99)

## 2020-09-14 LAB — CULTURE, BLOOD (ROUTINE X 2): Culture: NO GROWTH

## 2020-09-14 LAB — COMPREHENSIVE METABOLIC PANEL
ALT: 21 U/L (ref 0–44)
AST: 29 U/L (ref 15–41)
Albumin: 2 g/dL — ABNORMAL LOW (ref 3.5–5.0)
Alkaline Phosphatase: 105 U/L (ref 38–126)
Anion gap: 6 (ref 5–15)
BUN: 11 mg/dL (ref 8–23)
CO2: 24 mmol/L (ref 22–32)
Calcium: 7.9 mg/dL — ABNORMAL LOW (ref 8.9–10.3)
Chloride: 97 mmol/L — ABNORMAL LOW (ref 98–111)
Creatinine, Ser: 0.88 mg/dL (ref 0.44–1.00)
GFR, Estimated: >60 mL/min (ref >60)
Glucose, Bld: 410 mg/dL — ABNORMAL HIGH (ref 70–99)
Potassium: 4.4 mmol/L (ref 3.5–5.1)
Sodium: 127 mmol/L — ABNORMAL LOW (ref 135–145)
Total Bilirubin: 0.4 mg/dL (ref 0.3–1.2)
Total Protein: 4.9 g/dL — ABNORMAL LOW (ref 6.5–8.1)

## 2020-09-14 MED ORDER — ONDANSETRON HCL 4 MG PO TABS: 4.0000 mg | ORAL_TABLET | Freq: Four times a day (QID) | ORAL | 0 refills | Status: AC | PRN

## 2020-09-14 MED ORDER — ACETAMINOPHEN 325 MG PO TABS: 650.0000 mg | ORAL_TABLET | Freq: Four times a day (QID) | ORAL | 0 refills | Status: DC | PRN

## 2020-09-14 MED ORDER — OXYCODONE HCL 5 MG PO TABS: 5.0000 mg | ORAL_TABLET | Freq: Four times a day (QID) | ORAL | 0 refills | Status: DC | PRN

## 2020-09-14 NOTE — Progress Notes (Signed)
Spoke with Dr. Barrie Folk on the unit, gave verbal order to give 15 units of insulin (sliding scale) for blood glucose of 403.

## 2020-09-14 NOTE — Plan of Care (Signed)

## 2020-09-14 NOTE — Plan of Care (Signed)
  Problem: Education: Goal: Knowledge of General Education information will improve Description: Including pain rating scale, medication(s)/side effects and non-pharmacologic comfort measures 09/14/2020 1542 by Rosalie Doctor, RN Outcome: Adequate for Discharge 09/14/2020 1341 by Rosalie Doctor, RN Outcome: Progressing   Problem: Health Behavior/Discharge Planning: Goal: Ability to manage health-related needs will improve 09/14/2020 1542 by Rosalie Doctor, RN Outcome: Adequate for Discharge 09/14/2020 1341 by Rosalie Doctor, RN Outcome: Progressing   Problem: Clinical Measurements: Goal: Ability to maintain clinical measurements within normal limits will improve 09/14/2020 1542 by Rosalie Doctor, RN Outcome: Adequate for Discharge 09/14/2020 1341 by Rosalie Doctor, RN Outcome: Progressing Goal: Will remain free from infection 09/14/2020 1542 by Rosalie Doctor, RN Outcome: Adequate for Discharge 09/14/2020 1341 by Rosalie Doctor, RN Outcome: Progressing Goal: Diagnostic test results will improve 09/14/2020 1542 by Rosalie Doctor, RN Outcome: Adequate for Discharge 09/14/2020 1341 by Rosalie Doctor, RN Outcome: Progressing Goal: Respiratory complications will improve 09/14/2020 1542 by Rosalie Doctor, RN Outcome: Adequate for Discharge 09/14/2020 1341 by Rosalie Doctor, RN Outcome: Progressing Goal: Cardiovascular complication will be avoided 09/14/2020 1542 by Rosalie Doctor, RN Outcome: Adequate for Discharge 09/14/2020 1341 by Rosalie Doctor, RN Outcome: Progressing   Problem: Activity: Goal: Risk for activity intolerance will decrease 09/14/2020 1542 by Rosalie Doctor, RN Outcome: Adequate for Discharge 09/14/2020 1341 by Rosalie Doctor, RN Outcome: Progressing   Problem: Nutrition: Goal: Adequate nutrition will be maintained 09/14/2020 1542 by Rosalie Doctor, RN Outcome: Adequate for Discharge 09/14/2020 1341 by  Rosalie Doctor, RN Outcome: Progressing   Problem: Coping: Goal: Level of anxiety will decrease 09/14/2020 1542 by Rosalie Doctor, RN Outcome: Adequate for Discharge 09/14/2020 1341 by Rosalie Doctor, RN Outcome: Progressing   Problem: Elimination: Goal: Will not experience complications related to bowel motility 09/14/2020 1542 by Rosalie Doctor, RN Outcome: Adequate for Discharge 09/14/2020 1341 by Rosalie Doctor, RN Outcome: Progressing Goal: Will not experience complications related to urinary retention 09/14/2020 1542 by Rosalie Doctor, RN Outcome: Adequate for Discharge 09/14/2020 1341 by Rosalie Doctor, RN Outcome: Progressing   Problem: Pain Managment: Goal: General experience of comfort will improve 09/14/2020 1542 by Rosalie Doctor, RN Outcome: Adequate for Discharge 09/14/2020 1341 by Rosalie Doctor, RN Outcome: Progressing   Problem: Safety: Goal: Ability to remain free from injury will improve 09/14/2020 1542 by Rosalie Doctor, RN Outcome: Adequate for Discharge 09/14/2020 1341 by Rosalie Doctor, RN Outcome: Progressing   Problem: Skin Integrity: Goal: Risk for impaired skin integrity will decrease 09/14/2020 1542 by Rosalie Doctor, RN Outcome: Adequate for Discharge 09/14/2020 1341 by Rosalie Doctor, RN Outcome: Progressing

## 2020-09-14 NOTE — Progress Notes (Addendum)
1 Day Post-Op lap cholecystectomy Subjective: Feels "sore", denies nausea  Objective: Vital signs in last 24 hours: Temp:  [97.6 F (36.4 C)-98.1 F (36.7 C)] 97.7 F (36.5 C) (08/21 0524) Pulse Rate:  [72-82] 82 (08/21 0524) Resp:  [13-24] 17 (08/21 0524) BP: (135-166)/(49-67) 156/64 (08/21 0524) SpO2:  [92 %-99 %] 98 % (08/21 0524)   Intake/Output from previous day: 08/20 0701 - 08/21 0700 In: 3514.6 [P.O.:717; I.V.:1486.5; IV Piggyback:1311] Out: 10 [Blood:10] Intake/Output this shift: No intake/output data recorded.   General appearance: alert and cooperative GI: soft, appropriately tender  Incision: no significant drainage  Lab Results:  Recent Labs    09/12/20 0218 09/13/20 0110  WBC 21.3* 20.5*  HGB 11.2* 11.2*  HCT 31.8* 31.3*  PLT 239 258   BMET Recent Labs    09/13/20 0110 09/14/20 0131  NA 122* 127*  K 4.0 4.4  CL 91* 97*  CO2 23 24  GLUCOSE 255* 410*  BUN 8 11  CREATININE 0.80 0.88  CALCIUM 8.0* 7.9*   PT/INR No results for input(s): LABPROT, INR in the last 72 hours. ABG No results for input(s): PHART, HCO3 in the last 72 hours.  Invalid input(s): PCO2, PO2  MEDS, Scheduled  aspirin EC  81 mg Oral Daily   atorvastatin  80 mg Oral QHS   cholecalciferol  2,000 Units Oral Daily   cycloSPORINE  1 drop Both Eyes BID   enoxaparin (LOVENOX) injection  40 mg Subcutaneous Q24H   insulin aspart  0-15 Units Subcutaneous TID WC   insulin aspart  0-5 Units Subcutaneous QHS   insulin glargine-yfgn  10 Units Subcutaneous Daily   lisinopril  5 mg Oral Daily   loratadine  10 mg Oral Daily   metoprolol tartrate  50 mg Oral BID   pantoprazole (PROTONIX) IV  40 mg Intravenous Q12H   thiamine  100 mg Oral Daily    Studies/Results: No results found.  Assessment: s/p Procedure(s): LAPAROSCOPIC CHOLECYSTECTOMY Patient Active Problem List   Diagnosis Date Noted   Acute cholecystitis 09/10/2020   Abdominal pain 09/09/2020   Gallstones without  obstruction of gallbladder 09/09/2020   Carotid artery stenosis    PAD (peripheral artery disease) (HCC)    HLD (hyperlipidemia)    Syncope 12/2018   CAD (coronary artery disease) 06/28/2018   Family history of early CAD 06/28/2018   NSTEMI (non-ST elevated myocardial infarction) (HCC) 06/16/2018   Peripheral arterial disease (HCC) 06/25/2016   Aortic atherosclerosis (HCC)    Coronary artery calcification seen on CAT scan 05/17/2016   Claudication (HCC) 05/17/2016   Heart murmur 05/17/2016   Chest pain 12/25/2012   Essential hypertension    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   Polyneuropathy in diabetes(357.2) 12/22/2012   Plantar fasciitis, bilateral 12/22/2012   Mixed hyperlipidemia 12/22/2012   Osteoporosis, unspecified 12/22/2012   Vitamin D deficiency 12/22/2012   Restless legs syndrome (RLS) 12/22/2012   Anxiety 12/22/2012   Sleep disturbance 12/22/2012   Tobacco use disorder 12/22/2012   Alcohol dependence (HCC) 12/22/2012    Expected post op course  Plan: Advance diet Ambulate  PO pain meds Ok for d/c later today if she tolerates a diet and pain controlled with PO meds Restart Brilinta on 8/22 No further abx needed   LOS: 4 days     .Vanita Panda, MD [facility], [place]    09/14/2020 7:44 AM

## 2020-09-14 NOTE — Discharge Summary (Signed)
Discharge Summary  Sarah Francis ZOX:[id number] DOB: [birth year]  PCP: Gweneth Dimitri, MD  Admit date: 09/09/2020 Discharge date: 09/14/2020  Time spent: 30 minutes  Recommendations for Outpatient Follow-up:  Primary care provider  Discharge Diagnoses:  Active Hospital Problems   Diagnosis Date Noted   Gallstones without obstruction of gallbladder 09/09/2020   Acute cholecystitis 09/10/2020   Abdominal pain 09/09/2020   Carotid artery stenosis    HLD (hyperlipidemia)    PAD (peripheral artery disease) (HCC)    CAD (coronary artery disease) 06/28/2018   Peripheral arterial disease (HCC) 06/25/2016   Essential hypertension    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    Discharge Condition: Improved  Diet recommendation: Regular  Vitals:   09/14/20 0924 09/14/20 1208  BP: (!) 147/61 134/60  Pulse: 79 78  Resp: 17 18  Temp:  97.6 F (36.4 C)  SpO2: 96% 95%    History of present illness:  75 year old female with history of [place] artery disease on aspirin and Brilinta, peripheral artery disease, hypertension, hyperlipidemia, GERD, type 2 diabetes mellitus, she was sent from her primary care provider office due to abdominal pain back pain and nausea vomiting, she initially presented to the ED emergency department on September 07, 2020 had abdominal CT which showed 2.2 cm gallstone in the gallbladder neck and wall thickening without. Cholecystitis and was allowed to be discharged home but returned with worsening symptoms and was admitted  Hospital Course:  Principal Problem:   Gallstones without obstruction of gallbladder Active Problems:   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HCC)   Mixed hyperlipidemia   Vitamin D deficiency   Restless legs syndrome (RLS)   Essential hypertension    Peripheral arterial disease (HCC)   CAD (coronary artery disease)   Carotid artery stenosis   PAD (peripheral artery disease) (HCC)   HLD (hyperlipidemia)   Abdominal pain   Acute cholecystitis #1 acute cholecystitis with cholelithiasis patient had a positive HIDA scan. She went to the OR September 13, 2020 for lap chole patient is doing well.  disContinue Cipro patient has started feeding as she is tolerating feeds well  2.  Coronary disease SP stents with PCI DES May 2020 patient is on Brilinta and aspirin and beta-blocker but it we will hold Brilinta for surgery  3.  Bilateral carotid artery carotid artery disease  4.  Peripheral vascular disease on aspirin and statin Hyperlipidemia on statin Hypertension uncontrolled continue lisinopril metoprolol  5.  Type 2 diabetes mellitus on Lantus 30 units twice daily poorly controlled  6.  Leukocytosis secondary to cholecystitis continue IV Cipro and monitor  Procedures: Lap chole  Consultations: General surgery  Discharge Exam: BP 134/60 (BP Location: Left Arm)   Pulse 78   Temp 97.6 F (36.4 C) (Oral)   Resp 18   Ht  (1.575 m)   Wt 70.3 kg   SpO2 95%   BMI 28.35 kg/m   General: Obese alert oriented x3 no distress Cardiovascular: Regular rate and rhythm Respiratory: Clear to auscultation  Discharge Instructions You were cared for by a hospitalist during your hospital stay. If you have any questions about your discharge medications or the care you received while you were in the hospital after you are discharged, you can call the unit and asked to speak with the hospitalist on call if the hospitalist  that took care of you is not available. Once you are discharged, your primary care physician will handle any further medical issues. Please note that NO REFILLS for any discharge medications will be authorized once you are discharged, as it is imperative that you return to your primary care physician (or establish a relationship  with a primary care physician if you do not have one) for your aftercare needs so that they can reassess your need for medications and monitor your lab values.  Discharge Instructions     Call MD for:  persistant nausea and vomiting   Complete by: As directed    Call MD for:  severe uncontrolled pain   Complete by: As directed    Call MD for:  temperature >100.4   Complete by: As directed    Diet - low sodium heart healthy   Complete by: As directed    Discharge instructions   Complete by: As directed    Restart Brilinta on 8/22 F/U PCP 1-2 WEEKS   Increase activity slowly   Complete by: As directed    No dressing needed   Complete by: As directed       Allergies as of 09/14/2020       Reactions   Penicillins Rash   Did it involve swelling of the face/tongue/throat, SOB, or low BP? Yes Did it involve sudden or severe rash/hives, skin peeling, or any reaction on the inside of your mouth or nose? Unknown Did you need to seek medical attention at a hospital or doctor's office? Unknown When did it last happen?    Pt was a child, had lip swelling   If all above answers are "NO", may proceed with cephalosporin use.        Medication List     TAKE these medications    acetaminophen 325 MG tablet Commonly known as: TYLENOL Take 2 tablets (650 mg total) by mouth every 6 (six) hours as needed for mild pain (or Fever >/= 101).   aspirin EC 81 MG tablet Take 81 mg by mouth daily.   atorvastatin 80 MG tablet Commonly known as: LIPITOR TAKE 1 TABLET BY MOUTH EVERY DAY AT 6PM What changed: See the new instructions.   cetirizine 10 MG tablet Commonly known as: ZYRTEC Take 10 mg by mouth every morning.   cholecalciferol 25 MCG (1000 UNIT) tablet Commonly known as: VITAMIN D3 Take 2,000 Units by mouth daily.   cycloSPORINE 0.05 % ophthalmic emulsion Commonly known as: RESTASIS Place 1 drop into both eyes 2 (two) times daily.   DULoxetine 60 MG capsule Commonly known as:  CYMBALTA Take 60 mg by mouth daily.   HumaLOG KwikPen 100 UNIT/ML KwikPen Generic drug: insulin lispro Inject 12 Units into the skin in the morning and at bedtime.   Lantus SoloStar 100 UNIT/ML Solostar Pen Generic drug: insulin glargine Inject 30 Units into the skin 2 (two) times daily.   lisinopril 5 MG tablet Commonly known as: ZESTRIL Take 5 mg by mouth daily. What changed: Another medication with the same name was removed. Continue taking this medication, and follow the directions you see here.   metoprolol tartrate 50 MG tablet Commonly known as: LOPRESSOR TAKE 1 TABLET BY MOUTH TWICE A DAY   nicotine 21 mg/24hr patch Commonly known as: Nicoderm CQ Place 1 patch (21 mg total) onto the skin daily.   nitroGLYCERIN 0.4 MG SL tablet Commonly known as: NITROSTAT PLACE 1 TABLET UNDER THE TONGUE EVERY FOR 3 DOSES AS NEEDED  FOR CHEST PAIN   omeprazole 20 MG capsule Commonly known as: PRILOSEC Take 20 mg by mouth daily.   ondansetron 4 MG tablet Commonly known as: ZOFRAN Take 1 tablet (4 mg total) by mouth every 6 (six) hours as needed for nausea.   oxyCODONE 5 MG immediate release tablet Commonly known as: Oxy IR/ROXICODONE Take 1 tablet (5 mg total) by mouth every 6 (six) hours as needed for moderate pain.   thiamine 100 MG tablet Commonly known as: Vitamin B-1 Take 100 mg by mouth daily.   ticagrelor 60 MG Tabs tablet Commonly known as: Brilinta Take 1 tablet (60 mg total) by mouth 2 (two) times daily.   traZODone 50 MG tablet Commonly known as: DESYREL Take 100-150 mg by mouth at bedtime.   vitamin B-12 1000 MCG tablet Commonly known as: CYANOCOBALAMIN Take 1,000 mcg by mouth daily.               Discharge Care Instructions  (From admission, onward)           Start     Ordered   09/14/20 0000  No dressing needed        09/14/20 1433           Allergies  Allergen Reactions   Penicillins Rash    Did it involve swelling of the  face/tongue/throat, SOB, or low BP? Yes Did it involve sudden or severe rash/hives, skin peeling, or any reaction on the inside of your mouth or nose? Unknown Did you need to seek medical attention at a hospital or doctor's office? Unknown When did it last happen?    Pt was a child, had lip swelling   If all above answers are "NO", may proceed with cephalosporin use.       The results of significant diagnostics from this hospitalization (including imaging, microbiology, ancillary and laboratory) are listed below for reference.    Significant Diagnostic Studies: DG Chest 2 View  Result Date: 09/09/2020 CLINICAL DATA:  Chest pain. EXAM: CHEST - 2 VIEW COMPARISON:  September 07, 2020. FINDINGS: The heart size and mediastinal contours are within normal limits. Both lungs are clear. The visualized skeletal structures are unremarkable. IMPRESSION: No active cardiopulmonary disease. Aortic Atherosclerosis (ICD10-I70.0). Electronically Signed   By: Lupita Raider M.D.   On: 09/09/2020 13:20   NM Hepatobiliary Liver Func  Result Date: 09/10/2020 CLINICAL DATA:  Rule out cholecystitis. EXAM: NUCLEAR MEDICINE HEPATOBILIARY IMAGING TECHNIQUE: Sequential images of the abdomen were obtained out to 60 minutes following intravenous administration of radiopharmaceutical. RADIOPHARMACEUTICALS:  4.85 mCi Tc-38m  Choletec IV COMPARISON:  Abdominal sonogram 05/10/2020. FINDINGS: Prompt uptake and biliary excretion of activity by the liver is seen. Biliary activity passes into small bowel, consistent with patent common bile duct. After 60 minutes of imaging no gallbladder activity was visualized. Patient then received 3 mg of morphine IV, and imaging was carried out for an additional 30 minutes. After 30 minutes no gallbladder activity was visualized. IMPRESSION: Nonvisualization of the gallbladder compatible with cystic duct obstruction due to acute cholecystitis. These results will be called to the ordering clinician  or representative by the Radiologist Assistant, and communication documented in the PACS or [employer]. Electronically Signed   By: Signa Kell M.D.   On: 09/10/2020 13:51   DG Chest Port 1 View  Result Date: 09/07/2020 CLINICAL DATA:  75 year old female with chest pain. EXAM: PORTABLE CHEST 1 VIEW COMPARISON:  Chest CT 03/23/2019 and earlier. FINDINGS: Portable AP upright view at  0505 hours. Calcified aortic atherosclerosis. Lung volumes and mediastinal contours are within normal limits when allowing for portable technique. Visualized tracheal air column is within normal limits. Allowing for portable technique the lungs are clear. No pneumothorax or pleural effusion. No acute osseous abnormality identified. IMPRESSION: No acute cardiopulmonary abnormality. Aortic Atherosclerosis (ICD10-I70.0). Electronically Signed   By: Odessa Fleming M.D.   On: 09/07/2020 05:36   ECHOCARDIOGRAM COMPLETE  Result Date: 09/11/2020    ECHOCARDIOGRAM REPORT   Patient Name:   Sarah Francis Date of Exam: 09/10/2020 Medical Rec #:  [id number]    Height:       62.0 in Accession #:    [id number]   Weight:       155.0 lb Date of Birth:  20-Aug-1945    BSA:          1.715 m Patient Age:    75 years     BP:           134/64 mmHg Patient Gender: F            HR:           113 bpm. Exam Location:  Inpatient Procedure: 2D Echo, Color Doppler, Cardiac Doppler and Intracardiac            Opacification Agent                                MODIFIED REPORT:   This report was modified by Chilton Si MD on 09/11/2020 due to apical                                    akinesis.  Indications:     R07.9* Chest pain, unspecified  History:         Patient has prior history of Echocardiogram examinations, most                  recent 12/01/2018. CAD, PAD; Risk Factors:Hypertension, Diabetes                  and Dyslipidemia.  Sonographer:     Irving Burton Senior RDCS Referring Phys:  [id number] Alessandra Bevels Diagnosing Phys: Chilton Si MD IMPRESSIONS   1. Intracavitary gradient. Peak velocity 3.32 m/s. Peak gradient 44.2 mmHg. Apical akinesis. Left ventricular ejection fraction, by estimation, is 70 to 75%. The left ventricle has hyperdynamic function. The left ventricle demonstrates regional wall motion abnormalities (see scoring diagram/findings for description). There is moderate concentric left ventricular hypertrophy. Left ventricular diastolic parameters are consistent with Grade I diastolic dysfunction (impaired relaxation). Elevated left ventricular end-diastolic pressure.  2. Right ventricular systolic function is normal. The right ventricular size is normal. There is normal pulmonary artery systolic pressure.  3. The mitral valve is normal in structure. Trivial mitral valve regurgitation. No evidence of mitral stenosis.  4. The aortic valve is tricuspid. Aortic valve regurgitation is not visualized. No aortic stenosis is present.  5. The inferior vena cava is normal in size with greater than 50% respiratory variability, suggesting right atrial pressure of 3 mmHg. FINDINGS  Left Ventricle: Intracavitary gradient. Peak velocity 3.32 m/s. Peak gradient 44.2 mmHg. Apical akinesis. Left ventricular ejection fraction, by estimation, is 70 to 75%. The left ventricle has hyperdynamic function. The left ventricle demonstrates regional wall motion abnormalities. Definity contrast agent was given IV to delineate the left ventricular endocardial  borders. The left ventricular internal cavity size was normal in size. There is moderate concentric left ventricular hypertrophy. Left ventricular diastolic parameters are consistent with Grade I diastolic dysfunction (impaired relaxation). Elevated left ventricular end-diastolic pressure. Right Ventricle: The right ventricular size is normal. No increase in right ventricular wall thickness. Right ventricular systolic function is normal. There is normal pulmonary artery systolic pressure. The tricuspid regurgitant velocity  is 2.59 m/s, and  with an assumed right atrial pressure of 3 mmHg, the estimated right ventricular systolic pressure is 29.8 mmHg. Left Atrium: Left atrial size was normal in size. Right Atrium: Right atrial size was normal in size. Pericardium: There is no evidence of pericardial effusion. Mitral Valve: The mitral valve is normal in structure. Trivial mitral valve regurgitation. No evidence of mitral valve stenosis. Tricuspid Valve: The tricuspid valve is normal in structure. Tricuspid valve regurgitation is trivial. No evidence of tricuspid stenosis. Aortic Valve: The aortic valve is tricuspid. Aortic valve regurgitation is not visualized. No aortic stenosis is present. Pulmonic Valve: The pulmonic valve was normal in structure. Pulmonic valve regurgitation is not visualized. No evidence of pulmonic stenosis. Aorta: The aortic root is normal in size and structure. Venous: The inferior vena cava is normal in size with greater than 50% respiratory variability, suggesting right atrial pressure of 3 mmHg. IAS/Shunts: No atrial level shunt detected by color flow Doppler.  LEFT VENTRICLE PLAX 2D LVIDd:         3.10 cm  Diastology LVIDs:         2.00 cm  LV e' medial:    4.57 cm/s LV PW:         1.40 cm  LV E/e' medial:  15.4 LV IVS:        1.40 cm  LV e' lateral:   9.25 cm/s LVOT diam:     1.90 cm  LV E/e' lateral: 7.6 LV SV:         90 LV SV Index:   52 LVOT Area:     2.84 cm  RIGHT VENTRICLE RV S prime:     12.80 cm/s TAPSE (M-mode): 2.0 cm LEFT ATRIUM             Index       RIGHT ATRIUM           Index LA diam:        3.60 cm 2.10 cm/m  RA Area:     11.80 cm LA Vol (A2C):   48.2 ml 28.10 ml/m RA Volume:   23.60 ml  13.76 ml/m LA Vol (A4C):   39.0 ml 22.73 ml/m LA Biplane Vol: 44.5 ml 25.94 ml/m  AORTIC VALVE LVOT Vmax:   156.00 cm/s LVOT Vmean:  120.000 cm/s LVOT VTI:    0.317 m  AORTA Ao Root diam: 2.60 cm Ao Asc diam:  2.70 cm MITRAL VALVE                TRICUSPID VALVE MV Area (PHT): 2.99 cm     TR Peak  grad:   26.8 mmHg MV Decel Time: 254 msec     TR Vmax:        258.95 cm/s MV E velocity: 70.60 cm/s MV A velocity: 107.00 cm/s  SHUNTS MV E/A ratio:  0.66         Systemic VTI:  0.32 m  Systemic Diam: 1.90 cm Chilton Si MD Electronically signed by Chilton Si MD Signature Date/Time: 09/10/2020/4:39:43 PM    Final (Updated)    CT Angio Chest/Abd/Pel for Dissection W and/or Wo Contrast  Result Date: 09/07/2020 CLINICAL DATA:  75 year old female with abdominal pain and chest pain. EXAM: CT ANGIOGRAPHY CHEST, ABDOMEN AND PELVIS TECHNIQUE: Portable chest 0505 hours today. Multidetector CT imaging through the chest, abdomen and pelvis was performed using the standard protocol during bolus administration of intravenous contrast. Multiplanar reconstructed images and MIPs were obtained and reviewed to evaluate the vascular anatomy. CONTRAST:  75mL OMNIPAQUE IOHEXOL 350 MG/ML SOLN COMPARISON:  Noncontrast chest CT 03/23/2019. FINDINGS: CTA CHEST FINDINGS Cardiovascular: Extensive thoracic aortic calcified atherosclerosis. Similar coronary artery calcified plaque. No mural hematoma. No thoracic aortic aneurysm or dissection. Proximal great vessel atherosclerosis. High-grade stenosis or occlusion of the right ICA origin is visible on series 6, image 1. Bulky atherosclerosis there on a 2017 cervical spine CT. Cardiac size within normal limits. No pericardial effusion. Central pulmonary arteries also well opacified and appear to be patent. Mediastinum/Nodes: Negative. Lungs/Pleura: Stable lung volumes. Major airways are patent. No pleural effusion. Lungs appear stable since last year, with scattered areas of peribronchial postinflammatory appearing scarring, more macroscopic scarring in the lingula. Mild dependent atelectasis. Musculoskeletal: Chronic thoracic disc and endplate degeneration. No acute osseous abnormality identified. Review of the MIP images confirms the above findings. CTA  ABDOMEN AND PELVIS FINDINGS VASCULAR Extensive Aortoiliac calcified atherosclerosis. Negative for abdominal aortic aneurysm or dissection. Major arterial structures in the abdomen and pelvis remain patent. Abundant bilateral femoral artery atherosclerosis, visible proximal femoral arteries remain patent. Review of the MIP images confirms the above findings. NON-VASCULAR Hepatobiliary: 2.2 cm gallstone in the neck of the gallbladder with mildly thickened and indistinct appearance of some of the gallbladder wall (coronal image 53). But no convincing pericholecystic inflammation. Hepatic steatosis. Pancreas: Negative. Spleen: Negative. Adrenals/Urinary Tract: Distended urinary bladder (400 mL). Normal adrenal glands. Kidneys appears symmetric and normal. No hydronephrosis or hydroureter. Stomach/Bowel: Small gastric hiatal hernia is chronic. Diverticulosis of the descending and sigmoid colon without active inflammation. Otherwise negative. Normal appendix. No dilated bowel. Lymphatic: No lymphadenopathy. Reproductive: Absent uterus.  Ovaries are within normal limits. Other: No pelvic free fluid. Musculoskeletal: Lower lumbar facet arthropathy. No acute osseous abnormality identified. Review of the MIP images confirms the above findings. IMPRESSION: 1. Great vessel atherosclerosis including bulky plaque at the right carotid bifurcation and possible Right ICA Occlusion. In the absence of acute neurologic symptoms a follow-up Carotid Doppler Ultrasound may be the simplest way to evaluate further. Otherwise CTA neck with IV contrast would further characterize. 2. Extensive Aortic Atherosclerosis (ICD10-I70.0). Negative for aneurysm or dissection. 3. Gallbladder wall thickening and a 2.2 cm gallstone in the neck of the gallbladder. But no convincing pericholecystic inflammation. If there is right abdominal pain then follow-up Ultrasound would be valuable. 4. Distended urinary bladder (400 mL). Stable lung scarring. Hepatic  steatosis. Small gastric hiatal hernia. Diverticulosis of the descending and sigmoid colon without active inflammation. Electronically Signed   By: Odessa Fleming M.D.   On: 09/07/2020 06:14   US Abdomen Limited RUQ (LIVER/GB)  Result Date: 09/09/2020 CLINICAL DATA:  Chest pain abdominal pain since Sunday EXAM: ULTRASOUND ABDOMEN LIMITED RIGHT UPPER QUADRANT COMPARISON:  Ultrasound 09/07/2020 CT 09/07/2020 FINDINGS: Gallbladder: Gallbladder is nondistended. Several echogenic gallstones are present the lumen of the gallbladder including a 19 mm stone towards the neck of the gallbladder. No pericholecystic fluid. No gallbladder wall thickening. Negative  sonographic Murphy's sign. Common bile duct: Diameter: Normal at 3 mm Liver: Liver has uniform increase in echogenicity. There are several hypoechoic regions along the gallbladder fossa most suggestive of focal fatty sparing. Portal vein is patent on color Doppler imaging with normal direction of blood flow towards the liver. Other: Ascites IMPRESSION: 1. No interval change from ultrasound 09/07/2020. 2. Cholelithiasis without acute cholecystitis. 3. Increased liver echogenicity suggests hepatic steatosis. No significant steatosis noted on comparison CT 09/07/2020. 4. Hypoechoic regions along the gallbladder fossa most suggestive focal fatty sparing. 5. With elevated bilirubin and elevated white blood cell count, consider contrast MRI for further evaluation. Electronically Signed   By: Genevive Bi M.D.   On: 09/09/2020 15:02   US Abdomen Limited RUQ (LIVER/GB)  Result Date: 09/07/2020 CLINICAL DATA:  75 year old female with right upper quadrant pain. CTA Chest, Abdomen, and Pelvis today demonstrating gallstone and gallbladder wall thickening. EXAM: ULTRASOUND ABDOMEN LIMITED RIGHT UPPER QUADRANT COMPARISON:  CTA 0548 hours today. FINDINGS: Gallbladder: Mild gallbladder wall irregularity, but overall wall thickness at the upper limits of normal-3 mm (image 2).  Small gallstone in the fundus is shadowing on image 8. The larger gallstone in the gallbladder neck not as easily identified (on image 11). No pericholecystic fluid. No sonographic Murphy sign elicited. Common bile duct: Diameter: 5 mm, normal. Liver: Echogenic liver (image 32). No discrete liver lesion. No intrahepatic biliary ductal dilatation. Portal vein is patent on color Doppler imaging with normal direction of blood flow towards the liver. Other: Negative visible right kidney. IMPRESSION: 1. Gallstones, including the 2.2 cm gallstone in the neck of the gallbladder, but no strong evidence of acute cholecystitis at this time. No evidence of bile duct obstruction. 2. Fatty liver disease. Electronically Signed   By: Odessa Fleming M.D.   On: 09/07/2020 07:38    Microbiology: Recent Results (from the past 240 hour(s))  Resp Panel by RT-PCR (Flu A&B, Covid) Nasopharyngeal Swab     Status: None   Collection Time: 09/07/20  4:55 AM   Specimen: Nasopharyngeal Swab; Nasopharyngeal(NP) swabs in vial transport medium  Result Value Ref Range Status   SARS Coronavirus 2 by RT PCR NEGATIVE NEGATIVE Final    Comment: (NOTE) SARS-CoV-2 target nucleic acids are NOT DETECTED.  The SARS-CoV-2 RNA is generally detectable in upper respiratory specimens during the acute phase of infection. The lowest concentration of SARS-CoV-2 viral copies this assay can detect is 138 copies/mL. A negative result does not preclude SARS-Cov-2 infection and should not be used as the sole basis for treatment or other patient management decisions. A negative result may occur with  improper specimen collection/handling, submission of specimen other than nasopharyngeal swab, presence of viral mutation(s) within the areas targeted by this assay, and inadequate number of viral copies(<138 copies/mL). A negative result must be combined with clinical observations, patient history, and epidemiological information. The expected result is  Negative.  Fact Sheet for Patients:  BloggerCourse.com  Fact Sheet for Healthcare Providers:  SeriousBroker.it  This test is no t yet approved or cleared by the [place] FDA and  has been authorized for detection and/or diagnosis of SARS-CoV-2 by FDA under an Emergency Use Authorization (EUA). This EUA will remain  in effect (meaning this test can be used) for the duration of the COVID-19 declaration under Section 564(b)(1) of the Act, 21 U.S.C.section 360bbb-3(b)(1), unless the authorization is terminated  or revoked sooner.       Influenza A by PCR NEGATIVE NEGATIVE Final   Influenza B  by PCR NEGATIVE NEGATIVE Final    Comment: (NOTE) The Xpert Xpress SARS-CoV-2/FLU/RSV plus assay is intended as an aid in the diagnosis of influenza from Nasopharyngeal swab specimens and should not be used as a sole basis for treatment. Nasal washings and aspirates are unacceptable for Xpert Xpress SARS-CoV-2/FLU/RSV testing.  Fact Sheet for Patients: BloggerCourse.comhttps://www.fda.gov/media/152166/download  Fact Sheet for Healthcare Providers: SeriousBroker.ithttps://www.fda.gov/media/152162/download  This test is not yet approved or cleared by the [place] FDA and has been authorized for detection and/or diagnosis of SARS-CoV-2 by FDA under an Emergency Use Authorization (EUA). This EUA will remain in effect (meaning this test can be used) for the duration of the COVID-19 declaration under Section 564(b)(1) of the Act, 21 U.S.C. section 360bbb-3(b)(1), unless the authorization is terminated or revoked.  Performed at [facility], [address] [place], [place] [address]   Urine Culture     Status: Abnormal   Collection Time: 09/09/20  2:37 PM   Specimen: Urine, Clean Catch  Result Value Ref Range Status   Specimen Description URINE, CLEAN CATCH  Final   Special Requests   Final    NONE Performed at [facility], [address]  [place], [place] [address]    Culture MULTIPLE SPECIES PRESENT, SUGGEST RECOLLECTION (A)  Final   Report Status 09/10/2020 FINAL  Final  Resp Panel by RT-PCR (Flu A&B, Covid) Nasopharyngeal Swab     Status: None   Collection Time: 09/09/20  3:38 PM   Specimen: Nasopharyngeal Swab; Nasopharyngeal(NP) swabs in vial transport medium  Result Value Ref Range Status   SARS Coronavirus 2 by RT PCR NEGATIVE NEGATIVE Final    Comment: (NOTE) SARS-CoV-2 target nucleic acids are NOT DETECTED.  The SARS-CoV-2 RNA is generally detectable in upper respiratory specimens during the acute phase of infection. The lowest concentration of SARS-CoV-2 viral copies this assay can detect is 138 copies/mL. A negative result does not preclude SARS-Cov-2 infection and should not be used as the sole basis for treatment or other patient management decisions. A negative result may occur with  improper specimen collection/handling, submission of specimen other than nasopharyngeal swab, presence of viral mutation(s) within the areas targeted by this assay, and inadequate number of viral copies(<138 copies/mL). A negative result must be combined with clinical observations, patient history, and epidemiological information. The expected result is Negative.  Fact Sheet for Patients:  BloggerCourse.comhttps://www.fda.gov/media/152166/download  Fact Sheet for Healthcare Providers:  SeriousBroker.ithttps://www.fda.gov/media/152162/download  This test is no t yet approved or cleared by the [place] FDA and  has been authorized for detection and/or diagnosis of SARS-CoV-2 by FDA under an Emergency Use Authorization (EUA). This EUA will remain  in effect (meaning this test can be used) for the duration of the COVID-19 declaration under Section 564(b)(1) of the Act, 21 U.S.C.section 360bbb-3(b)(1), unless the authorization is terminated  or revoked sooner.       Influenza A by PCR NEGATIVE NEGATIVE Final   Influenza B by PCR NEGATIVE NEGATIVE Final     Comment: (NOTE) The Xpert Xpress SARS-CoV-2/FLU/RSV plus assay is intended as an aid in the diagnosis of influenza from Nasopharyngeal swab specimens and should not be used as a sole basis for treatment. Nasal washings and aspirates are unacceptable for Xpert Xpress SARS-CoV-2/FLU/RSV testing.  Fact Sheet for Patients: BloggerCourse.comhttps://www.fda.gov/media/152166/download  Fact Sheet for Healthcare Providers: SeriousBroker.ithttps://www.fda.gov/media/152162/download  This test is not yet approved or cleared by the [place] FDA and has been authorized for detection and/or diagnosis of SARS-CoV-2 by FDA  under an Emergency Use Authorization (EUA). This EUA will remain in effect (meaning this test can be used) for the duration of the COVID-19 declaration under Section 564(b)(1) of the Act, 21 U.S.C. section 360bbb-3(b)(1), unless the authorization is terminated or revoked.  Performed at [facility], [address] [place], [place] [address]   Culture, blood (routine x 2)     Status: None   Collection Time: 09/09/20  8:33 PM   Specimen: BLOOD RIGHT HAND  Result Value Ref Range Status   Specimen Description BLOOD RIGHT HAND  Final   Special Requests   Final    BOTTLES DRAWN AEROBIC AND ANAEROBIC Blood Culture results may not be optimal due to an inadequate volume of blood received in culture bottles   Culture   Final    NO GROWTH 5 DAYS Performed at [facility], [address] [place], [place] [address]    Report Status 09/14/2020 FINAL  Final  Culture, blood (routine x 2)     Status: None (Preliminary result)   Collection Time: 09/10/20  1:20 AM   Specimen: BLOOD RIGHT ARM  Result Value Ref Range Status   Specimen Description BLOOD RIGHT ARM  Final   Special Requests   Final    BOTTLES DRAWN AEROBIC AND ANAEROBIC Blood Culture adequate volume   Culture   Final    NO GROWTH 4 DAYS Performed at [facility], [address], [place], [place] [address]    Report Status  PENDING  Incomplete  Surgical pcr screen     Status: None   Collection Time: 09/11/20 12:20 AM   Specimen: Nasal Mucosa; Nasal Swab  Result Value Ref Range Status   MRSA, PCR NEGATIVE NEGATIVE Final   Staphylococcus aureus NEGATIVE NEGATIVE Final    Comment: (NOTE) The Xpert SA Assay (FDA approved for NASAL specimens in patients 28 years of age and older), is one component of a comprehensive surveillance program. It is not intended to diagnose infection nor to guide or monitor treatment. Performed at [facility], [address], [place], [place] [address]      Labs: Basic Metabolic Panel: Recent Labs  Lab 09/10/20 0126 09/11/20 0351 09/12/20 0218 09/13/20 0110 09/14/20 0131  NA 124* 125* 125* 122* 127*  K 3.6 3.7 3.2* 4.0 4.4  CL 91* 88* 91* 91* 97*  CO2 20* 24 24 23 24   GLUCOSE 192* 128* 208* 255* 410*  BUN 8 7* 8 8 11   CREATININE 0.72 0.71 0.77 0.80 0.88  CALCIUM 8.2* 8.5* 8.3* 8.0* 7.9*   Liver Function Tests: Recent Labs  Lab 09/09/20 1210 09/10/20 0126 09/11/20 0351 09/12/20 0218 09/14/20 0131  AST 17 25 23 22 29   ALT 26 26 25 21 21   ALKPHOS 75 93 85 102 105  BILITOT 1.4* 1.2 0.9 0.6 0.4  PROT 7.2 6.3* 5.9* 5.5* 4.9*  ALBUMIN 3.4* 3.0* 2.6* 2.3* 2.0*   Recent Labs  Lab 09/09/20 1210  LIPASE 27   No results for input(s): AMMONIA in the last 168 hours. CBC: Recent Labs  Lab 09/09/20 1210 09/09/20 2032 09/10/20 0126 09/11/20 0351 09/12/20 0218 09/13/20 0110  WBC 27.1* 26.4* 27.8* 24.2* 21.3* 20.5*  NEUTROABS 25.2*  --   --   --   --   --   HGB 14.1 13.1 12.8 12.7 11.2* 11.2*  HCT 40.2 37.3 37.0 35.3* 31.8* 31.3*  MCV 94.1 94.7 94.4 92.2 93.0 92.6  PLT 248 220 224 230 239  258   Cardiac Enzymes: No results for input(s): CKTOTAL, CKMB, CKMBINDEX, TROPONINI in the last 168 hours. BNP: BNP (last 3 results) No results for input(s): BNP in the last 8760 hours.  ProBNP (last 3 results) No results for input(s): PROBNP in the last 8760  hours.  CBG: Recent Labs  Lab 09/13/20 2106 09/14/20 0529 09/14/20 0758 09/14/20 1209 09/14/20 1410  GLUCAP 434* 410* 393* 403* 279*       Signed:  Myrtie Neither, MD [facility] 09/14/2020, 2:34 PM

## 2020-09-14 NOTE — Progress Notes (Signed)
   09/13/20 2112  Provider Notification  Provider Name/Title Bruna Potter, NP  Date Provider Notified 09/13/20  Time Provider Notified 2112  Notification Type Page  Notification Reason Other (Comment) (CBG 434)  Provider response See new orders  Date of Provider Response 09/13/20  Time of Provider Response 2115

## 2020-09-15 LAB — CULTURE, BLOOD (ROUTINE X 2)
Culture: NO GROWTH
Special Requests: ADEQUATE

## 2020-09-16 LAB — SURGICAL PATHOLOGY

## 2020-09-17 LAB — PLATELET INHIBITION P2Y12

## 2020-10-02 DIAGNOSIS — E1165 Type 2 diabetes mellitus with hyperglycemia: Secondary | ICD-10-CM | POA: Diagnosis not present

## 2020-10-02 DIAGNOSIS — E1142 Type 2 diabetes mellitus with diabetic polyneuropathy: Secondary | ICD-10-CM | POA: Diagnosis not present

## 2020-10-13 ENCOUNTER — Ambulatory Visit
Admission: RE | Admit: 2020-10-13 | Discharge: 2020-10-13 | Disposition: A | Discharge status: Home / Self Care | Payer: Medicare PPO | Source: Ambulatory Visit | Attending: Family Medicine | Admitting: Family Medicine

## 2020-10-13 ENCOUNTER — Other Ambulatory Visit: Payer: Self-pay

## 2020-10-13 DIAGNOSIS — M810 Age-related osteoporosis without current pathological fracture: Secondary | ICD-10-CM

## 2020-10-13 DIAGNOSIS — Z780 Asymptomatic menopausal state: Secondary | ICD-10-CM | POA: Diagnosis not present

## 2020-10-23 ENCOUNTER — Encounter (HOSPITAL_COMMUNITY): Payer: Self-pay | Admitting: Radiology

## 2020-10-29 ENCOUNTER — Other Ambulatory Visit: Payer: Self-pay

## 2020-10-29 ENCOUNTER — Ambulatory Visit (HOSPITAL_COMMUNITY)
Admission: RE | Admit: 2020-10-29 | Discharge: 2020-10-29 | Disposition: A | Discharge status: Home / Self Care | Payer: Medicare PPO | Source: Ambulatory Visit | Attending: Cardiovascular Disease | Admitting: Cardiovascular Disease

## 2020-10-29 DIAGNOSIS — I6523 Occlusion and stenosis of bilateral carotid arteries: Secondary | ICD-10-CM | POA: Insufficient documentation

## 2020-10-31 DIAGNOSIS — Z23 Encounter for immunization: Secondary | ICD-10-CM | POA: Diagnosis not present

## 2020-10-31 DIAGNOSIS — F419 Anxiety disorder, unspecified: Secondary | ICD-10-CM | POA: Diagnosis not present

## 2020-10-31 DIAGNOSIS — I739 Peripheral vascular disease, unspecified: Secondary | ICD-10-CM | POA: Diagnosis not present

## 2020-10-31 DIAGNOSIS — E782 Mixed hyperlipidemia: Secondary | ICD-10-CM | POA: Diagnosis not present

## 2020-10-31 DIAGNOSIS — I2584 Coronary atherosclerosis due to calcified coronary lesion: Secondary | ICD-10-CM | POA: Diagnosis not present

## 2020-10-31 DIAGNOSIS — M810 Age-related osteoporosis without current pathological fracture: Secondary | ICD-10-CM | POA: Diagnosis not present

## 2020-10-31 DIAGNOSIS — Z79899 Other long term (current) drug therapy: Secondary | ICD-10-CM | POA: Diagnosis not present

## 2020-10-31 DIAGNOSIS — I10 Essential (primary) hypertension: Secondary | ICD-10-CM | POA: Diagnosis not present

## 2020-10-31 DIAGNOSIS — E1165 Type 2 diabetes mellitus with hyperglycemia: Secondary | ICD-10-CM | POA: Diagnosis not present

## 2020-11-02 DIAGNOSIS — E1142 Type 2 diabetes mellitus with diabetic polyneuropathy: Secondary | ICD-10-CM | POA: Diagnosis not present

## 2020-11-02 DIAGNOSIS — E1165 Type 2 diabetes mellitus with hyperglycemia: Secondary | ICD-10-CM | POA: Diagnosis not present

## 2020-11-13 DIAGNOSIS — I10 Essential (primary) hypertension: Secondary | ICD-10-CM | POA: Diagnosis not present

## 2020-11-13 DIAGNOSIS — E1165 Type 2 diabetes mellitus with hyperglycemia: Secondary | ICD-10-CM | POA: Diagnosis not present

## 2020-11-13 DIAGNOSIS — E669 Obesity, unspecified: Secondary | ICD-10-CM | POA: Diagnosis not present

## 2020-11-13 DIAGNOSIS — E785 Hyperlipidemia, unspecified: Secondary | ICD-10-CM | POA: Diagnosis not present

## 2020-11-13 DIAGNOSIS — F419 Anxiety disorder, unspecified: Secondary | ICD-10-CM | POA: Diagnosis not present

## 2020-11-13 DIAGNOSIS — F17210 Nicotine dependence, cigarettes, uncomplicated: Secondary | ICD-10-CM | POA: Diagnosis not present

## 2020-11-13 DIAGNOSIS — G4700 Insomnia, unspecified: Secondary | ICD-10-CM | POA: Diagnosis not present

## 2020-11-13 DIAGNOSIS — F3341 Major depressive disorder, recurrent, in partial remission: Secondary | ICD-10-CM | POA: Diagnosis not present

## 2020-11-13 DIAGNOSIS — Z794 Long term (current) use of insulin: Secondary | ICD-10-CM | POA: Diagnosis not present

## 2020-12-04 DIAGNOSIS — E1142 Type 2 diabetes mellitus with diabetic polyneuropathy: Secondary | ICD-10-CM | POA: Diagnosis not present

## 2020-12-04 DIAGNOSIS — E1165 Type 2 diabetes mellitus with hyperglycemia: Secondary | ICD-10-CM | POA: Diagnosis not present

## 2020-12-05 ENCOUNTER — Other Ambulatory Visit: Payer: Self-pay | Admitting: Cardiology

## 2020-12-10 IMAGING — CR DG FOOT COMPLETE 3+V*L*
3 series · 3 of 3 positions shown · non-contrast
Comparison: None.

CLINICAL DATA: Pain and discoloration

EXAM:
LEFT FOOT - COMPLETE 3+ VIEW

[x foot ap left]
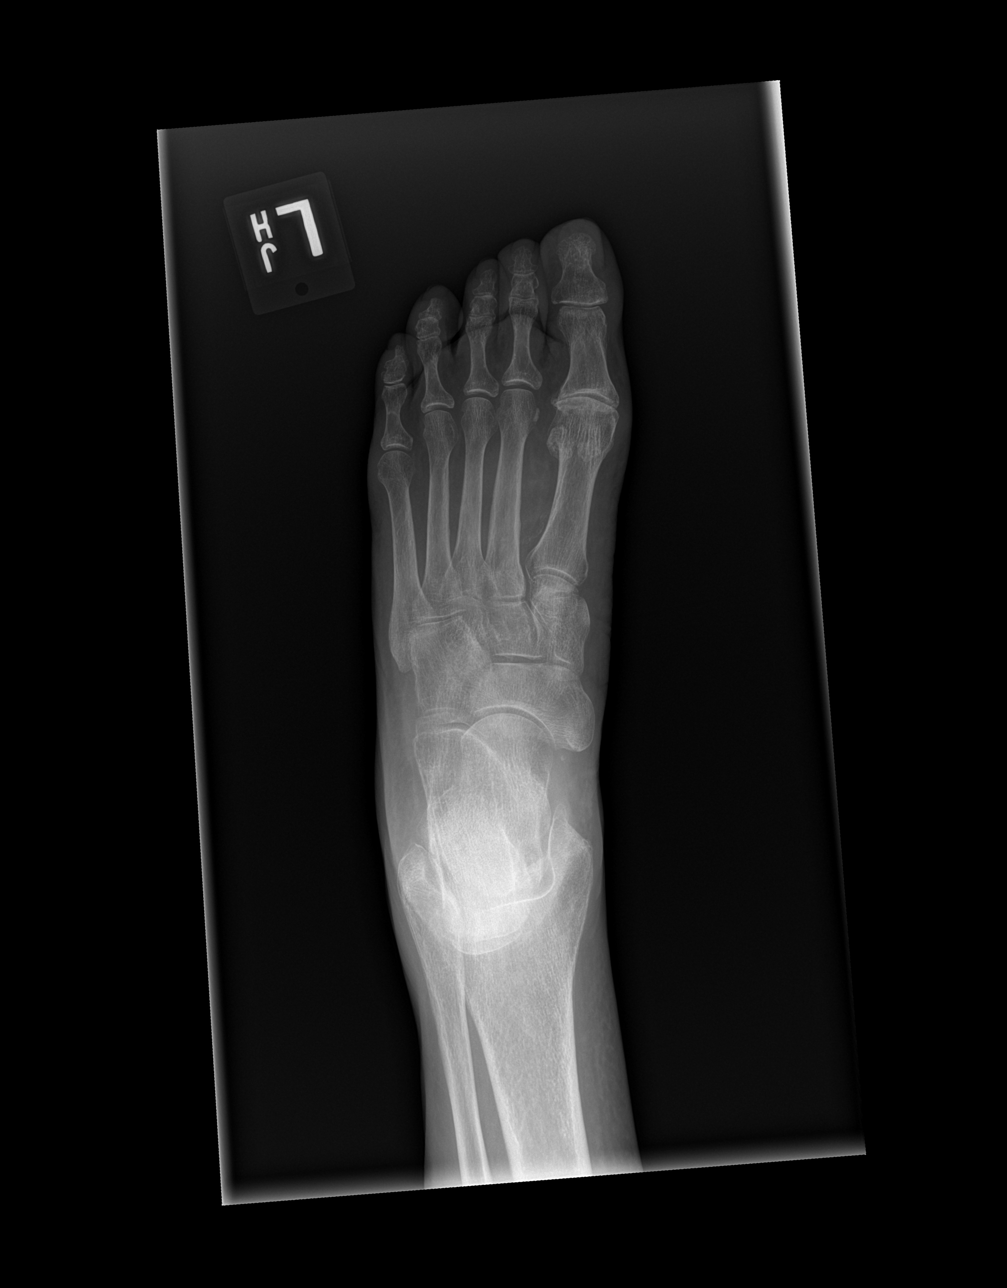

[x foot obl left]
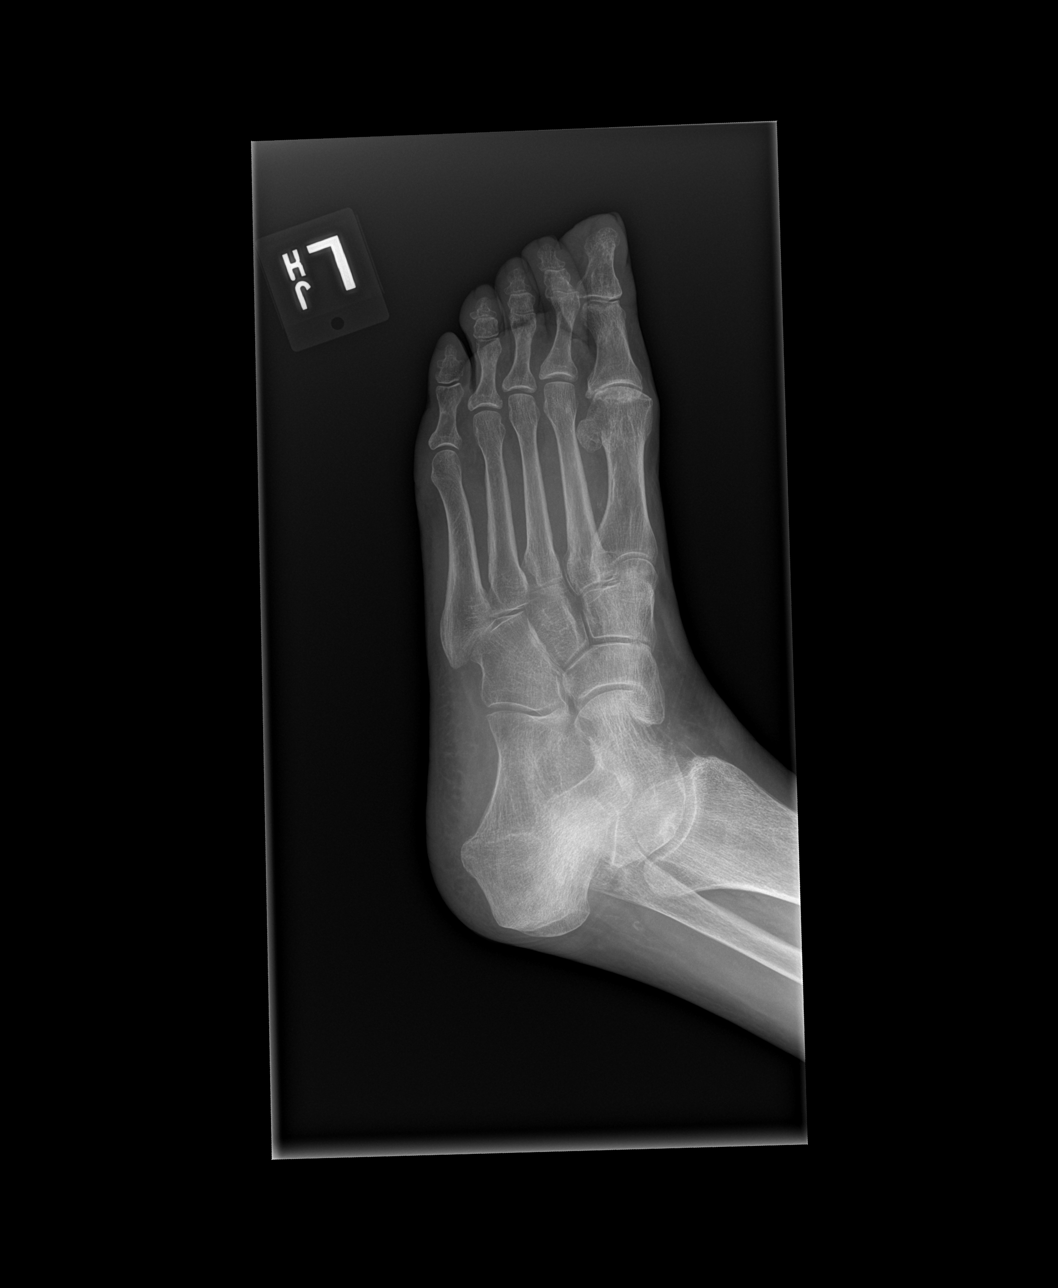

[x foot lat left]
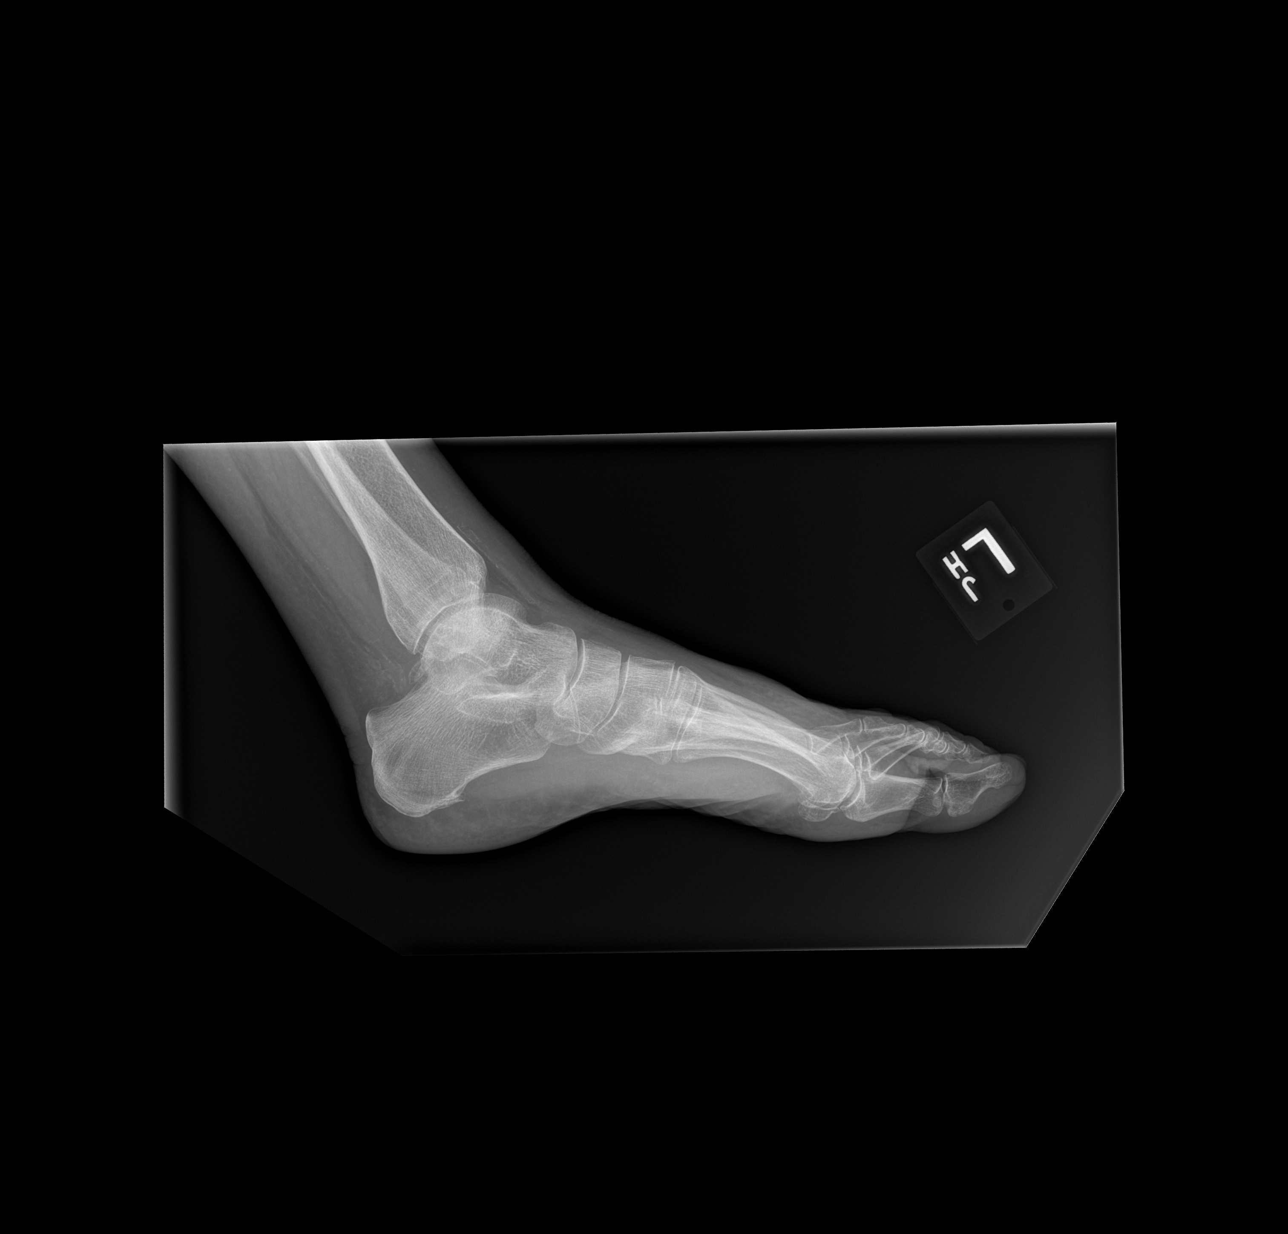

[3 of 3 positions shown; findings below may reference images not displayed]

FINDINGS: There is no evidence of fracture or dislocation. There is no
evidence of arthropathy or other focal bone abnormality. Soft
tissues are unremarkable.
IMPRESSION: Negative.

## 2020-12-24 ENCOUNTER — Other Ambulatory Visit (HOSPITAL_COMMUNITY): Payer: Self-pay | Admitting: Cardiovascular Disease

## 2020-12-24 DIAGNOSIS — I6523 Occlusion and stenosis of bilateral carotid arteries: Secondary | ICD-10-CM

## 2021-01-12 ENCOUNTER — Other Ambulatory Visit: Payer: Self-pay | Admitting: Cardiology

## 2021-01-13 DIAGNOSIS — E1165 Type 2 diabetes mellitus with hyperglycemia: Secondary | ICD-10-CM | POA: Diagnosis not present

## 2021-01-13 DIAGNOSIS — E1142 Type 2 diabetes mellitus with diabetic polyneuropathy: Secondary | ICD-10-CM | POA: Diagnosis not present

## 2021-02-04 ENCOUNTER — Other Ambulatory Visit: Payer: Self-pay | Admitting: Cardiology

## 2021-02-13 DIAGNOSIS — E1142 Type 2 diabetes mellitus with diabetic polyneuropathy: Secondary | ICD-10-CM | POA: Diagnosis not present

## 2021-02-13 DIAGNOSIS — E1165 Type 2 diabetes mellitus with hyperglycemia: Secondary | ICD-10-CM | POA: Diagnosis not present

## 2021-03-12 DIAGNOSIS — Z961 Presence of intraocular lens: Secondary | ICD-10-CM | POA: Diagnosis not present

## 2021-03-12 DIAGNOSIS — H524 Presbyopia: Secondary | ICD-10-CM | POA: Diagnosis not present

## 2021-03-12 DIAGNOSIS — E119 Type 2 diabetes mellitus without complications: Secondary | ICD-10-CM | POA: Diagnosis not present

## 2021-03-12 DIAGNOSIS — Z7984 Long term (current) use of oral hypoglycemic drugs: Secondary | ICD-10-CM | POA: Diagnosis not present

## 2021-03-12 DIAGNOSIS — H04123 Dry eye syndrome of bilateral lacrimal glands: Secondary | ICD-10-CM | POA: Diagnosis not present

## 2021-03-16 DIAGNOSIS — E1142 Type 2 diabetes mellitus with diabetic polyneuropathy: Secondary | ICD-10-CM | POA: Diagnosis not present

## 2021-03-16 DIAGNOSIS — E1165 Type 2 diabetes mellitus with hyperglycemia: Secondary | ICD-10-CM | POA: Diagnosis not present

## 2021-04-08 DIAGNOSIS — E1165 Type 2 diabetes mellitus with hyperglycemia: Secondary | ICD-10-CM | POA: Diagnosis not present

## 2021-04-08 DIAGNOSIS — I2584 Coronary atherosclerosis due to calcified coronary lesion: Secondary | ICD-10-CM | POA: Diagnosis not present

## 2021-04-08 DIAGNOSIS — M810 Age-related osteoporosis without current pathological fracture: Secondary | ICD-10-CM | POA: Diagnosis not present

## 2021-04-08 DIAGNOSIS — I10 Essential (primary) hypertension: Secondary | ICD-10-CM | POA: Diagnosis not present

## 2021-04-08 DIAGNOSIS — E782 Mixed hyperlipidemia: Secondary | ICD-10-CM | POA: Diagnosis not present

## 2021-04-16 DIAGNOSIS — E1142 Type 2 diabetes mellitus with diabetic polyneuropathy: Secondary | ICD-10-CM | POA: Diagnosis not present

## 2021-04-16 DIAGNOSIS — E1165 Type 2 diabetes mellitus with hyperglycemia: Secondary | ICD-10-CM | POA: Diagnosis not present

## 2021-04-17 DIAGNOSIS — I10 Essential (primary) hypertension: Secondary | ICD-10-CM | POA: Diagnosis not present

## 2021-04-17 DIAGNOSIS — R739 Hyperglycemia, unspecified: Secondary | ICD-10-CM | POA: Diagnosis not present

## 2021-04-17 DIAGNOSIS — Z79899 Other long term (current) drug therapy: Secondary | ICD-10-CM | POA: Diagnosis not present

## 2021-04-17 DIAGNOSIS — E1165 Type 2 diabetes mellitus with hyperglycemia: Secondary | ICD-10-CM | POA: Diagnosis not present

## 2021-04-17 DIAGNOSIS — I2584 Coronary atherosclerosis due to calcified coronary lesion: Secondary | ICD-10-CM | POA: Diagnosis not present

## 2021-04-17 DIAGNOSIS — M810 Age-related osteoporosis without current pathological fracture: Secondary | ICD-10-CM | POA: Diagnosis not present

## 2021-04-17 DIAGNOSIS — E782 Mixed hyperlipidemia: Secondary | ICD-10-CM | POA: Diagnosis not present

## 2021-04-17 DIAGNOSIS — E1169 Type 2 diabetes mellitus with other specified complication: Secondary | ICD-10-CM | POA: Diagnosis not present

## 2021-04-17 DIAGNOSIS — I208 Other forms of angina pectoris: Secondary | ICD-10-CM | POA: Diagnosis not present

## 2021-04-17 DIAGNOSIS — F17210 Nicotine dependence, cigarettes, uncomplicated: Secondary | ICD-10-CM | POA: Diagnosis not present

## 2021-04-22 ENCOUNTER — Telehealth: Payer: Self-pay

## 2021-04-22 NOTE — Telephone Encounter (Signed)
NOTES SCANNED TO REFERRAL 

## 2021-05-05 DIAGNOSIS — M8000XA Age-related osteoporosis with current pathological fracture, unspecified site, initial encounter for fracture: Secondary | ICD-10-CM | POA: Diagnosis not present

## 2021-05-17 DIAGNOSIS — E1165 Type 2 diabetes mellitus with hyperglycemia: Secondary | ICD-10-CM | POA: Diagnosis not present

## 2021-05-17 DIAGNOSIS — E1142 Type 2 diabetes mellitus with diabetic polyneuropathy: Secondary | ICD-10-CM | POA: Diagnosis not present

## 2021-06-17 DIAGNOSIS — E1165 Type 2 diabetes mellitus with hyperglycemia: Secondary | ICD-10-CM | POA: Diagnosis not present

## 2021-06-17 DIAGNOSIS — E1142 Type 2 diabetes mellitus with diabetic polyneuropathy: Secondary | ICD-10-CM | POA: Diagnosis not present

## 2021-06-19 ENCOUNTER — Telehealth: Payer: Self-pay | Admitting: Cardiology

## 2021-06-19 MED ORDER — TICAGRELOR 60 MG PO TABS: 60.0000 mg | ORAL_TABLET | Freq: Two times a day (BID) | ORAL | 2 refills | Status: AC

## 2021-06-19 NOTE — Telephone Encounter (Signed)
Pt's medication was sent to pt's pharmacy as requested. Confirmation received.  °

## 2021-06-19 NOTE — Telephone Encounter (Signed)
Pt c/o medication issue:  1. Name of Medication: ticagrelor (BRILINTA) 60 MG TABS tablet  2. How are you currently taking this medication (dosage and times per day)? Take 1 tablet (60 mg total) by mouth 2 (two) times daily.   3. Are you having a reaction (difficulty breathing--STAT)? No   4. What is your medication issue? Patient needs a new prescription sent in for a 60 day supply. Please send to  [id number] - [place], [place] - [address] [facility] [address]

## 2021-07-07 DIAGNOSIS — E1165 Type 2 diabetes mellitus with hyperglycemia: Secondary | ICD-10-CM | POA: Diagnosis not present

## 2021-07-07 DIAGNOSIS — E782 Mixed hyperlipidemia: Secondary | ICD-10-CM | POA: Diagnosis not present

## 2021-07-07 DIAGNOSIS — I10 Essential (primary) hypertension: Secondary | ICD-10-CM | POA: Diagnosis not present

## 2021-07-21 DIAGNOSIS — Z79899 Other long term (current) drug therapy: Secondary | ICD-10-CM | POA: Diagnosis not present

## 2021-07-21 DIAGNOSIS — E1169 Type 2 diabetes mellitus with other specified complication: Secondary | ICD-10-CM | POA: Diagnosis not present

## 2021-07-21 DIAGNOSIS — M810 Age-related osteoporosis without current pathological fracture: Secondary | ICD-10-CM | POA: Diagnosis not present

## 2021-07-23 DIAGNOSIS — I2584 Coronary atherosclerosis due to calcified coronary lesion: Secondary | ICD-10-CM | POA: Diagnosis not present

## 2021-07-23 DIAGNOSIS — I10 Essential (primary) hypertension: Secondary | ICD-10-CM | POA: Diagnosis not present

## 2021-07-23 DIAGNOSIS — E1169 Type 2 diabetes mellitus with other specified complication: Secondary | ICD-10-CM | POA: Diagnosis not present

## 2021-07-23 DIAGNOSIS — Z79899 Other long term (current) drug therapy: Secondary | ICD-10-CM | POA: Diagnosis not present

## 2021-08-21 DIAGNOSIS — E1165 Type 2 diabetes mellitus with hyperglycemia: Secondary | ICD-10-CM | POA: Diagnosis not present

## 2021-08-21 DIAGNOSIS — E1142 Type 2 diabetes mellitus with diabetic polyneuropathy: Secondary | ICD-10-CM | POA: Diagnosis not present

## 2021-09-08 ENCOUNTER — Encounter: Payer: Self-pay | Admitting: Cardiology

## 2021-09-08 ENCOUNTER — Ambulatory Visit: Payer: Medicare PPO | Admitting: Cardiology

## 2021-09-08 VITALS — BP 138/76 | HR 87 | Ht 61.0 in | Wt 158.2 lb

## 2021-09-08 DIAGNOSIS — I10 Essential (primary) hypertension: Secondary | ICD-10-CM

## 2021-09-08 DIAGNOSIS — R600 Localized edema: Secondary | ICD-10-CM

## 2021-09-08 DIAGNOSIS — I6523 Occlusion and stenosis of bilateral carotid arteries: Secondary | ICD-10-CM | POA: Diagnosis not present

## 2021-09-08 DIAGNOSIS — I2510 Atherosclerotic heart disease of native coronary artery without angina pectoris: Secondary | ICD-10-CM

## 2021-09-08 DIAGNOSIS — I739 Peripheral vascular disease, unspecified: Secondary | ICD-10-CM | POA: Diagnosis not present

## 2021-09-08 DIAGNOSIS — E782 Mixed hyperlipidemia: Secondary | ICD-10-CM | POA: Diagnosis not present

## 2021-09-08 MED ORDER — NITROGLYCERIN 0.4 MG SL SUBL: SUBLINGUAL_TABLET | SUBLINGUAL | 3 refills | Status: AC

## 2021-09-08 NOTE — Progress Notes (Signed)
Cardiology Office Note:    Date:  09/08/2021   ID:  Sarah Francis, DOB 1945/07/27, MRN [id number]  PCP:  Gweneth Dimitri, MD  Cardiologist:  Armanda Magic, MD    Referring MD: Gweneth Dimitri, MD   Chief Complaint  Patient presents with   Follow-up   Coronary Artery Disease   Hypertension   Hyperlipidemia    History of Present Illness:    Sarah Francis is a 76 y.o. female with a hx of HTN, PAD, tobacco abuse, and strong family of CAD.  Patient had coronary CT in 2018 with elevated calcium score and three-vessel coronary calcification.  Nuclear stress test showed no ischemia.  She was treated medically at that time.    She presented to the hospital 06/20/2018 with NSTEMI treated with overlapping stent in the proximal and mid RCA with residual mild to moderate nonobstructive disease 40% left main, 60% distal LAD 50% proximal circumflex to be treated medically with Aspirin and Brilinta for 1 year and atorvastatin increased to 80 mg daily.  2D echo showed normal LVEF 65% normal wall motion abnormality.  She was seen back in the office 06/2018 complaining of inability to take a deep breath in but had not been using caffeine prior to taking her Brilinta so this was encouraged. She is followed by Dr. Allyson Sabal for PAD.  She is here today for followup and is doing well.  She denies any chest pain or pressure, SOB, DOE, PND, orthopnea, dizziness, palpitations or syncope. She has chronic LE edema which is stable. She is compliant with her meds and is tolerating meds with no SE.     Past Medical History:  Diagnosis Date   Anxiety    Aortic atherosclerosis (HCC)    CAD (coronary artery disease), native coronary artery    06/20/18 PCI/DES overlapping stents to p/m RCA, mild disease in LAD, and Lcx   Carotid artery stenosis    40-59% by dopplers 12/2018   Diabetes mellitus without complication (HCC)    GERD (gastroesophageal reflux disease)    HLD (hyperlipidemia)    Hypertension    Major depression     initial diagnosis/medication around age 78   Osteoporosis    w h/o sacral fracture   PAD (peripheral artery disease) (HCC)    high grade stenosis in mid left SFA - followed by Dr. Allyson Sabal   Syncope 12/2018   felt to be vasovagal    Past Surgical History:  Procedure Laterality Date   ABDOMINAL HYSTERECTOMY     CHOLECYSTECTOMY N/A 09/13/2020   Procedure: LAPAROSCOPIC CHOLECYSTECTOMY;  Surgeon: Axel Filler, MD;  [facility];  Service: General;  Laterality: N/A;   CORONARY STENT INTERVENTION N/A 06/20/2018   Procedure: CORONARY STENT INTERVENTION;  Surgeon: Tonny Bollman, MD;  Location[facility] INVASIVE CV LAB;  Service: Cardiovascular;  Laterality: N/A;   KNEE SURGERY Right 2012   LEFT HEART CATH AND CORONARY ANGIOGRAPHY N/A 06/20/2018   Procedure: LEFT HEART CATH AND CORONARY ANGIOGRAPHY;  Surgeon: Tonny Bollman, MD;  Location: [facility] INVASIVE CV LAB;  Service: Cardiovascular;  Laterality: N/A;    Current Medications: Current Meds  Medication Sig   empagliflozin (JARDIANCE) 10 MG TABS tablet Take 10 mg by mouth daily.     Allergies:   Gabapentin, Penicillin g, and Penicillins   Social History   Socioeconomic History   Marital status: Single    Spouse name: Not on file   Number of children: Not on file   Years of education: Not on file  Highest education level: Not on file  Occupational History   Not on file  Tobacco Use   Smoking status: Every Day    Packs/day: 1.00    Years: 88.00    Total pack years: 88.00    Types: Cigarettes   Smokeless tobacco: Never   Tobacco comments:    Currently smoker 1ppd  Substance and Sexual Activity   Alcohol use: Yes    Comment: 1-2 glasses of Wine per day   Drug use: No   Sexual activity: Not Currently  Other Topics Concern   Not on file  Social History Narrative   Not on file   Social Determinants of Health   Financial Resource Strain: Not on file  Food Insecurity: Not on file  Transportation Needs: Not on file  Physical  Activity: Not on file  Stress: Not on file  Social Connections: Not on file     Family History: The patient's family history includes AAA (abdominal aortic aneurysm) in her father; Cancer in her brother; Diabetes in her brother; Heart attack in her mother; Heart disease in her mother; Hypertension in her sister.  ROS:   Please see the history of present illness.    ROS  All other systems reviewed and negative.   EKGs/Labs/Other Studies Reviewed:    The following studies were reviewed today: Cath, labs   EKG:  EKG is ordered today showed NSR with PACs and T wave abnormality in the lateral leads  Recent Labs: 09/12/2020: TSH 1.502 09/13/2020: Hemoglobin 11.2; Platelets 258 09/14/2020: ALT 21; BUN 11; Creatinine, Ser 0.88; Potassium 4.4; Sodium 127   Recent Lipid Panel    Component Value Date/Time   CHOL 137 06/17/2018 0357   TRIG 211 (H) 06/17/2018 0357   HDL 35 (L) 06/17/2018 0357   CHOLHDL 3.9 06/17/2018 0357   VLDL 42 (H) 06/17/2018 0357   LDLCALC 60 06/17/2018 0357    Physical Exam:    VS:  BP 138/76   Pulse 87   Ht 5\' 1"  (1.549 m)   Wt 158 lb 3.2 oz (71.8 kg)   SpO2 98%   BMI 29.89 kg/m     Wt Readings from Last 3 Encounters:  09/08/21 158 lb 3.2 oz (71.8 kg)  09/09/20 155 lb (70.3 kg)  09/07/20 156 lb (70.8 kg)   GEN: Well nourished, well developed in no acute distress HEENT: Normal NECK: No JVD; No carotid bruits LYMPHATICS: No lymphadenopathy CARDIAC:RRR, no murmurs, rubs, gallops RESPIRATORY:  Clear to auscultation without rales, wheezing or rhonchi  ABDOMEN: Soft, non-tender, non-distended MUSCULOSKELETAL:  No edema; No deformity  SKIN: Warm and dry NEUROLOGIC:  Alert and oriented x 3 PSYCHIATRIC:  Normal affect   ASSESSMENT:    1. Coronary artery disease involving native coronary artery of native heart without angina pectoris   2. Mixed hyperlipidemia   3. Essential hypertension   4. Peripheral arterial disease ([place])   5. Bilateral carotid  artery stenosis   6. Leg edema    PLAN:    In order of problems listed above:  1.  ASCAD -s/p NSTEMI 05/2018 -cath with overlapping stent in the proximal and mid RCA with residual mild to moderate nonobstructive disease 40% left main, 60% distal LAD 50% proximal circumflex -noncompliant with BB and Brilinta in the past resulting in breakthrough angina but now back on Meds -she has not had any anginal sx -continue prescription drug management with ASA 81mg  daily, Brilinta 60mg  BID, BB and statin  2.  HLD -LDL goal <  2 -I have personally reviewed and interpreted outside labs performed by patient's PCP which showed LDL 40 and HDL 34 on 04/17/3021  -continue prescription drug management with Atorvastatin 80mg  daily  3.  HTN -BP controlled on exam -Continue prescription drug management with Lisinopril 5mg  daily and Lopressor 50mg  BID with PRN refills -I have personally reviewed and interpreted outside labs performed by patient's PCP which showed SCr 0.92 and HDL 34 in march 2023   4.  PAD -has seen Dr. following ABIs which revealed a right ABI of 0.91 and a left ABI of 0.68 with what appeared to be a high-grade lesion in the mid left SFA with monophasic waveforms below that.  -medical management recommended -needs to quit smoking   5.  Carotid artery stenosis -dopplers 10/22 showed 40-59% stenosis bilaterally and 1-39% right ICA stenosis and 40-59% left ICA stenosis. -repeat dopplers 10/2021 -continue ASA and statin  6.  Presyncope -2D echo normal LVF with no significant valvular heart dz in Nov 2020  7.  Chronic LE edema -this is stable -Likely multifactorial from LE neuropathy, significant dietary indiscretion with NA and plantar fasciitis -I encouraged her to cut out all table salt and follow < 2gm Na diet   Medication Adjustments/Labs and Tests Ordered: Current medicines are reviewed at length with the patient today.  Concerns regarding medicines are outlined above.   Orders Placed This Encounter  Procedures   EKG 12-Lead   Meds ordered this encounter  Medications   nitroGLYCERIN (NITROSTAT) 0.4 MG SL tablet    Sig: PLACE 1 TABLET UNDER THE TONGUE EVERY 11/22 FOR 3 DOSES AS NEEDED FOR CHEST PAIN    Dispense:  25 tablet    Refill:  3    PLEASE CONTACT THE OFFICE FOR AN APPOINTMENT FOR ADDITIONAL REFILLS 1ST ATTEMPT    Signed, 11/2021, MD  09/08/2021 3:42 PM    [facility]

## 2021-09-08 NOTE — Patient Instructions (Signed)
Medication Instructions:  Your physician recommends that you continue on your current medications as directed. Please refer to the Current Medication list given to you today.  *If you need a refill on your cardiac medications before your next appointment, please call your pharmacy*   Testing/Procedures: Your physician has requested that you have a carotid duplex in October 2023. This test is an ultrasound of the carotid arteries in your neck. It looks at blood flow through these arteries that supply the brain with blood. Allow one hour for this exam. There are no restrictions or special instructions.   Follow-Up: At [facility], you and your health needs are our priority.  As part of our continuing mission to provide you with exceptional heart care, we have created designated Provider Care Teams.  These Care Teams include your primary Cardiologist (physician) and Advanced Practice Providers (APPs -  Physician Assistants and Nurse Practitioners) who all work together to provide you with the care you need, when you need it.  Your next appointment:   1 year(s)  The format for your next appointment:   In Person  Provider:   Armanda Magic, MD    Important Information About Sugar

## 2021-09-21 DIAGNOSIS — E1142 Type 2 diabetes mellitus with diabetic polyneuropathy: Secondary | ICD-10-CM | POA: Diagnosis not present

## 2021-09-21 DIAGNOSIS — E1165 Type 2 diabetes mellitus with hyperglycemia: Secondary | ICD-10-CM | POA: Diagnosis not present

## 2021-09-23 ENCOUNTER — Ambulatory Visit: Payer: Self-pay | Admitting: Licensed Clinical Social Worker

## 2021-09-23 NOTE — Patient Outreach (Signed)
  Care Coordination   09/23/2021 Name: Sarah Francis MRN: [id number] DOB: 1945-12-05   Care Coordination Outreach Attempts:  An unsuccessful telephone outreach was attempted today to offer the patient information about available care coordination services as a benefit of their health plan.   Follow Up Plan:  Additional outreach attempts will be made to offer the patient care coordination information and services.   Encounter Outcome:  No Answer  Care Coordination Interventions Activated:  No   Care Coordination Interventions:  No, not indicated    Sammuel Hines, [facility] Care Coordination  [facility] [employer] [phone]

## 2021-10-19 DIAGNOSIS — E1165 Type 2 diabetes mellitus with hyperglycemia: Secondary | ICD-10-CM | POA: Diagnosis not present

## 2021-10-19 DIAGNOSIS — E782 Mixed hyperlipidemia: Secondary | ICD-10-CM | POA: Diagnosis not present

## 2021-10-19 DIAGNOSIS — M810 Age-related osteoporosis without current pathological fracture: Secondary | ICD-10-CM | POA: Diagnosis not present

## 2021-10-19 DIAGNOSIS — I10 Essential (primary) hypertension: Secondary | ICD-10-CM | POA: Diagnosis not present

## 2021-10-22 DIAGNOSIS — E1142 Type 2 diabetes mellitus with diabetic polyneuropathy: Secondary | ICD-10-CM | POA: Diagnosis not present

## 2021-10-22 DIAGNOSIS — E1165 Type 2 diabetes mellitus with hyperglycemia: Secondary | ICD-10-CM | POA: Diagnosis not present

## 2021-11-06 ENCOUNTER — Telehealth: Payer: Self-pay

## 2021-11-06 NOTE — Patient Outreach (Signed)
  Care Coordination   11/06/2021 Name: Sarah Francis MRN: [id number] DOB: 08-Dec-1945   Care Coordination Outreach Attempts:  A second unsuccessful outreach was attempted today to offer the patient with information about available care coordination services as a benefit of their health plan.     Follow Up Plan:  Additional outreach attempts will be made to offer the patient care coordination information and services.   Encounter Outcome:  No Answer  Care Coordination Interventions Activated:  No   Care Coordination Interventions:  No, not indicated    [place] [facility] [phone]

## 2021-11-10 ENCOUNTER — Ambulatory Visit (HOSPITAL_COMMUNITY)
Admission: RE | Admit: 2021-11-10 | Discharge: 2021-11-10 | Disposition: A | Discharge status: Home / Self Care | Payer: Medicare PPO | Source: Ambulatory Visit | Attending: Cardiovascular Disease | Admitting: Cardiovascular Disease

## 2021-11-10 DIAGNOSIS — I6523 Occlusion and stenosis of bilateral carotid arteries: Secondary | ICD-10-CM | POA: Diagnosis not present

## 2021-11-11 ENCOUNTER — Telehealth: Payer: Self-pay

## 2021-11-11 DIAGNOSIS — I10 Essential (primary) hypertension: Secondary | ICD-10-CM | POA: Diagnosis not present

## 2021-11-11 DIAGNOSIS — E1142 Type 2 diabetes mellitus with diabetic polyneuropathy: Secondary | ICD-10-CM | POA: Diagnosis not present

## 2021-11-11 DIAGNOSIS — E782 Mixed hyperlipidemia: Secondary | ICD-10-CM | POA: Diagnosis not present

## 2021-11-11 DIAGNOSIS — M810 Age-related osteoporosis without current pathological fracture: Secondary | ICD-10-CM | POA: Diagnosis not present

## 2021-11-11 DIAGNOSIS — E1169 Type 2 diabetes mellitus with other specified complication: Secondary | ICD-10-CM | POA: Diagnosis not present

## 2021-11-11 DIAGNOSIS — I2584 Coronary atherosclerosis due to calcified coronary lesion: Secondary | ICD-10-CM | POA: Diagnosis not present

## 2021-11-11 DIAGNOSIS — K219 Gastro-esophageal reflux disease without esophagitis: Secondary | ICD-10-CM | POA: Diagnosis not present

## 2021-11-11 DIAGNOSIS — Z79899 Other long term (current) drug therapy: Secondary | ICD-10-CM | POA: Diagnosis not present

## 2021-11-11 DIAGNOSIS — Z23 Encounter for immunization: Secondary | ICD-10-CM | POA: Diagnosis not present

## 2021-11-11 DIAGNOSIS — Z794 Long term (current) use of insulin: Secondary | ICD-10-CM | POA: Diagnosis not present

## 2021-11-11 DIAGNOSIS — B372 Candidiasis of skin and nail: Secondary | ICD-10-CM | POA: Diagnosis not present

## 2021-11-11 NOTE — Patient Outreach (Signed)
  Care Coordination   11/11/2021 Name: Sarah Francis MRN: [id number] DOB: October 21, 1945   Care Coordination Outreach Attempts:  A third unsuccessful outreach was attempted today to offer the patient with information about available care coordination services as a benefit of their health plan.   Follow Up Plan:  No further outreach attempts will be made at this time. We have been unable to contact the patient to offer or enroll patient in care coordination services  Encounter Outcome:  No Answer  Care Coordination Interventions Activated:  No   Care Coordination Interventions:  No, not indicated    [place] [facility] [phone]

## 2021-11-22 DIAGNOSIS — E1165 Type 2 diabetes mellitus with hyperglycemia: Secondary | ICD-10-CM | POA: Diagnosis not present

## 2021-11-22 DIAGNOSIS — E1142 Type 2 diabetes mellitus with diabetic polyneuropathy: Secondary | ICD-10-CM | POA: Diagnosis not present

## 2021-12-04 DIAGNOSIS — S61451A Open bite of right hand, initial encounter: Secondary | ICD-10-CM | POA: Diagnosis not present

## 2021-12-04 DIAGNOSIS — W5501XA Bitten by cat, initial encounter: Secondary | ICD-10-CM | POA: Diagnosis not present

## 2021-12-23 DIAGNOSIS — E1142 Type 2 diabetes mellitus with diabetic polyneuropathy: Secondary | ICD-10-CM | POA: Diagnosis not present

## 2021-12-23 DIAGNOSIS — E1165 Type 2 diabetes mellitus with hyperglycemia: Secondary | ICD-10-CM | POA: Diagnosis not present

## 2021-12-28 ENCOUNTER — Encounter: Payer: Self-pay | Admitting: *Deleted

## 2022-01-23 DIAGNOSIS — E1142 Type 2 diabetes mellitus with diabetic polyneuropathy: Secondary | ICD-10-CM | POA: Diagnosis not present

## 2022-01-23 DIAGNOSIS — E1165 Type 2 diabetes mellitus with hyperglycemia: Secondary | ICD-10-CM | POA: Diagnosis not present

## 2022-02-23 DIAGNOSIS — E1165 Type 2 diabetes mellitus with hyperglycemia: Secondary | ICD-10-CM | POA: Diagnosis not present

## 2022-02-23 DIAGNOSIS — E1142 Type 2 diabetes mellitus with diabetic polyneuropathy: Secondary | ICD-10-CM | POA: Diagnosis not present

## 2022-06-11 DIAGNOSIS — E1165 Type 2 diabetes mellitus with hyperglycemia: Secondary | ICD-10-CM | POA: Diagnosis not present

## 2022-06-11 DIAGNOSIS — E1142 Type 2 diabetes mellitus with diabetic polyneuropathy: Secondary | ICD-10-CM | POA: Diagnosis not present

## 2022-06-14 DIAGNOSIS — R531 Weakness: Secondary | ICD-10-CM | POA: Diagnosis not present

## 2022-06-14 DIAGNOSIS — I10 Essential (primary) hypertension: Secondary | ICD-10-CM | POA: Diagnosis not present

## 2022-06-14 DIAGNOSIS — F17210 Nicotine dependence, cigarettes, uncomplicated: Secondary | ICD-10-CM | POA: Diagnosis not present

## 2022-06-14 DIAGNOSIS — E1142 Type 2 diabetes mellitus with diabetic polyneuropathy: Secondary | ICD-10-CM | POA: Diagnosis not present

## 2022-06-14 DIAGNOSIS — E1151 Type 2 diabetes mellitus with diabetic peripheral angiopathy without gangrene: Secondary | ICD-10-CM | POA: Diagnosis not present

## 2022-06-14 DIAGNOSIS — E782 Mixed hyperlipidemia: Secondary | ICD-10-CM | POA: Diagnosis not present

## 2022-06-14 DIAGNOSIS — E559 Vitamin D deficiency, unspecified: Secondary | ICD-10-CM | POA: Diagnosis not present

## 2022-06-14 DIAGNOSIS — Z794 Long term (current) use of insulin: Secondary | ICD-10-CM | POA: Diagnosis not present

## 2022-06-14 DIAGNOSIS — R634 Abnormal weight loss: Secondary | ICD-10-CM | POA: Diagnosis not present

## 2022-06-15 ENCOUNTER — Emergency Department (HOSPITAL_BASED_OUTPATIENT_CLINIC_OR_DEPARTMENT_OTHER)
Admission: EM | Admit: 2022-06-15 | Discharge: 2022-06-15 | Disposition: A | Discharge status: Home / Self Care | Payer: Medicare PPO | Attending: Emergency Medicine | Admitting: Emergency Medicine

## 2022-06-15 ENCOUNTER — Other Ambulatory Visit: Payer: Self-pay

## 2022-06-15 ENCOUNTER — Encounter (HOSPITAL_BASED_OUTPATIENT_CLINIC_OR_DEPARTMENT_OTHER): Payer: Self-pay

## 2022-06-15 DIAGNOSIS — R739 Hyperglycemia, unspecified: Secondary | ICD-10-CM | POA: Diagnosis not present

## 2022-06-15 DIAGNOSIS — Z794 Long term (current) use of insulin: Secondary | ICD-10-CM | POA: Diagnosis not present

## 2022-06-15 DIAGNOSIS — E1165 Type 2 diabetes mellitus with hyperglycemia: Secondary | ICD-10-CM | POA: Diagnosis not present

## 2022-06-15 LAB — CBC WITH DIFFERENTIAL/PLATELET
Abs Immature Granulocytes: 0.06 10*3/uL (ref 0.00–0.07)
Basophils Absolute: 0 10*3/uL (ref 0.0–0.1)
Basophils Relative: 0 %
Eosinophils Absolute: 0 10*3/uL (ref 0.0–0.5)
Eosinophils Relative: 0 %
HCT: 42.9 % (ref 36.0–46.0)
Hemoglobin: 15.1 g/dL — ABNORMAL HIGH (ref 12.0–15.0)
Immature Granulocytes: 1 %
Lymphocytes Relative: 14 %
Lymphs Abs: 1.2 10*3/uL (ref 0.7–4.0)
MCH: 33.5 pg (ref 26.0–34.0)
MCHC: 35.2 g/dL (ref 30.0–36.0)
MCV: 95.1 fL (ref 80.0–100.0)
Monocytes Absolute: 0.8 10*3/uL (ref 0.1–1.0)
Monocytes Relative: 9 %
Neutro Abs: 6.3 10*3/uL (ref 1.7–7.7)
Neutrophils Relative %: 76 %
Platelets: 234 10*3/uL (ref 150–400)
RBC: 4.51 MIL/uL (ref 3.87–5.11)
RDW: 12.1 % (ref 11.5–15.5)
WBC: 8.4 10*3/uL (ref 4.0–10.5)
nRBC: 0 % (ref 0.0–0.2)

## 2022-06-15 LAB — BASIC METABOLIC PANEL
Anion gap: 15 (ref 5–15)
BUN: 14 mg/dL (ref 8–23)
CO2: 22 mmol/L (ref 22–32)
Calcium: 9.6 mg/dL (ref 8.9–10.3)
Chloride: 95 mmol/L — ABNORMAL LOW (ref 98–111)
Creatinine, Ser: 0.88 mg/dL (ref 0.44–1.00)
GFR, Estimated: >60 mL/min (ref >60)
Glucose, Bld: 433 mg/dL — ABNORMAL HIGH (ref 70–99)
Potassium: 4 mmol/L (ref 3.5–5.1)
Sodium: 132 mmol/L — ABNORMAL LOW (ref 135–145)

## 2022-06-15 LAB — I-STAT VENOUS BLOOD GAS, ED
Acid-base deficit: 4 mmol/L — ABNORMAL HIGH (ref 0.0–2.0)
Bicarbonate: 21.3 mmol/L (ref 20.0–28.0)
Calcium, Ion: 1.24 mmol/L (ref 1.15–1.40)
HCT: 46 % (ref 36.0–46.0)
Hemoglobin: 15.6 g/dL — ABNORMAL HIGH (ref 12.0–15.0)
O2 Saturation: 40 %
Potassium: 4 mmol/L (ref 3.5–5.1)
Sodium: 130 mmol/L — ABNORMAL LOW (ref 135–145)
TCO2: 23 mmol/L (ref 22–32)
pCO2, Ven: 40.1 mmHg — ABNORMAL LOW (ref 44–60)
pH, Ven: 7.334 (ref 7.25–7.43)
pO2, Ven: 24 mmHg — CL (ref 32–45)

## 2022-06-15 LAB — CBG MONITORING, ED: Glucose-Capillary: 405 mg/dL — ABNORMAL HIGH (ref 70–99)

## 2022-06-15 MED ORDER — INSULIN ASPART 100 UNIT/ML IJ SOLN
5.0000 [IU] | Freq: Once | INTRAMUSCULAR | Status: AC
  Administered 2022-06-15: 5 [IU] via SUBCUTANEOUS

## 2022-06-15 MED ORDER — SODIUM CHLORIDE 0.9 % IV BOLUS
1000.0000 mL | Freq: Once | INTRAVENOUS | Status: AC
  Administered 2022-06-15: 1000 mL via INTRAVENOUS

## 2022-06-15 NOTE — ED Triage Notes (Signed)
Patient here POV from Home.  Endorses being in PCP Office yesterday for Bilateral Foot Numbness for 3 Weeks. Told BG was 499 and was told her ECG was "Abnormal". Instructed to seek ED Evaluation.  No Pain. No SOB. No N/V. Some Diarrhea. No Fevers.  NAD Noted during Triage. A&Ox4. GCS 15. BIB Wheelchair.

## 2022-06-15 NOTE — Discharge Instructions (Signed)
Continue your diabetes medications as prescribed by your primary care doctor.

## 2022-06-15 NOTE — ED Provider Notes (Addendum)
[place] EMERGENCY DEPARTMENT AT [facility] Provider Note   CSN: [id number] Arrival date & time: 06/15/22  2113     History  Chief Complaint  Patient presents with   Hyperglycemia    Sarah Francis is a 77 y.o. female.  Patient here for evaluation of high blood sugar.  She is on insulin.  Denies any chest pain or shortness of breath.  She had an EKG done yesterday that might have been abnormal when she went to her primary care doctor yesterday.  She has been having some neuropathy in her feet.  Denies any weakness or numbness or tingling or fevers or chills.  Some diarrhea.  No chest pain or shortness of breath.  Blood sugar was 499.  The history is provided by the patient.       Home Medications Prior to Admission medications   Medication Sig Start Date End Date Taking? Authorizing Provider  aspirin EC 81 MG tablet Take 81 mg by mouth daily.    [provider]  atorvastatin (LIPITOR) 80 MG tablet TAKE 1 TABLET BY MOUTH EVERY DAY AT 6PM 11/15/18   Quintella Reichert, MD  cetirizine (ZYRTEC) 10 MG tablet Take 10 mg by mouth every morning.    [provider]  cholecalciferol (VITAMIN D3) 25 MCG (1000 UT) tablet Take 2,000 Units by mouth daily.    [provider]  cycloSPORINE (RESTASIS) 0.05 % ophthalmic emulsion Place 1 drop into both eyes 2 (two) times daily.    [provider]  DULoxetine (CYMBALTA) 60 MG capsule Take 60 mg by mouth daily. 10/30/18   [provider]  empagliflozin (JARDIANCE) 10 MG TABS tablet Take 10 mg by mouth daily. 07/23/21   [provider]  HUMALOG KWIKPEN 100 UNIT/ML KwikPen Inject 12 Units into the skin in the morning and at bedtime. 06/13/19   [provider]  insulin glargine (LANTUS SOLOSTAR) 100 UNIT/ML Solostar Pen Inject 30 Units into the skin 2 (two) times daily.    [provider]  lisinopril (ZESTRIL) 5 MG tablet Take 5 mg by mouth daily. 10/26/19   [provider]  metoprolol tartrate (LOPRESSOR) 50 MG tablet TAKE 1 TABLET BY MOUTH TWICE A DAY 09/18/19   Turner, Cornelious Bryant, MD  nitroGLYCERIN (NITROSTAT) 0.4 MG SL tablet PLACE 1 TABLET UNDER THE TONGUE EVERY FOR 3 DOSES AS NEEDED FOR CHEST PAIN 09/08/21   Quintella Reichert, MD  omeprazole (PRILOSEC) 20 MG capsule Take 20 mg by mouth daily.    [provider]  ondansetron (ZOFRAN) 4 MG tablet Take 1 tablet (4 mg total) by mouth every 6 (six) hours as needed for nausea. 09/14/20   Myrtie Neither, MD  oxyCODONE (OXY IR/ROXICODONE) 5 MG immediate release tablet Take 1 tablet (5 mg total) by mouth every 6 (six) hours as needed for moderate pain. 09/14/20   Myrtie Neither, MD  ticagrelor (BRILINTA) 60 MG TABS tablet Take 1 tablet (60 mg total) by mouth 2 (two) times daily. 06/19/21   Quintella Reichert, MD  traZODone (DESYREL) 50 MG tablet Take 100-150 mg by mouth at bedtime. 07/26/20   [provider]  vitamin B-12 (CYANOCOBALAMIN) 1000 MCG tablet Take 1,000 mcg by mouth daily.    [provider]      Allergies    Gabapentin, Penicillin g, and Penicillins    Review of Systems   Review of Systems  Physical Exam Updated Vital Signs BP 137/67   Pulse 96   Temp  98.1 F (36.7 C) (Oral)   Resp (!) 26   Ht 5\' 1"  (1.549 m)   Wt 63 kg   SpO2 96%   BMI 26.26 kg/m  Physical Exam Vitals and nursing note reviewed.  Constitutional:      General: She is not in acute distress.    Appearance: She is well-developed. She is not ill-appearing.  HENT:     Head: Normocephalic and atraumatic.     Nose: Nose normal.     Mouth/Throat:     Mouth: Mucous membranes are moist.  Eyes:     Extraocular Movements: Extraocular movements intact.     Conjunctiva/sclera: Conjunctivae normal.     Pupils: Pupils are equal, round, and reactive to light.  Cardiovascular:     Rate and Rhythm: Normal rate and regular rhythm.     Pulses: Normal pulses.     Heart sounds: Normal heart sounds. No murmur  heard. Pulmonary:     Effort: Pulmonary effort is normal. No respiratory distress.     Breath sounds: Normal breath sounds.  Abdominal:     Palpations: Abdomen is soft.     Tenderness: There is no abdominal tenderness.  Musculoskeletal:        General: No swelling.     Cervical back: Normal range of motion and neck supple.     Comments: No midline spinal pain  Skin:    General: Skin is warm and dry.     Capillary Refill: Capillary refill takes less than 2 seconds.  Neurological:     General: No focal deficit present.     Mental Status: She is alert.     Comments: Decree sensation from her mid shin down and symmetric bilaterally, she able to plantarflex well but she is got some weakness in dorsiflexion bilaterally otherwise strength and sensation are normal throughout,  Psychiatric:        Mood and Affect: Mood normal.     ED Results / Procedures / Treatments   Labs (all labs ordered are listed, but only abnormal results are displayed) Labs Reviewed  CBC WITH DIFFERENTIAL/PLATELET - Abnormal; Notable for the following components:      Result Value   Hemoglobin 15.1 (*)    All other components within normal limits  BASIC METABOLIC PANEL - Abnormal; Notable for the following components:   Sodium 132 (*)    Chloride 95 (*)    Glucose, Bld 433 (*)    All other components within normal limits  CBG MONITORING, ED - Abnormal; Notable for the following components:   Glucose-Capillary 405 (*)    All other components within normal limits  I-STAT VENOUS BLOOD GAS, ED - Abnormal; Notable for the following components:   pCO2, Ven 40.1 (*)    pO2, Ven 24 (*)    Acid-base deficit 4.0 (*)    Sodium 130 (*)    Hemoglobin 15.6 (*)    All other components within normal limits  BLOOD GAS, VENOUS    EKG EKG Interpretation  Date/Time:  Tuesday Jun 15 2022 21:25:59 EDT Ventricular Rate:  100 PR Interval:  132 QRS Duration: 82 QT Interval:  329 QTC Calculation: 425 R Axis:   47 Text  Interpretation: Sinus tachycardia no change from prior Confirmed by Lockie Mola, Norleen Xie ([id number]) on 06/15/2022 9:27:24 PM  Radiology No results found.  Procedures Procedures    Medications Ordered in ED Medications  sodium chloride 0.9 % bolus 1,000 mL (0 mLs Intravenous Stopped 06/15/22 2217)  insulin aspart (novoLOG) injection  5 Units (5 Units Subcutaneous Given 06/15/22 2217)    ED Course/ Medical Decision Making/ A&P                             Medical Decision Making Amount and/or Complexity of Data Reviewed Labs: ordered.  Risk Prescription drug management.   Helvi Srader is here with high blood sugar.  Normal vitals.  No fever.  EKG shows sinus rhythm.  No ischemic changes.  Unchanged from prior EKGs.  She has no symptoms.  She was told to come here by primary care doctor because her blood sugar is elevated.  She has been noncompliant with her insulin and oral diabetes meds.  She has been struggling with neuropathy in her feet now recently as well.  normal pulses in her feet which I also confirmed with Doppler..  Overall I suspect diabetic neuropathy.  She has symmetric numbness in both feet.  She has had a little bit of weakness in dorsiflexion bilaterally but it symmetric.  I suspect some chronic neuropathy and issues from diabetes especially with her noncompliance.  Her A1c was 12.4.  Will rule out DKA with labs.  She is very well-appearing.  Will give IV fluids and anticipate giving her some insulin.  Lab work my review and interpretation is overall unremarkable.  Blood sugar in the 400s with patient's not in DKA.  She is given home dose of insulin.  Recommend that she continue her insulin and diabetes treatment per her primary care doctor recommendations.  Discharged in good condition.  Understands return precautions.  She will follow-up with neurology regarding her neuropathy in her feet and chronic weakness in her ankles.  Will have her also follow-up with vascular surgery.  This  chart was dictated using voice recognition software.  Despite best efforts to proofread,  errors can occur which can change the documentation meaning.         Final Clinical Impression(s) / ED Diagnoses Final diagnoses:  Hyperglycemia    Rx / DC Orders ED Discharge Orders     None         Virgina Norfolk, DO 06/15/22 2241    Virgina Norfolk, DO 06/15/22 2302    Virgina Norfolk, DO 06/15/22 2302

## 2022-07-16 DIAGNOSIS — E1142 Type 2 diabetes mellitus with diabetic polyneuropathy: Secondary | ICD-10-CM | POA: Diagnosis not present

## 2022-07-16 DIAGNOSIS — E1165 Type 2 diabetes mellitus with hyperglycemia: Secondary | ICD-10-CM | POA: Diagnosis not present

## 2022-08-16 DIAGNOSIS — E1165 Type 2 diabetes mellitus with hyperglycemia: Secondary | ICD-10-CM | POA: Diagnosis not present

## 2022-08-16 DIAGNOSIS — E1142 Type 2 diabetes mellitus with diabetic polyneuropathy: Secondary | ICD-10-CM | POA: Diagnosis not present

## 2022-08-23 DIAGNOSIS — E782 Mixed hyperlipidemia: Secondary | ICD-10-CM | POA: Diagnosis not present

## 2022-08-23 DIAGNOSIS — M810 Age-related osteoporosis without current pathological fracture: Secondary | ICD-10-CM | POA: Diagnosis not present

## 2022-08-23 DIAGNOSIS — Z91148 Patient's other noncompliance with medication regimen for other reason: Secondary | ICD-10-CM | POA: Diagnosis not present

## 2022-08-23 DIAGNOSIS — E1142 Type 2 diabetes mellitus with diabetic polyneuropathy: Secondary | ICD-10-CM | POA: Diagnosis not present

## 2022-08-23 DIAGNOSIS — E1165 Type 2 diabetes mellitus with hyperglycemia: Secondary | ICD-10-CM | POA: Diagnosis not present

## 2022-08-23 DIAGNOSIS — I10 Essential (primary) hypertension: Secondary | ICD-10-CM | POA: Diagnosis not present

## 2022-08-23 DIAGNOSIS — I25119 Atherosclerotic heart disease of native coronary artery with unspecified angina pectoris: Secondary | ICD-10-CM | POA: Diagnosis not present

## 2022-08-23 DIAGNOSIS — F419 Anxiety disorder, unspecified: Secondary | ICD-10-CM | POA: Diagnosis not present

## 2022-08-23 DIAGNOSIS — Z794 Long term (current) use of insulin: Secondary | ICD-10-CM | POA: Diagnosis not present

## 2022-09-11 IMAGING — DX DG CHEST 1V PORT
1 series · 1 of 1 positions shown · non-contrast
Comparison: Chest CT 03/23/2019 and earlier.

CLINICAL DATA: 75-year-old female with chest pain.

EXAM:
PORTABLE CHEST 1 VIEW

[chest]
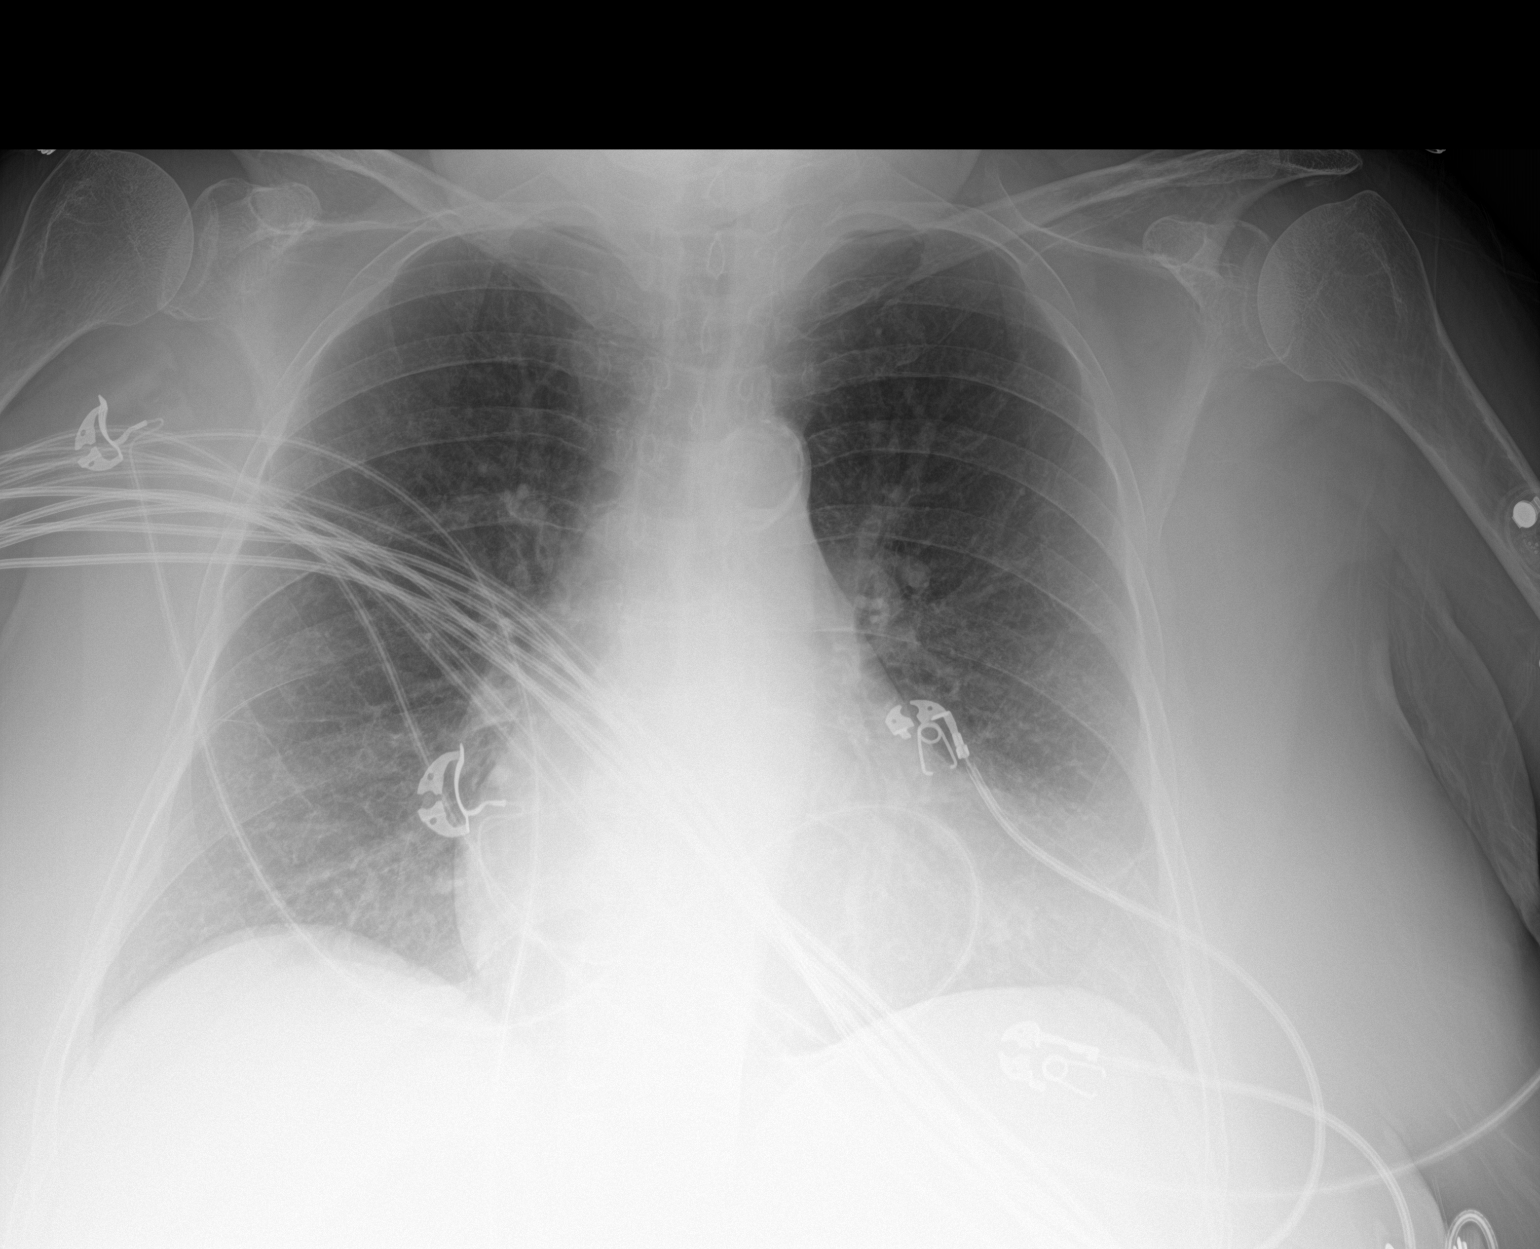

[1 of 1 positions shown; findings below may reference images not displayed]

FINDINGS: Portable AP upright view at 7373 hours. Calcified aortic
atherosclerosis. Lung volumes and mediastinal contours are within
normal limits when allowing for portable technique. Visualized
tracheal air column is within normal limits. Allowing for portable
technique the lungs are clear. No pneumothorax or pleural effusion.
No acute osseous abnormality identified.
IMPRESSION: No acute cardiopulmonary abnormality. Aortic Atherosclerosis
([id number]).

## 2022-09-21 ENCOUNTER — Encounter: Payer: Self-pay | Admitting: Neurology

## 2022-09-21 ENCOUNTER — Ambulatory Visit: Payer: Medicare PPO | Admitting: Neurology

## 2022-09-21 VITALS — BP 161/86 | HR 102 | Ht 61.0 in | Wt 148.5 lb

## 2022-09-21 DIAGNOSIS — M792 Neuralgia and neuritis, unspecified: Secondary | ICD-10-CM | POA: Diagnosis not present

## 2022-09-21 DIAGNOSIS — R799 Abnormal finding of blood chemistry, unspecified: Secondary | ICD-10-CM | POA: Diagnosis not present

## 2022-09-21 DIAGNOSIS — E1142 Type 2 diabetes mellitus with diabetic polyneuropathy: Secondary | ICD-10-CM | POA: Diagnosis not present

## 2022-09-21 DIAGNOSIS — R229 Localized swelling, mass and lump, unspecified: Secondary | ICD-10-CM | POA: Diagnosis not present

## 2022-09-21 DIAGNOSIS — R269 Unspecified abnormalities of gait and mobility: Secondary | ICD-10-CM

## 2022-09-21 DIAGNOSIS — R700 Elevated erythrocyte sedimentation rate: Secondary | ICD-10-CM | POA: Diagnosis not present

## 2022-09-21 DIAGNOSIS — R7989 Other specified abnormal findings of blood chemistry: Secondary | ICD-10-CM | POA: Diagnosis not present

## 2022-09-21 DIAGNOSIS — R7982 Elevated C-reactive protein (CRP): Secondary | ICD-10-CM | POA: Diagnosis not present

## 2022-09-21 DIAGNOSIS — E559 Vitamin D deficiency, unspecified: Secondary | ICD-10-CM | POA: Diagnosis not present

## 2022-09-21 DIAGNOSIS — R748 Abnormal levels of other serum enzymes: Secondary | ICD-10-CM | POA: Diagnosis not present

## 2022-09-21 DIAGNOSIS — Z794 Long term (current) use of insulin: Secondary | ICD-10-CM

## 2022-09-21 DIAGNOSIS — R7309 Other abnormal glucose: Secondary | ICD-10-CM | POA: Diagnosis not present

## 2022-09-21 MED ORDER — IMIPRAMINE HCL 25 MG PO TABS: 50.0000 mg | ORAL_TABLET | Freq: Every day | ORAL | 11 refills | Status: DC

## 2022-09-21 NOTE — Progress Notes (Signed)
Chief Complaint  Patient presents with   New Patient (Initial Visit)    Rm14, alone referral for neuropathy:burning tingly & numbness bilateral feet intermittent       ASSESSMENT AND PLAN  Sarah Francis is a 77 y.o. female   Peripheral neuropathy, most likely due to her poorly controlled diabetes, noncompliance with her medications, Neuropathic pain  Laboratory evaluation  Imipramine 25 mg titrating to 50 mg at bedtime for sleep,  Encouraged her to restart Cymbalta 60 mg daily for neuropathic pain  Return To Clinic With NP In 6 Months, if there is no significant worsening of her symptoms, may discharge to her primary care physician  DIAGNOSTIC DATA (LABS, IMAGING, TESTING) - I reviewed patient records, labs, notes, testing and imaging myself where available.   MEDICAL HISTORY:  Sarah Francis is a 77 year old female, seen in request by her primary care from [facility] Dr. Corliss Blacker, Toniann Fail, for evaluation of intermittent bilateral feet numbness tingling, gait abnormality, initial evaluation was on September 21, 2022  I reviewed and summarized the referring note. PMHX DM for 30 years HTN HLD Smoke  Alcohol 12 oz at least CAD Peripheral vascular disease  She had long history of diabetes, also alcohol abuse, noncompliant with her medications, she complains of gradual onset bilateral feet numbness tingling for more than 10 years, starting at the bottom of her feet, now extending to distal leg, sometimes very painful to the point of difficulty sleeping  Few months ago, she felt so tired of taking all those medications stopped all the medicine including her insulin, then was sent by emergency room by her primary care physician Jun 15 2022, with glucose level 499  She restarted on insulin, but still not taking other medication for her blood pressure, hyperlipidemia, significant peripheral vascular disease   PHYSICAL EXAM:   Vitals:   09/21/22 1531  BP: (!) 161/86  Pulse: (!) 102   Weight: 148 lb 8 oz (67.4 kg)  Height: 5\' 1"  (1.549 m)   Body mass index is 28.06 kg/m.  PHYSICAL EXAMNIATION:  Gen: NAD, conversant, well nourised, well groomed                     Cardiovascular: Regular rate rhythm, no peripheral edema, warm, nontender. Eyes: Conjunctivae clear without exudates or hemorrhage Neck: Supple, no carotid bruits. Pulmonary: Clear to auscultation bilaterally   NEUROLOGICAL EXAM:  MENTAL STATUS: Speech/cognition: Awake, alert, oriented to history taking and casual conversation CRANIAL NERVES: CN II: Visual fields are full to confrontation. Pupils are round equal and briskly reactive to light. CN III, IV, VI: extraocular movement are normal. No ptosis. CN V: Facial sensation is intact to light touch CN VII: Face is symmetric with normal eye closure  CN VIII: Hearing is normal to causal conversation. CN IX, X: Phonation is normal. CN XI: Head turning and shoulder shrug are intact  MOTOR: Mild to moderate bilateral ankle dorsiflexion weakness  REFLEXES: Reflexes are 1 and symmetric at the biceps, triceps, absent knees, and ankles. Plantar responses are flexor.  SENSORY: Length-dependent decreased light touch pinprick vibratory sensation to knee level  COORDINATION: There is no trunk or limb dysmetria noted.  GAIT/STANCE: Need push-up to get up from seated position, wide-based, unsteady, bilateral foot drop  REVIEW OF SYSTEMS:  Full 14 system review of systems performed and notable only for as above All other review of systems were negative.   ALLERGIES: Allergies  Allergen Reactions   Gabapentin     Other reaction(s): nausea/felt "  BAD"   Penicillin G     Other reaction(s): tongue swelling   Penicillins Rash    Did it involve swelling of the face/tongue/throat, SOB, or low BP? Yes Did it involve sudden or severe rash/hives, skin peeling, or any reaction on the inside of your mouth or nose? Unknown Did you need to seek medical attention  at a hospital or doctor's office? Unknown When did it last happen?    Pt was a child, had lip swelling   If all above answers are "NO", may proceed with cephalosporin use.     HOME MEDICATIONS: Current Outpatient Medications  Medication Sig Dispense Refill   HUMALOG KWIKPEN 100 UNIT/ML KwikPen Inject 12 Units into the skin in the morning and at bedtime.     insulin glargine (LANTUS SOLOSTAR) 100 UNIT/ML Solostar Pen Inject 30 Units into the skin 2 (two) times daily.     aspirin EC 81 MG tablet Take 81 mg by mouth daily. (Patient not taking: Reported on 09/21/2022)     atorvastatin (LIPITOR) 80 MG tablet TAKE 1 TABLET BY MOUTH EVERY DAY AT 6PM (Patient not taking: Reported on 09/21/2022) 30 tablet 9   cetirizine (ZYRTEC) 10 MG tablet Take 10 mg by mouth every morning. (Patient not taking: Reported on 09/21/2022)     cholecalciferol (VITAMIN D3) 25 MCG (1000 UT) tablet Take 2,000 Units by mouth daily. (Patient not taking: Reported on 09/21/2022)     cycloSPORINE (RESTASIS) 0.05 % ophthalmic emulsion Place 1 drop into both eyes 2 (two) times daily. (Patient not taking: Reported on 09/21/2022)     DULoxetine (CYMBALTA) 60 MG capsule Take 60 mg by mouth daily. (Patient not taking: Reported on 09/21/2022)     empagliflozin (JARDIANCE) 10 MG TABS tablet Take 10 mg by mouth daily. (Patient not taking: Reported on 09/21/2022)     lisinopril (ZESTRIL) 5 MG tablet Take 5 mg by mouth daily. (Patient not taking: Reported on 09/21/2022)     metoprolol tartrate (LOPRESSOR) 50 MG tablet TAKE 1 TABLET BY MOUTH TWICE A DAY (Patient not taking: Reported on 09/21/2022) 180 tablet 3   nitroGLYCERIN (NITROSTAT) 0.4 MG SL tablet PLACE 1 TABLET UNDER THE TONGUE EVERY FOR 3 DOSES AS NEEDED FOR CHEST PAIN (Patient not taking: Reported on 09/21/2022) 25 tablet 3   omeprazole (PRILOSEC) 20 MG capsule Take 20 mg by mouth daily. (Patient not taking: Reported on 09/21/2022)     ondansetron (ZOFRAN) 4 MG tablet Take 1 tablet (4 mg  total) by mouth every 6 (six) hours as needed for nausea. (Patient not taking: Reported on 09/21/2022) 20 tablet 0   ticagrelor (BRILINTA) 60 MG TABS tablet Take 1 tablet (60 mg total) by mouth 2 (two) times daily. (Patient not taking: Reported on 09/21/2022) 60 tablet 2   traZODone (DESYREL) 50 MG tablet Take 100-150 mg by mouth at bedtime. (Patient not taking: Reported on 09/21/2022)     vitamin B-12 (CYANOCOBALAMIN) 1000 MCG tablet Take 1,000 mcg by mouth daily. (Patient not taking: Reported on 09/21/2022)     No current facility-administered medications for this visit.    PAST MEDICAL HISTORY: Past Medical History:  Diagnosis Date   Anxiety    Aortic atherosclerosis (HCC)    CAD (coronary artery disease), native coronary artery    06/20/18 PCI/DES overlapping stents to p/m RCA, mild disease in LAD, and Lcx   Carotid artery stenosis    40-59% by dopplers 12/2018   Diabetes mellitus without complication (HCC)    GERD (gastroesophageal  reflux disease)    HLD (hyperlipidemia)    Hypertension    Major depression    initial diagnosis/medication around age 25   Osteoporosis    w h/o sacral fracture   PAD (peripheral artery disease) (HCC)    high grade stenosis in mid left SFA - followed by Dr. Allyson Sabal   Syncope 12/2018   felt to be vasovagal    PAST SURGICAL HISTORY: Past Surgical History:  Procedure Laterality Date   ABDOMINAL HYSTERECTOMY     CHOLECYSTECTOMY N/A 09/13/2020   Procedure: LAPAROSCOPIC CHOLECYSTECTOMY;  Surgeon: Axel Filler, MD;  Location: [facility] OR;  Service: General;  Laterality: N/A;   CORONARY STENT INTERVENTION N/A 06/20/2018   Procedure: CORONARY STENT INTERVENTION;  Surgeon: Tonny Bollman, MD;  Location: [facility] INVASIVE CV LAB;  Service: Cardiovascular;  Laterality: N/A;   KNEE SURGERY Right 2012   LEFT HEART CATH AND CORONARY ANGIOGRAPHY N/A 06/20/2018   Procedure: LEFT HEART CATH AND CORONARY ANGIOGRAPHY;  Surgeon: Tonny Bollman, MD;  Location: [facility] INVASIVE CV LAB;   Service: Cardiovascular;  Laterality: N/A;    FAMILY HISTORY: Family History  Problem Relation Age of Onset   Heart attack Mother    Heart disease Mother    AAA (abdominal aortic aneurysm) Father    Cancer Brother    Hypertension Sister    Diabetes Brother     SOCIAL HISTORY: Social History   Socioeconomic History   Marital status: Single    Spouse name: Not on file   Number of children: Not on file   Years of education: Not on file   Highest education level: Not on file  Occupational History   Not on file  Tobacco Use   Smoking status: Every Day    Current packs/day: 1.00    Average packs/day: 1 pack/day for 88.0 years (88.0 ttl pk-yrs)    Types: Cigarettes   Smokeless tobacco: Never   Tobacco comments:    Currently smoker 1ppd  Substance and Sexual Activity   Alcohol use: Yes    Comment: Occ now   Drug use: No   Sexual activity: Not Currently  Other Topics Concern   Not on file  Social History Narrative   Not on file   Social Determinants of Health   Financial Resource Strain: Not on file  Food Insecurity: Not on file  Transportation Needs: Not on file  Physical Activity: Not on file  Stress: Not on file  Social Connections: Not on file  Intimate Partner Violence: Not on file      Levert Feinstein, M.D. Ph.D.  [facility] [address], [address] [place], [place] [address] Ph: [phone] Fax: [phone]  CC:  Virgina Norfolk, DO [address] [place],  [place] [address]  Gweneth Dimitri, MD

## 2022-09-22 LAB — VITAMIN B12: Vitamin B-12: 563 pg/mL (ref 232–1245)

## 2022-09-27 LAB — TSH: TSH: 1.52 u[IU]/mL (ref 0.450–4.500)

## 2022-09-27 LAB — CBC WITH DIFFERENTIAL/PLATELET
Basophils Absolute: 0 10*3/uL (ref 0.0–0.2)
Basos: 1 %
EOS (ABSOLUTE): 0.1 10*3/uL (ref 0.0–0.4)
Eos: 1 %
Hematocrit: 41.1 % (ref 34.0–46.6)
Hemoglobin: 14.1 g/dL (ref 11.1–15.9)
Immature Grans (Abs): 0 10*3/uL (ref 0.0–0.1)
Immature Granulocytes: 0 %
Lymphocytes Absolute: 1.1 10*3/uL (ref 0.7–3.1)
Lymphs: 14 %
MCH: 33.5 pg — ABNORMAL HIGH (ref 26.6–33.0)
MCHC: 34.3 g/dL (ref 31.5–35.7)
MCV: 98 fL — ABNORMAL HIGH (ref 79–97)
Monocytes Absolute: 0.8 10*3/uL (ref 0.1–0.9)
Monocytes: 10 %
Neutrophils Absolute: 6.3 10*3/uL (ref 1.4–7.0)
Neutrophils: 74 %
Platelets: 257 10*3/uL (ref 150–450)
RBC: 4.21 x10E6/uL (ref 3.77–5.28)
RDW: 11.7 % (ref 11.7–15.4)
WBC: 8.3 10*3/uL (ref 3.4–10.8)

## 2022-09-27 LAB — CK: Total CK: 63 U/L (ref 32–182)

## 2022-09-27 LAB — COMPREHENSIVE METABOLIC PANEL
ALT: 18 IU/L (ref 0–32)
AST: 14 IU/L (ref 0–40)
Albumin: 4.1 g/dL (ref 3.8–4.8)
Alkaline Phosphatase: 112 IU/L (ref 44–121)
BUN/Creatinine Ratio: 16 (ref 12–28)
BUN: 12 mg/dL (ref 8–27)
Bilirubin Total: 0.5 mg/dL (ref 0.0–1.2)
CO2: 24 mmol/L (ref 20–29)
Calcium: 9.7 mg/dL (ref 8.7–10.3)
Chloride: 96 mmol/L (ref 96–106)
Creatinine, Ser: 0.73 mg/dL (ref 0.57–1.00)
Globulin, Total: 2.5 g/dL (ref 1.5–4.5)
Glucose: 163 mg/dL — ABNORMAL HIGH (ref 70–99)
Potassium: 4.5 mmol/L (ref 3.5–5.2)
Sodium: 134 mmol/L (ref 134–144)
Total Protein: 6.6 g/dL (ref 6.0–8.5)
eGFR: 86 mL/min/{1.73_m2} (ref >59)

## 2022-09-27 LAB — MULTIPLE MYELOMA PANEL, SERUM
Albumin SerPl Elph-Mcnc: 3.5 g/dL (ref 2.9–4.4)
Albumin/Glob SerPl: 1.2 (ref 0.7–1.7)
Alpha 1: 0.2 g/dL (ref 0.0–0.4)
Alpha2 Glob SerPl Elph-Mcnc: 0.9 g/dL (ref 0.4–1.0)
B-Globulin SerPl Elph-Mcnc: 1.1 g/dL (ref 0.7–1.3)
Gamma Glob SerPl Elph-Mcnc: 0.8 g/dL (ref 0.4–1.8)
Globulin, Total: 3.1 g/dL (ref 2.2–3.9)
IgA/Immunoglobulin A, Serum: 286 mg/dL (ref 64–422)
IgG (Immunoglobin G), Serum: 731 mg/dL (ref 586–1602)
IgM (Immunoglobulin M), Srm: 97 mg/dL (ref 26–217)

## 2022-09-27 LAB — FOLATE: Folate: 10.2 ng/mL (ref >3.0)

## 2022-09-27 LAB — VITAMIN D 25 HYDROXY (VIT D DEFICIENCY, FRACTURES): Vit D, 25-Hydroxy: 26.2 ng/mL — ABNORMAL LOW (ref 30.0–100.0)

## 2022-09-27 LAB — SEDIMENTATION RATE: Sed Rate: 9 mm/h (ref 0–40)

## 2022-09-27 LAB — C-REACTIVE PROTEIN: CRP: 2 mg/L (ref 0–10)

## 2022-09-27 LAB — ANA W/REFLEX IF POSITIVE: Anti Nuclear Antibody (ANA): NEGATIVE

## 2022-09-27 LAB — HGB A1C W/O EAG: Hgb A1c MFr Bld: 8 % — ABNORMAL HIGH (ref 4.8–5.6)

## 2022-09-27 LAB — RPR: RPR Ser Ql: NONREACTIVE

## 2022-10-05 DIAGNOSIS — E1142 Type 2 diabetes mellitus with diabetic polyneuropathy: Secondary | ICD-10-CM | POA: Diagnosis not present

## 2022-10-05 DIAGNOSIS — E1165 Type 2 diabetes mellitus with hyperglycemia: Secondary | ICD-10-CM | POA: Diagnosis not present

## 2022-10-14 ENCOUNTER — Other Ambulatory Visit: Payer: Self-pay | Admitting: Neurology

## 2022-10-19 ENCOUNTER — Emergency Department (HOSPITAL_BASED_OUTPATIENT_CLINIC_OR_DEPARTMENT_OTHER)
Admission: EM | Admit: 2022-10-19 | Discharge: 2022-10-19 | Disposition: A | Discharge status: Home / Self Care | Payer: Medicare PPO | Attending: Emergency Medicine | Admitting: Emergency Medicine

## 2022-10-19 ENCOUNTER — Emergency Department (HOSPITAL_BASED_OUTPATIENT_CLINIC_OR_DEPARTMENT_OTHER): Payer: Medicare PPO

## 2022-10-19 ENCOUNTER — Encounter (HOSPITAL_BASED_OUTPATIENT_CLINIC_OR_DEPARTMENT_OTHER): Payer: Self-pay | Admitting: Emergency Medicine

## 2022-10-19 DIAGNOSIS — M545 Low back pain, unspecified: Secondary | ICD-10-CM | POA: Diagnosis not present

## 2022-10-19 DIAGNOSIS — K449 Diaphragmatic hernia without obstruction or gangrene: Secondary | ICD-10-CM | POA: Diagnosis not present

## 2022-10-19 DIAGNOSIS — Z79899 Other long term (current) drug therapy: Secondary | ICD-10-CM | POA: Insufficient documentation

## 2022-10-19 DIAGNOSIS — M5441 Lumbago with sciatica, right side: Secondary | ICD-10-CM

## 2022-10-19 DIAGNOSIS — I10 Essential (primary) hypertension: Secondary | ICD-10-CM | POA: Insufficient documentation

## 2022-10-19 DIAGNOSIS — M5442 Lumbago with sciatica, left side: Secondary | ICD-10-CM | POA: Diagnosis not present

## 2022-10-19 DIAGNOSIS — R110 Nausea: Secondary | ICD-10-CM | POA: Diagnosis not present

## 2022-10-19 DIAGNOSIS — E1151 Type 2 diabetes mellitus with diabetic peripheral angiopathy without gangrene: Secondary | ICD-10-CM | POA: Diagnosis not present

## 2022-10-19 DIAGNOSIS — I70201 Unspecified atherosclerosis of native arteries of extremities, right leg: Secondary | ICD-10-CM | POA: Diagnosis not present

## 2022-10-19 DIAGNOSIS — K5730 Diverticulosis of large intestine without perforation or abscess without bleeding: Secondary | ICD-10-CM | POA: Diagnosis not present

## 2022-10-19 DIAGNOSIS — E119 Type 2 diabetes mellitus without complications: Secondary | ICD-10-CM | POA: Insufficient documentation

## 2022-10-19 DIAGNOSIS — M47816 Spondylosis without myelopathy or radiculopathy, lumbar region: Secondary | ICD-10-CM | POA: Diagnosis not present

## 2022-10-19 LAB — URINALYSIS, ROUTINE W REFLEX MICROSCOPIC
Bilirubin Urine: NEGATIVE
Glucose, UA: NEGATIVE mg/dL
Hgb urine dipstick: NEGATIVE
Ketones, ur: NEGATIVE mg/dL
Leukocytes,Ua: NEGATIVE
Nitrite: NEGATIVE
Protein, ur: NEGATIVE mg/dL
Specific Gravity, Urine: 1.034 — ABNORMAL HIGH (ref 1.005–1.030)
pH: 7 (ref 5.0–8.0)

## 2022-10-19 LAB — COMPREHENSIVE METABOLIC PANEL
ALT: 17 U/L (ref 0–44)
AST: 16 U/L (ref 15–41)
Albumin: 4.2 g/dL (ref 3.5–5.0)
Alkaline Phosphatase: 89 U/L (ref 38–126)
Anion gap: 10 (ref 5–15)
BUN: 14 mg/dL (ref 8–23)
CO2: 27 mmol/L (ref 22–32)
Calcium: 9.6 mg/dL (ref 8.9–10.3)
Chloride: 94 mmol/L — ABNORMAL LOW (ref 98–111)
Creatinine, Ser: 0.82 mg/dL (ref 0.44–1.00)
GFR, Estimated: >60 mL/min (ref >60)
Glucose, Bld: 197 mg/dL — ABNORMAL HIGH (ref 70–99)
Potassium: 4.8 mmol/L (ref 3.5–5.1)
Sodium: 131 mmol/L — ABNORMAL LOW (ref 135–145)
Total Bilirubin: 0.5 mg/dL (ref 0.3–1.2)
Total Protein: 7.1 g/dL (ref 6.5–8.1)

## 2022-10-19 LAB — CBC
HCT: 42.3 % (ref 36.0–46.0)
Hemoglobin: 14.9 g/dL (ref 12.0–15.0)
MCH: 32.5 pg (ref 26.0–34.0)
MCHC: 35.2 g/dL (ref 30.0–36.0)
MCV: 92.2 fL (ref 80.0–100.0)
Platelets: 272 10*3/uL (ref 150–400)
RBC: 4.59 MIL/uL (ref 3.87–5.11)
RDW: 11.9 % (ref 11.5–15.5)
WBC: 13.4 10*3/uL — ABNORMAL HIGH (ref 4.0–10.5)
nRBC: 0 % (ref 0.0–0.2)

## 2022-10-19 LAB — LIPASE, BLOOD: Lipase: <10 U/L — ABNORMAL LOW (ref 11–51)

## 2022-10-19 MED ORDER — OXYCODONE-ACETAMINOPHEN 5-325 MG PO TABS: 1.0000 | ORAL_TABLET | Freq: Four times a day (QID) | ORAL | 0 refills | Status: DC | PRN

## 2022-10-19 MED ORDER — MORPHINE SULFATE (PF) 4 MG/ML IV SOLN
4.0000 mg | Freq: Once | INTRAVENOUS | Status: AC
  Administered 2022-10-19: 4 mg via INTRAVENOUS
  Filled 2022-10-19: qty 1

## 2022-10-19 MED ORDER — ONDANSETRON HCL 4 MG/2ML IJ SOLN
4.0000 mg | Freq: Once | INTRAMUSCULAR | Status: AC
  Administered 2022-10-19: 4 mg via INTRAVENOUS
  Filled 2022-10-19: qty 2

## 2022-10-19 MED ORDER — IOHEXOL 350 MG/ML SOLN
100.0000 mL | Freq: Once | INTRAVENOUS | Status: AC | PRN
  Administered 2022-10-19: 100 mL via INTRAVENOUS

## 2022-10-19 MED ORDER — OXYCODONE-ACETAMINOPHEN 5-325 MG PO TABS
2.0000 | ORAL_TABLET | Freq: Once | ORAL | Status: AC
  Administered 2022-10-19: 2 via ORAL
  Filled 2022-10-19: qty 2

## 2022-10-19 MED ORDER — OXYCODONE-ACETAMINOPHEN 5-325 MG PO TABS: 1.0000 | ORAL_TABLET | Freq: Four times a day (QID) | ORAL | 0 refills | Status: AC | PRN

## 2022-10-19 NOTE — Discharge Instructions (Addendum)
You have a hiatal hernia that can be contributing to your pain.  I have ordered Percocet as needed for pain  As we discussed, your CT scan otherwise were unremarkable.  You have some incidental findings that were chronic.  If you have persistent back pain, I recommend that you get outpatient MRI with your doctor  Follow-up with your doctor and with general surgery regarding your hiatal hernia  Return to ER if you have severe abdominal pain or back pain or trouble walking or incontinence

## 2022-10-19 NOTE — ED Triage Notes (Signed)
Rightside pain x 3 weeks  Mid to lower back pain started today, nausea

## 2022-10-19 NOTE — ED Provider Notes (Signed)
[place] EMERGENCY DEPARTMENT AT [facility] Provider Note   CSN: [id number] Arrival date & time: 10/19/22  1843     History  Chief Complaint  Patient presents with   Back Pain    Sarah Francis is a 77 y.o. female history of diabetes, hypertension, here presenting with abdominal pain and back pain.  Patient has been having right-sided abdominal pain for the last 3 weeks.  Patient had acute onset of lower back pain today.  Patient also felt nauseated.  Denies any leg pain.  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  Denies any fall or injury.  The history is provided by the patient.       Home Medications Prior to Admission medications   Medication Sig Start Date End Date Taking? Authorizing Provider  aspirin EC 81 MG tablet Take 81 mg by mouth daily. Patient not taking: Reported on 09/21/2022    [provider]  atorvastatin (LIPITOR) 80 MG tablet TAKE 1 TABLET BY MOUTH EVERY DAY AT 6PM Patient not taking: Reported on 09/21/2022 11/15/18   Quintella Reichert, MD  cetirizine (ZYRTEC) 10 MG tablet Take 10 mg by mouth every morning. Patient not taking: Reported on 09/21/2022    [provider]  cholecalciferol (VITAMIN D3) 25 MCG (1000 UT) tablet Take 2,000 Units by mouth daily. Patient not taking: Reported on 09/21/2022    [provider]  cycloSPORINE (RESTASIS) 0.05 % ophthalmic emulsion Place 1 drop into both eyes 2 (two) times daily. Patient not taking: Reported on 09/21/2022    [provider]  DULoxetine (CYMBALTA) 60 MG capsule Take 60 mg by mouth daily. Patient not taking: Reported on 09/21/2022 10/30/18   [provider]  empagliflozin (JARDIANCE) 10 MG TABS tablet Take 10 mg by mouth daily. Patient not taking: Reported on 09/21/2022 07/23/21   [provider]  HUMALOG KWIKPEN 100 UNIT/ML KwikPen Inject 12 Units into the skin in the morning and at bedtime. 06/13/19   [provider]   imipramine (TOFRANIL) 25 MG tablet TAKE 2 TABLETS BY MOUTH AT BEDTIME. 10/14/22   Levert Feinstein, MD  insulin glargine (LANTUS SOLOSTAR) 100 UNIT/ML Solostar Pen Inject 30 Units into the skin 2 (two) times daily.    [provider]  lisinopril (ZESTRIL) 5 MG tablet Take 5 mg by mouth daily. Patient not taking: Reported on 09/21/2022 10/26/19   [provider]  metoprolol tartrate (LOPRESSOR) 50 MG tablet TAKE 1 TABLET BY MOUTH TWICE A DAY Patient not taking: Reported on 09/21/2022 09/18/19   Quintella Reichert, MD  nitroGLYCERIN (NITROSTAT) 0.4 MG SL tablet PLACE 1 TABLET UNDER THE TONGUE EVERY FOR 3 DOSES AS NEEDED FOR CHEST PAIN Patient not taking: Reported on 09/21/2022 09/08/21   Quintella Reichert, MD  omeprazole (PRILOSEC) 20 MG capsule Take 20 mg by mouth daily. Patient not taking: Reported on 09/21/2022    [provider]  ondansetron (ZOFRAN) 4 MG tablet Take 1 tablet (4 mg total) by mouth every 6 (six) hours as needed for nausea. Patient not taking: Reported on 09/21/2022 09/14/20   Myrtie Neither, MD  ticagrelor (BRILINTA) 60 MG TABS tablet Take 1 tablet (60 mg total) by mouth 2 (two) times daily. Patient not taking: Reported on 09/21/2022 06/19/21   Quintella Reichert, MD  traZODone (DESYREL) 50 MG tablet Take 100-150 mg by mouth at bedtime. Patient not taking: Reported on 09/21/2022 07/26/20   [provider]  vitamin B-12 (CYANOCOBALAMIN) 1000 MCG tablet  Take 1,000 mcg by mouth daily. Patient not taking: Reported on 09/21/2022    [provider]      Allergies    Gabapentin, Penicillin g, and Penicillins    Review of Systems   Review of Systems  Gastrointestinal:  Positive for abdominal pain and vomiting.  Musculoskeletal:  Positive for back pain.  All other systems reviewed and are negative.   Physical Exam Updated Vital Signs BP 125/82   Pulse 97   Temp 97.8 F (36.6 C) (Oral)   Resp 20   SpO2 93%  Physical Exam Vitals and nursing note  reviewed.  Constitutional:      Comments: Uncomfortable  HENT:     Head: Normocephalic.     Nose: Nose normal.     Mouth/Throat:     Mouth: Mucous membranes are dry.  Eyes:     Extraocular Movements: Extraocular movements intact.     Pupils: Pupils are equal, round, and reactive to light.  Cardiovascular:     Rate and Rhythm: Normal rate and regular rhythm.     Pulses: Normal pulses.     Heart sounds: Normal heart sounds.  Pulmonary:     Effort: Pulmonary effort is normal.     Breath sounds: Normal breath sounds.  Abdominal:     Comments: Mild epigastric tenderness.  No obvious pulsatile mass.  Patient has bilateral lower lumbar tenderness  Musculoskeletal:     Cervical back: Normal range of motion and neck supple.     Comments: Bilateral lower lumbar tenderness.  No obvious deformity.  Patient has positive straight leg raise bilaterally.  Patient has normal pulses bilateral femoral area  Skin:    General: Skin is warm.     Capillary Refill: Capillary refill takes less than 2 seconds.  Neurological:     General: No focal deficit present.     Mental Status: She is oriented to person, place, and time.     Comments: Positive straight leg raise bilaterally.  No saddle anesthesia  Psychiatric:        Mood and Affect: Mood normal.        Behavior: Behavior normal.     ED Results / Procedures / Treatments   Labs (all labs ordered are listed, but only abnormal results are displayed) Labs Reviewed  LIPASE, BLOOD  COMPREHENSIVE METABOLIC PANEL  CBC  URINALYSIS, ROUTINE W REFLEX MICROSCOPIC    EKG None  Radiology No results found.  Procedures Procedures    Medications Ordered in ED Medications  ondansetron (ZOFRAN) injection 4 mg (4 mg Intravenous Given 10/19/22 1921)  morphine (PF) 4 MG/ML injection 4 mg (4 mg Intravenous Given 10/19/22 1921)    ED Course/ Medical Decision Making/ A&P                                 Medical Decision Making Sarah Francis is a 77  y.o. female here presenting with abdominal pain and back pain.  Concern for possible AAA versus lumbar radiculopathy versus small bowel obstruction.  Plan to get CBC and CMP and CTA of the abdomen pelvis and CT lumbar spine.  Will give pain medicine and reassess  10:33 PM I reviewed patient's labs and independently reviewed imaging studies.  Labs unremarkable.  UA is unremarkable.  CT abdomen pelvis showed hiatal hernia.  Patient CT lumbar spine did not show any acute findings.  Patient is neurologically intact currently.  Wonder if her pain  is from hiatal hernia.  Patient is already on PPI.  Will refer to general surgery outpatient.  She has no saddle anesthesia does not need MRI currently.  I recommend that if she has persistent pain that she gets MRI of the lumbar spine outpatient.  Gave strict return precautions  Problems Addressed: Acute midline low back pain with bilateral sciatica: acute illness or injury Hiatal hernia: acute illness or injury  Amount and/or Complexity of Data Reviewed Labs: ordered. Decision-making details documented in ED Course. Radiology: ordered and independent interpretation performed. Decision-making details documented in ED Course.  Risk Prescription drug management.  Final Clinical Impression(s) / ED Diagnoses Final diagnoses:  None    Rx / DC Orders ED Discharge Orders     None         Charlynne Pander, MD 10/19/22 2234

## 2022-10-19 NOTE — ED Notes (Signed)
Pt in bed, call light w/in reach, bed in lowest position, no additional needs at this time.

## 2022-10-19 NOTE — ED Notes (Signed)
Out to CT

## 2022-10-19 NOTE — ED Notes (Signed)
Bedside toilet at bedside, assisted pt to bedside toilet,pt has no complaints.  family in rm w pt on toilet, will ring call bell when finished.

## 2022-10-21 ENCOUNTER — Other Ambulatory Visit: Payer: Self-pay

## 2022-10-21 ENCOUNTER — Encounter (HOSPITAL_BASED_OUTPATIENT_CLINIC_OR_DEPARTMENT_OTHER): Payer: Self-pay

## 2022-10-21 ENCOUNTER — Emergency Department (HOSPITAL_BASED_OUTPATIENT_CLINIC_OR_DEPARTMENT_OTHER)
Admission: EM | Admit: 2022-10-21 | Discharge: 2022-10-21 | Disposition: A | Discharge status: Home / Self Care | Payer: Medicare PPO | Attending: Emergency Medicine | Admitting: Emergency Medicine

## 2022-10-21 DIAGNOSIS — E119 Type 2 diabetes mellitus without complications: Secondary | ICD-10-CM | POA: Insufficient documentation

## 2022-10-21 DIAGNOSIS — I2510 Atherosclerotic heart disease of native coronary artery without angina pectoris: Secondary | ICD-10-CM | POA: Diagnosis not present

## 2022-10-21 DIAGNOSIS — Z79899 Other long term (current) drug therapy: Secondary | ICD-10-CM | POA: Diagnosis not present

## 2022-10-21 DIAGNOSIS — Z7982 Long term (current) use of aspirin: Secondary | ICD-10-CM | POA: Insufficient documentation

## 2022-10-21 DIAGNOSIS — Z794 Long term (current) use of insulin: Secondary | ICD-10-CM | POA: Insufficient documentation

## 2022-10-21 DIAGNOSIS — M545 Low back pain, unspecified: Secondary | ICD-10-CM | POA: Insufficient documentation

## 2022-10-21 DIAGNOSIS — I10 Essential (primary) hypertension: Secondary | ICD-10-CM | POA: Diagnosis not present

## 2022-10-21 DIAGNOSIS — M549 Dorsalgia, unspecified: Secondary | ICD-10-CM | POA: Diagnosis present

## 2022-10-21 DIAGNOSIS — M5459 Other low back pain: Secondary | ICD-10-CM | POA: Diagnosis not present

## 2022-10-21 MED ORDER — LIDOCAINE 5 % EX PTCH: 1.0000 | MEDICATED_PATCH | CUTANEOUS | 0 refills | Status: AC

## 2022-10-21 MED ORDER — OXYCODONE-ACETAMINOPHEN 5-325 MG PO TABS: 1.0000 | ORAL_TABLET | Freq: Four times a day (QID) | ORAL | 0 refills | Status: AC | PRN

## 2022-10-21 MED ORDER — LIDOCAINE 5 % EX PTCH
1.0000 | MEDICATED_PATCH | CUTANEOUS | Status: DC
  Administered 2022-10-21: 1 via TRANSDERMAL
  Filled 2022-10-21: qty 1

## 2022-10-21 MED ORDER — PROMETHAZINE HCL 25 MG RE SUPP: 25.0000 mg | Freq: Four times a day (QID) | RECTAL | 0 refills | Status: AC | PRN

## 2022-10-21 NOTE — Discharge Instructions (Signed)
Please follow-up with your primary care provider in regards to recent ER visit.  Today had a reassuring physical exam however you need to speak with your primary care provider about outpatient lumbar MRI to further evaluate for your back pain.  Your back pain is most likely musculoskeletal and you may use Tylenol every 6 hours as needed for pain.  Pain not controlled by the pain you may use Percocet I prescribed for you.  You may use heat and the lidocaine patches as well.  If symptoms change or worsen please return to ER.

## 2022-10-21 NOTE — ED Triage Notes (Signed)
Pt c/o continued back pain. Seen Tuesday for same- "not as extreme as it was Tuesday, but it's getting there." Last med 1p- "that relieved it until about ago
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
## 2022-10-21 NOTE — ED Notes (Addendum)
Reviewed discharge instructions, medications, and home care with pt and daughter. Pt verbalized understanding and had no further questions. Pt exited ED without complications.

## 2022-10-28 DIAGNOSIS — M810 Age-related osteoporosis without current pathological fracture: Secondary | ICD-10-CM | POA: Diagnosis not present

## 2022-10-28 DIAGNOSIS — I10 Essential (primary) hypertension: Secondary | ICD-10-CM | POA: Diagnosis not present

## 2022-10-28 DIAGNOSIS — G8929 Other chronic pain: Secondary | ICD-10-CM | POA: Diagnosis not present

## 2022-10-28 DIAGNOSIS — Z23 Encounter for immunization: Secondary | ICD-10-CM | POA: Diagnosis not present

## 2022-10-28 DIAGNOSIS — F17200 Nicotine dependence, unspecified, uncomplicated: Secondary | ICD-10-CM | POA: Diagnosis not present

## 2022-10-28 DIAGNOSIS — M549 Dorsalgia, unspecified: Secondary | ICD-10-CM | POA: Diagnosis not present

## 2022-10-28 DIAGNOSIS — Z6827 Body mass index (BMI) 27.0-27.9, adult: Secondary | ICD-10-CM | POA: Diagnosis not present

## 2022-11-05 DIAGNOSIS — E1142 Type 2 diabetes mellitus with diabetic polyneuropathy: Secondary | ICD-10-CM | POA: Diagnosis not present

## 2022-11-05 DIAGNOSIS — E1165 Type 2 diabetes mellitus with hyperglycemia: Secondary | ICD-10-CM | POA: Diagnosis not present

## 2022-11-08 ENCOUNTER — Other Ambulatory Visit: Payer: Self-pay | Admitting: *Deleted

## 2022-11-08 DIAGNOSIS — I739 Peripheral vascular disease, unspecified: Secondary | ICD-10-CM

## 2022-11-08 NOTE — Progress Notes (Unsigned)
[facility] [place]  ASSESSMENT / PLAN: Sarah Francis is a 77 y.o. female with atherosclerosis of native arteries of bilateral lower extremities causing no symptoms. CT angiogram of abdomen and pelvis has no vascular abnormalities that can explain her chronic right upper quadrant pain.   Recommend:  Abstinence from all tobacco products. Blood glucose control with goal A1c < 7%. Blood pressure control with goal blood pressure < 140/90 mmHg. Lipid reduction therapy with goal LDL-C <100 mg/dL  Aspirin 81mg  PO QD.  Atorvastatin 40-80mg  PO QD (or other "high intensity" statin therapy).  Follow up if patient develops any symptoms of peripheral arterial disease.   CHIEF COMPLAINT: CT results  HISTORY OF PRESENT ILLNESS: Sarah Francis is a 77 y.o. female referred to clinic by [facility], ER for evaluation of CT scan abnormalities.  The patient reported to the ER with severe back and abdominal pain.  Her abdominal pain has persisted.  She is frustrated by this.  She has had a cholecystectomy.  Her pain is in her right upper quadrant.  It is not related to eating food.  She has not had any unintentional weight loss.  She is in a wheelchair, and does not walk fast or far enough to claudicate.  She denies any rest pain in her legs.  Has no ulcers about her feet.   Past Medical History:  Diagnosis Date   Anxiety    Aortic atherosclerosis (HCC)    CAD (coronary artery disease), native coronary artery    06/20/18 PCI/DES overlapping stents to p/m RCA, mild disease in LAD, and Lcx   Carotid artery stenosis    40-59% by dopplers 12/2018   Diabetes mellitus without complication (HCC)    GERD (gastroesophageal reflux disease)    HLD (hyperlipidemia)    Hypertension    Major depression    initial diagnosis/medication around age 77   Osteoporosis    w h/o sacral fracture   PAD (peripheral artery disease) (HCC)    high grade stenosis in mid left SFA - followed by Dr. Allyson Sabal    Syncope 12/2018   felt to be vasovagal    Past Surgical History:  Procedure Laterality Date   ABDOMINAL HYSTERECTOMY     CHOLECYSTECTOMY N/A 09/13/2020   Procedure: LAPAROSCOPIC CHOLECYSTECTOMY;  Surgeon: Axel Filler, MD;  Location: [facility] OR;  Service: General;  Laterality: N/A;   CORONARY STENT INTERVENTION N/A 06/20/2018   Procedure: CORONARY STENT INTERVENTION;  Surgeon: Tonny Bollman, MD;  Location: [facility] INVASIVE CV LAB;  Service: Cardiovascular;  Laterality: N/A;   KNEE SURGERY Right 2012   LEFT HEART CATH AND CORONARY ANGIOGRAPHY N/A 06/20/2018   Procedure: LEFT HEART CATH AND CORONARY ANGIOGRAPHY;  Surgeon: Tonny Bollman, MD;  Location: [facility] INVASIVE CV LAB;  Service: Cardiovascular;  Laterality: N/A;    Family History  Problem Relation Age of Onset   Heart attack Mother    Heart disease Mother    AAA (abdominal aortic aneurysm) Father    Cancer Brother    Hypertension Sister    Diabetes Brother     Social History   Socioeconomic History   Marital status: Single    Spouse name: Not on file   Number of children: Not on file   Years of education: Not on file   Highest education level: Not on file  Occupational History   Not on file  Tobacco Use   Smoking status: Every Day    Current packs/day: 1.00    Average packs/day: 1 pack/day  for 88.0 years (88.0 ttl pk-yrs)    Types: Cigarettes   Smokeless tobacco: Never   Tobacco comments:    Currently smoker 1ppd  Substance and Sexual Activity   Alcohol use: Yes    Comment: Occ now   Drug use: No   Sexual activity: Not Currently  Other Topics Concern   Not on file  Social History Narrative   Not on file   Social Determinants of Health   Financial Resource Strain: Not on file  Food Insecurity: Not on file  Transportation Needs: Not on file  Physical Activity: Not on file  Stress: Not on file  Social Connections: Not on file  Intimate Partner Violence: Not on file    Allergies  Allergen Reactions    Gabapentin     Other reaction(s): nausea/felt "BAD"   Penicillin G     Other reaction(s): tongue swelling   Penicillins Rash    Did it involve swelling of the face/tongue/throat, SOB, or low BP? Yes Did it involve sudden or severe rash/hives, skin peeling, or any reaction on the inside of your mouth or nose? Unknown Did you need to seek medical attention at a hospital or doctor's office? Unknown When did it last happen?    Pt was a child, had lip swelling   If all above answers are "NO", may proceed with cephalosporin use.     Current Outpatient Medications  Medication Sig Dispense Refill   aspirin EC 81 MG tablet Take 81 mg by mouth daily. (Patient not taking: Reported on 09/21/2022)     atorvastatin (LIPITOR) 80 MG tablet TAKE 1 TABLET BY MOUTH EVERY DAY AT 6PM (Patient not taking: Reported on 09/21/2022) 30 tablet 9   cetirizine (ZYRTEC) 10 MG tablet Take 10 mg by mouth every morning. (Patient not taking: Reported on 09/21/2022)     cholecalciferol (VITAMIN D3) 25 MCG (1000 UT) tablet Take 2,000 Units by mouth daily. (Patient not taking: Reported on 09/21/2022)     cycloSPORINE (RESTASIS) 0.05 % ophthalmic emulsion Place 1 drop into both eyes 2 (two) times daily. (Patient not taking: Reported on 09/21/2022)     DULoxetine (CYMBALTA) 60 MG capsule Take 60 mg by mouth daily. (Patient not taking: Reported on 09/21/2022)     empagliflozin (JARDIANCE) 10 MG TABS tablet Take 10 mg by mouth daily. (Patient not taking: Reported on 09/21/2022)     HUMALOG KWIKPEN 100 UNIT/ML KwikPen Inject 12 Units into the skin in the morning and at bedtime.     imipramine (TOFRANIL) 25 MG tablet TAKE 2 TABLETS BY MOUTH AT BEDTIME. 180 tablet 4   insulin glargine (LANTUS SOLOSTAR) 100 UNIT/ML Solostar Pen Inject 30 Units into the skin 2 (two) times daily.     lidocaine (LIDODERM) 5 % Place 1 patch onto the skin daily. Remove & Discard patch within 12 hours or as directed by MD 30 patch 0   lisinopril (ZESTRIL) 5 MG  tablet Take 5 mg by mouth daily. (Patient not taking: Reported on 09/21/2022)     metoprolol tartrate (LOPRESSOR) 50 MG tablet TAKE 1 TABLET BY MOUTH TWICE A DAY (Patient not taking: Reported on 09/21/2022) 180 tablet 3   nitroGLYCERIN (NITROSTAT) 0.4 MG SL tablet PLACE 1 TABLET UNDER THE TONGUE EVERY FOR 3 DOSES AS NEEDED FOR CHEST PAIN (Patient not taking: Reported on 09/21/2022) 25 tablet 3   omeprazole (PRILOSEC) 20 MG capsule Take 20 mg by mouth daily. (Patient not taking: Reported on 09/21/2022)     ondansetron [facility])  4 MG tablet Take 1 tablet (4 mg total) by mouth every 6 (six) hours as needed for nausea. (Patient not taking: Reported on 09/21/2022) 20 tablet 0   oxyCODONE-acetaminophen (PERCOCET) 5-325 MG tablet Take 1 tablet by mouth every 6 (six) hours as needed. 10 tablet 0   promethazine (PHENERGAN) 25 MG suppository Place 1 suppository (25 mg total) rectally every 6 (six) hours as needed for nausea or vomiting. 12 each 0   ticagrelor (BRILINTA) 60 MG TABS tablet Take 1 tablet (60 mg total) by mouth 2 (two) times daily. (Patient not taking: Reported on 09/21/2022) 60 tablet 2   traZODone (DESYREL) 50 MG tablet Take 100-150 mg by mouth at bedtime. (Patient not taking: Reported on 09/21/2022)     vitamin B-12 (CYANOCOBALAMIN) 1000 MCG tablet Take 1,000 mcg by mouth daily. (Patient not taking: Reported on 09/21/2022)     No current facility-administered medications for this visit.    PHYSICAL EXAM Vitals:   11/09/22 1536  BP: (!) 175/85  Pulse: 60  Temp: 98.3 F (36.8 C)  TempSrc: Temporal  SpO2: 96%  Weight: 145 lb 1.6 oz (65.8 kg)  Height: 5\' 1"  (1.549 m)    Elderly woman in no distress Regular rate and rhythm Unlabored breathing No palpable femoral pulses  PERTINENT LABORATORY AND RADIOLOGIC DATA  Most recent CBC    Latest Ref Rng & Units 10/19/2022    7:05 PM 09/21/2022    4:12 PM 06/15/2022    9:52 PM  CBC  WBC 4.0 - 10.5 K/uL 13.4  8.3    Hemoglobin 12.0 - 15.0  g/dL 47.8  29.5  62.1   Hematocrit 36.0 - 46.0 % 42.3  41.1  46.0   Platelets 150 - 400 K/uL 272  257       Most recent CMP    Latest Ref Rng & Units 10/19/2022    7:05 PM 09/21/2022    4:12 PM 06/15/2022    9:52 PM  CMP  Glucose 70 - 99 mg/dL 308  657    BUN 8 - 23 mg/dL 14  12    Creatinine 8.46 - 1.00 mg/dL 9.62  9.52    Sodium 841 - 145 mmol/L 131  134  130   Potassium 3.5 - 5.1 mmol/L 4.8  4.5  4.0   Chloride 98 - 111 mmol/L 94  96    CO2 22 - 32 mmol/L 27  24    Calcium 8.9 - 10.3 mg/dL 9.6  9.7    Total Protein 6.5 - 8.1 g/dL 7.1  6.6    Total Bilirubin 0.3 - 1.2 mg/dL 0.5  0.5    Alkaline Phos 38 - 126 U/L 89  112    AST 15 - 41 U/L 16  14    ALT 0 - 44 U/L 17  18      Renal function CrCl cannot be calculated (Unknown ideal weight.).  Hgb A1c MFr Bld (%)  Date Value  09/21/2022 8.0 (H)    LDL Cholesterol  Date Value Ref Range Status  06/17/2018 60 0 - 99 mg/dL Final    Comment:           Total Cholesterol/HDL:CHD Risk Coronary Heart Disease Risk Table                     Men   Women  1/2 Average Risk   3.4   3.3  Average Risk       5.0   4.4  2 X Average Risk   9.6   7.1  3 X Average Risk  23.4   11.0        Use the calculated Patient Ratio above and the CHD Risk Table to determine the patient's CHD Risk.        ATP III CLASSIFICATION (LDL):  <100     mg/dL   Optimal  161-096  mg/dL   Near or Above                    Optimal  130-159  mg/dL   Borderline  045-409  mg/dL   High  >811     mg/dL   Very High Performed at [facility], [address] [place], [place] [address]     CT angiogram 10/19/2022.  Personally reviewed.  Significant aortoiliac occlusive disease not causing occlusion.  No mesenteric artery stenosis.  No vascular findings to explain right upper quadrant pain or back pain.  Rande Brunt. Lenell Antu, MD FACS [facility] [place] Phone Number: [phone] 11/08/2022 11:48 AM   Total time spent on  preparing this encounter including chart review, data review, collecting history, examining the patient, coordinating care for this new patient, 60 minutes.  Portions of this report may have been transcribed using voice recognition software.  Every effort has been made to ensure accuracy; however, inadvertent computerized transcription errors may still be present.

## 2022-11-09 ENCOUNTER — Ambulatory Visit: Payer: Medicare PPO | Admitting: Vascular Surgery

## 2022-11-09 ENCOUNTER — Encounter: Payer: Self-pay | Admitting: Vascular Surgery

## 2022-11-09 ENCOUNTER — Ambulatory Visit (HOSPITAL_COMMUNITY)
Admission: RE | Admit: 2022-11-09 | Discharge: 2022-11-09 | Disposition: A | Discharge status: Home / Self Care | Payer: Medicare PPO | Source: Ambulatory Visit | Attending: Vascular Surgery | Admitting: Vascular Surgery

## 2022-11-09 VITALS — BP 175/85 | HR 60 | Temp 98.3°F | Ht 61.0 in | Wt 145.1 lb

## 2022-11-09 DIAGNOSIS — I739 Peripheral vascular disease, unspecified: Secondary | ICD-10-CM

## 2022-11-09 DIAGNOSIS — I7409 Other arterial embolism and thrombosis of abdominal aorta: Secondary | ICD-10-CM

## 2022-11-10 LAB — VAS US ABI WITH/WO TBI
Left ABI: 0.55
Right ABI: 0.58

## 2022-11-29 DIAGNOSIS — K219 Gastro-esophageal reflux disease without esophagitis: Secondary | ICD-10-CM | POA: Diagnosis not present

## 2022-11-29 DIAGNOSIS — I25119 Atherosclerotic heart disease of native coronary artery with unspecified angina pectoris: Secondary | ICD-10-CM | POA: Diagnosis not present

## 2022-11-29 DIAGNOSIS — I700 Atherosclerosis of aorta: Secondary | ICD-10-CM | POA: Diagnosis not present

## 2022-11-29 DIAGNOSIS — I10 Essential (primary) hypertension: Secondary | ICD-10-CM | POA: Diagnosis not present

## 2022-11-29 DIAGNOSIS — E1142 Type 2 diabetes mellitus with diabetic polyneuropathy: Secondary | ICD-10-CM | POA: Diagnosis not present

## 2022-11-29 DIAGNOSIS — E1165 Type 2 diabetes mellitus with hyperglycemia: Secondary | ICD-10-CM | POA: Diagnosis not present

## 2022-11-29 DIAGNOSIS — M545 Low back pain, unspecified: Secondary | ICD-10-CM | POA: Diagnosis not present

## 2022-11-29 DIAGNOSIS — E1151 Type 2 diabetes mellitus with diabetic peripheral angiopathy without gangrene: Secondary | ICD-10-CM | POA: Diagnosis not present

## 2022-11-29 DIAGNOSIS — R0781 Pleurodynia: Secondary | ICD-10-CM | POA: Diagnosis not present

## 2022-11-29 DIAGNOSIS — E782 Mixed hyperlipidemia: Secondary | ICD-10-CM | POA: Diagnosis not present

## 2022-11-29 DIAGNOSIS — Z794 Long term (current) use of insulin: Secondary | ICD-10-CM | POA: Diagnosis not present

## 2022-12-06 DIAGNOSIS — E1142 Type 2 diabetes mellitus with diabetic polyneuropathy: Secondary | ICD-10-CM | POA: Diagnosis not present

## 2022-12-06 DIAGNOSIS — E1165 Type 2 diabetes mellitus with hyperglycemia: Secondary | ICD-10-CM | POA: Diagnosis not present

## 2023-01-06 DIAGNOSIS — E1165 Type 2 diabetes mellitus with hyperglycemia: Secondary | ICD-10-CM | POA: Diagnosis not present

## 2023-01-06 DIAGNOSIS — E1142 Type 2 diabetes mellitus with diabetic polyneuropathy: Secondary | ICD-10-CM | POA: Diagnosis not present

## 2023-02-07 DIAGNOSIS — E1165 Type 2 diabetes mellitus with hyperglycemia: Secondary | ICD-10-CM | POA: Diagnosis not present

## 2023-02-07 DIAGNOSIS — E1142 Type 2 diabetes mellitus with diabetic polyneuropathy: Secondary | ICD-10-CM | POA: Diagnosis not present

## 2023-03-10 DIAGNOSIS — E1165 Type 2 diabetes mellitus with hyperglycemia: Secondary | ICD-10-CM | POA: Diagnosis not present

## 2023-03-10 DIAGNOSIS — E1142 Type 2 diabetes mellitus with diabetic polyneuropathy: Secondary | ICD-10-CM | POA: Diagnosis not present

## 2023-03-23 DIAGNOSIS — F1020 Alcohol dependence, uncomplicated: Secondary | ICD-10-CM | POA: Diagnosis not present

## 2023-03-23 DIAGNOSIS — E1165 Type 2 diabetes mellitus with hyperglycemia: Secondary | ICD-10-CM | POA: Diagnosis not present

## 2023-03-23 DIAGNOSIS — I25119 Atherosclerotic heart disease of native coronary artery with unspecified angina pectoris: Secondary | ICD-10-CM | POA: Diagnosis not present

## 2023-03-23 DIAGNOSIS — M810 Age-related osteoporosis without current pathological fracture: Secondary | ICD-10-CM | POA: Diagnosis not present

## 2023-03-23 DIAGNOSIS — E1142 Type 2 diabetes mellitus with diabetic polyneuropathy: Secondary | ICD-10-CM | POA: Diagnosis not present

## 2023-03-23 DIAGNOSIS — Z794 Long term (current) use of insulin: Secondary | ICD-10-CM | POA: Diagnosis not present

## 2023-03-23 DIAGNOSIS — I10 Essential (primary) hypertension: Secondary | ICD-10-CM | POA: Diagnosis not present

## 2023-03-23 DIAGNOSIS — K219 Gastro-esophageal reflux disease without esophagitis: Secondary | ICD-10-CM | POA: Diagnosis not present

## 2023-03-23 DIAGNOSIS — E782 Mixed hyperlipidemia: Secondary | ICD-10-CM | POA: Diagnosis not present

## 2023-04-06 ENCOUNTER — Ambulatory Visit: Payer: Medicare PPO | Admitting: Adult Health

## 2023-04-10 DIAGNOSIS — E1142 Type 2 diabetes mellitus with diabetic polyneuropathy: Secondary | ICD-10-CM | POA: Diagnosis not present

## 2023-04-10 DIAGNOSIS — E1165 Type 2 diabetes mellitus with hyperglycemia: Secondary | ICD-10-CM | POA: Diagnosis not present

## 2023-04-28 ENCOUNTER — Encounter: Payer: Self-pay | Admitting: Acute Care

## 2023-05-11 DIAGNOSIS — M810 Age-related osteoporosis without current pathological fracture: Secondary | ICD-10-CM | POA: Diagnosis not present

## 2023-05-11 DIAGNOSIS — E1142 Type 2 diabetes mellitus with diabetic polyneuropathy: Secondary | ICD-10-CM | POA: Diagnosis not present

## 2023-05-11 DIAGNOSIS — E1165 Type 2 diabetes mellitus with hyperglycemia: Secondary | ICD-10-CM | POA: Diagnosis not present

## 2023-06-11 DIAGNOSIS — E1142 Type 2 diabetes mellitus with diabetic polyneuropathy: Secondary | ICD-10-CM | POA: Diagnosis not present

## 2023-06-11 DIAGNOSIS — E1165 Type 2 diabetes mellitus with hyperglycemia: Secondary | ICD-10-CM | POA: Diagnosis not present

## 2023-06-29 DIAGNOSIS — L249 Irritant contact dermatitis, unspecified cause: Secondary | ICD-10-CM | POA: Diagnosis not present

## 2023-06-29 DIAGNOSIS — G63 Polyneuropathy in diseases classified elsewhere: Secondary | ICD-10-CM | POA: Diagnosis not present

## 2023-06-29 DIAGNOSIS — H01134 Eczematous dermatitis of left upper eyelid: Secondary | ICD-10-CM | POA: Diagnosis not present

## 2023-06-29 DIAGNOSIS — Z1231 Encounter for screening mammogram for malignant neoplasm of breast: Secondary | ICD-10-CM | POA: Diagnosis not present

## 2023-06-29 DIAGNOSIS — E1142 Type 2 diabetes mellitus with diabetic polyneuropathy: Secondary | ICD-10-CM | POA: Diagnosis not present

## 2023-06-29 DIAGNOSIS — I10 Essential (primary) hypertension: Secondary | ICD-10-CM | POA: Diagnosis not present

## 2023-06-29 DIAGNOSIS — E782 Mixed hyperlipidemia: Secondary | ICD-10-CM | POA: Diagnosis not present

## 2023-06-29 DIAGNOSIS — E1165 Type 2 diabetes mellitus with hyperglycemia: Secondary | ICD-10-CM | POA: Diagnosis not present

## 2023-06-29 DIAGNOSIS — B372 Candidiasis of skin and nail: Secondary | ICD-10-CM | POA: Diagnosis not present

## 2023-07-08 DIAGNOSIS — E1142 Type 2 diabetes mellitus with diabetic polyneuropathy: Secondary | ICD-10-CM | POA: Diagnosis not present

## 2023-07-08 DIAGNOSIS — E1165 Type 2 diabetes mellitus with hyperglycemia: Secondary | ICD-10-CM | POA: Diagnosis not present

## 2023-07-08 DIAGNOSIS — I10 Essential (primary) hypertension: Secondary | ICD-10-CM | POA: Diagnosis not present

## 2023-07-08 DIAGNOSIS — I739 Peripheral vascular disease, unspecified: Secondary | ICD-10-CM | POA: Diagnosis not present

## 2023-07-12 DIAGNOSIS — E1142 Type 2 diabetes mellitus with diabetic polyneuropathy: Secondary | ICD-10-CM | POA: Diagnosis not present

## 2023-07-12 DIAGNOSIS — E1165 Type 2 diabetes mellitus with hyperglycemia: Secondary | ICD-10-CM | POA: Diagnosis not present

## 2023-07-25 DIAGNOSIS — I10 Essential (primary) hypertension: Secondary | ICD-10-CM | POA: Diagnosis not present

## 2023-07-25 DIAGNOSIS — I25119 Atherosclerotic heart disease of native coronary artery with unspecified angina pectoris: Secondary | ICD-10-CM | POA: Diagnosis not present

## 2023-07-25 DIAGNOSIS — E1165 Type 2 diabetes mellitus with hyperglycemia: Secondary | ICD-10-CM | POA: Diagnosis not present

## 2023-07-25 DIAGNOSIS — I739 Peripheral vascular disease, unspecified: Secondary | ICD-10-CM | POA: Diagnosis not present

## 2023-07-25 DIAGNOSIS — E782 Mixed hyperlipidemia: Secondary | ICD-10-CM | POA: Diagnosis not present

## 2023-07-25 DIAGNOSIS — E1142 Type 2 diabetes mellitus with diabetic polyneuropathy: Secondary | ICD-10-CM | POA: Diagnosis not present

## 2023-07-25 DIAGNOSIS — M810 Age-related osteoporosis without current pathological fracture: Secondary | ICD-10-CM | POA: Diagnosis not present

## 2023-08-05 ENCOUNTER — Telehealth: Payer: Self-pay | Admitting: Pharmacist

## 2023-08-05 NOTE — Progress Notes (Signed)
   08/05/2023  Patient ID: Sarah Francis, female   DOB: [birth year], 78 y.o.   MRN: [id number]  Patient appeared on insurance report for at-risk for failing 2025 metric: Medication Adherence for Cholesterol (MAC)    Medication: Atorvastatin  80mg  Last fill date: 02/25/23  Fail date: 08/01/23  Unknown why patient has not picked up the medication in a while. Should have refills on script. PCP said to restart on 06/29/23. Note says the patient stopped all of her meds in January due to nausea. Need to see how much she has left and confirm she is taking (LDL 157 on 06/29/23).  Will fail adherence when refilled on 2025.    Aloysius Lewis, PharmD [facility]  Phone Number: [phone]

## 2023-08-25 DIAGNOSIS — E782 Mixed hyperlipidemia: Secondary | ICD-10-CM | POA: Diagnosis not present

## 2023-08-25 DIAGNOSIS — E1142 Type 2 diabetes mellitus with diabetic polyneuropathy: Secondary | ICD-10-CM | POA: Diagnosis not present

## 2023-08-25 DIAGNOSIS — I25119 Atherosclerotic heart disease of native coronary artery with unspecified angina pectoris: Secondary | ICD-10-CM | POA: Diagnosis not present

## 2023-08-25 DIAGNOSIS — M810 Age-related osteoporosis without current pathological fracture: Secondary | ICD-10-CM | POA: Diagnosis not present

## 2023-09-25 DIAGNOSIS — E1165 Type 2 diabetes mellitus with hyperglycemia: Secondary | ICD-10-CM | POA: Diagnosis not present

## 2023-09-25 DIAGNOSIS — I10 Essential (primary) hypertension: Secondary | ICD-10-CM | POA: Diagnosis not present

## 2023-09-25 DIAGNOSIS — E1142 Type 2 diabetes mellitus with diabetic polyneuropathy: Secondary | ICD-10-CM | POA: Diagnosis not present

## 2023-09-25 DIAGNOSIS — M810 Age-related osteoporosis without current pathological fracture: Secondary | ICD-10-CM | POA: Diagnosis not present

## 2023-09-25 DIAGNOSIS — E782 Mixed hyperlipidemia: Secondary | ICD-10-CM | POA: Diagnosis not present

## 2023-09-25 DIAGNOSIS — I739 Peripheral vascular disease, unspecified: Secondary | ICD-10-CM | POA: Diagnosis not present

## 2023-09-25 DIAGNOSIS — I25119 Atherosclerotic heart disease of native coronary artery with unspecified angina pectoris: Secondary | ICD-10-CM | POA: Diagnosis not present

## 2023-09-29 DIAGNOSIS — G63 Polyneuropathy in diseases classified elsewhere: Secondary | ICD-10-CM | POA: Diagnosis not present

## 2023-09-29 DIAGNOSIS — Z794 Long term (current) use of insulin: Secondary | ICD-10-CM | POA: Diagnosis not present

## 2023-09-29 DIAGNOSIS — Z23 Encounter for immunization: Secondary | ICD-10-CM | POA: Diagnosis not present

## 2023-09-29 DIAGNOSIS — E1165 Type 2 diabetes mellitus with hyperglycemia: Secondary | ICD-10-CM | POA: Diagnosis not present

## 2023-09-29 DIAGNOSIS — E782 Mixed hyperlipidemia: Secondary | ICD-10-CM | POA: Diagnosis not present

## 2023-09-29 DIAGNOSIS — Z79899 Other long term (current) drug therapy: Secondary | ICD-10-CM | POA: Diagnosis not present

## 2023-09-29 DIAGNOSIS — I2584 Coronary atherosclerosis due to calcified coronary lesion: Secondary | ICD-10-CM | POA: Diagnosis not present

## 2023-09-29 DIAGNOSIS — I10 Essential (primary) hypertension: Secondary | ICD-10-CM | POA: Diagnosis not present

## 2023-09-29 DIAGNOSIS — E559 Vitamin D deficiency, unspecified: Secondary | ICD-10-CM | POA: Diagnosis not present

## 2023-09-29 DIAGNOSIS — M810 Age-related osteoporosis without current pathological fracture: Secondary | ICD-10-CM | POA: Diagnosis not present

## 2023-09-29 DIAGNOSIS — E1142 Type 2 diabetes mellitus with diabetic polyneuropathy: Secondary | ICD-10-CM | POA: Diagnosis not present

## 2023-10-01 DIAGNOSIS — E1165 Type 2 diabetes mellitus with hyperglycemia: Secondary | ICD-10-CM | POA: Diagnosis not present

## 2023-10-01 DIAGNOSIS — I739 Peripheral vascular disease, unspecified: Secondary | ICD-10-CM | POA: Diagnosis not present

## 2023-10-01 DIAGNOSIS — E1142 Type 2 diabetes mellitus with diabetic polyneuropathy: Secondary | ICD-10-CM | POA: Diagnosis not present

## 2023-10-01 DIAGNOSIS — I10 Essential (primary) hypertension: Secondary | ICD-10-CM | POA: Diagnosis not present

## 2023-10-10 DIAGNOSIS — E1165 Type 2 diabetes mellitus with hyperglycemia: Secondary | ICD-10-CM | POA: Diagnosis not present

## 2023-10-10 DIAGNOSIS — E1142 Type 2 diabetes mellitus with diabetic polyneuropathy: Secondary | ICD-10-CM | POA: Diagnosis not present

## 2023-10-25 DIAGNOSIS — M810 Age-related osteoporosis without current pathological fracture: Secondary | ICD-10-CM | POA: Diagnosis not present

## 2023-10-25 DIAGNOSIS — E1165 Type 2 diabetes mellitus with hyperglycemia: Secondary | ICD-10-CM | POA: Diagnosis not present

## 2023-10-25 DIAGNOSIS — I739 Peripheral vascular disease, unspecified: Secondary | ICD-10-CM | POA: Diagnosis not present

## 2023-10-25 DIAGNOSIS — I25119 Atherosclerotic heart disease of native coronary artery with unspecified angina pectoris: Secondary | ICD-10-CM | POA: Diagnosis not present

## 2023-10-25 DIAGNOSIS — E1142 Type 2 diabetes mellitus with diabetic polyneuropathy: Secondary | ICD-10-CM | POA: Diagnosis not present

## 2023-10-25 DIAGNOSIS — E782 Mixed hyperlipidemia: Secondary | ICD-10-CM | POA: Diagnosis not present

## 2023-10-25 DIAGNOSIS — I10 Essential (primary) hypertension: Secondary | ICD-10-CM | POA: Diagnosis not present

## 2023-10-31 DIAGNOSIS — I739 Peripheral vascular disease, unspecified: Secondary | ICD-10-CM | POA: Diagnosis not present

## 2023-10-31 DIAGNOSIS — E1165 Type 2 diabetes mellitus with hyperglycemia: Secondary | ICD-10-CM | POA: Diagnosis not present

## 2023-10-31 DIAGNOSIS — I10 Essential (primary) hypertension: Secondary | ICD-10-CM | POA: Diagnosis not present

## 2023-10-31 DIAGNOSIS — E1142 Type 2 diabetes mellitus with diabetic polyneuropathy: Secondary | ICD-10-CM | POA: Diagnosis not present

## 2023-11-24 DIAGNOSIS — F324 Major depressive disorder, single episode, in partial remission: Secondary | ICD-10-CM | POA: Diagnosis not present

## 2023-11-24 DIAGNOSIS — I25119 Atherosclerotic heart disease of native coronary artery with unspecified angina pectoris: Secondary | ICD-10-CM | POA: Diagnosis not present

## 2023-11-24 DIAGNOSIS — I509 Heart failure, unspecified: Secondary | ICD-10-CM | POA: Diagnosis not present

## 2023-11-24 DIAGNOSIS — E1122 Type 2 diabetes mellitus with diabetic chronic kidney disease: Secondary | ICD-10-CM | POA: Diagnosis not present

## 2023-11-24 DIAGNOSIS — E1142 Type 2 diabetes mellitus with diabetic polyneuropathy: Secondary | ICD-10-CM | POA: Diagnosis not present

## 2023-11-24 DIAGNOSIS — I130 Hypertensive heart and chronic kidney disease with heart failure and stage 1 through stage 4 chronic kidney disease, or unspecified chronic kidney disease: Secondary | ICD-10-CM | POA: Diagnosis not present

## 2023-11-24 DIAGNOSIS — E11620 Type 2 diabetes mellitus with diabetic dermatitis: Secondary | ICD-10-CM | POA: Diagnosis not present

## 2023-11-24 DIAGNOSIS — I700 Atherosclerosis of aorta: Secondary | ICD-10-CM | POA: Diagnosis not present

## 2023-11-24 DIAGNOSIS — E1151 Type 2 diabetes mellitus with diabetic peripheral angiopathy without gangrene: Secondary | ICD-10-CM | POA: Diagnosis not present

## 2023-11-25 DIAGNOSIS — E1142 Type 2 diabetes mellitus with diabetic polyneuropathy: Secondary | ICD-10-CM | POA: Diagnosis not present

## 2023-11-25 DIAGNOSIS — I739 Peripheral vascular disease, unspecified: Secondary | ICD-10-CM | POA: Diagnosis not present

## 2023-11-25 DIAGNOSIS — M810 Age-related osteoporosis without current pathological fracture: Secondary | ICD-10-CM | POA: Diagnosis not present

## 2023-11-25 DIAGNOSIS — E782 Mixed hyperlipidemia: Secondary | ICD-10-CM | POA: Diagnosis not present

## 2023-11-25 DIAGNOSIS — I10 Essential (primary) hypertension: Secondary | ICD-10-CM | POA: Diagnosis not present

## 2023-11-25 DIAGNOSIS — I25119 Atherosclerotic heart disease of native coronary artery with unspecified angina pectoris: Secondary | ICD-10-CM | POA: Diagnosis not present

## 2023-11-25 DIAGNOSIS — E1165 Type 2 diabetes mellitus with hyperglycemia: Secondary | ICD-10-CM | POA: Diagnosis not present

## 2023-11-29 ENCOUNTER — Telehealth: Payer: Self-pay | Admitting: Pharmacist

## 2023-11-29 NOTE — Progress Notes (Signed)
   11/29/2023  Patient ID: Sarah Francis, female   DOB: 21-Apr-1945, 78 y.o.   MRN: [id number]  Received message from Dr. Aisha regarding hypertension. Called and spoke with the patient on the phone today.  BP meds: Amlodipine 5mg , Lisinopril  10mg , Toprol  XL 100mg - takes anywhere from 10AM to 2PM  Ran out of Trazodone at night  Has been getting systolic 160's at very random times like 9PM or 3AM.  Need to start by having a daily timeframe in which she takes her meds and checks readings 1-2 hours afterwards. Will then change the timeframe to see how it does at various times of the day.   Hypertension:  Reviewed BP's said she was doing really well for a little while and then had significant family issues that occurred over the past week that has elevated her BP. Uses a pillbox to remember her medications daily. If her BP readings come back high, she will ensure she took her meds for the day and take them if it is within the 12-hour mark. Confirms she has all 3 BP medications at this time-confirmed with fill history. Said she normally takes her meds at the same time in the morning anywhere from 10AM to 2PM, depending on when she wakes up. For recommendations, advised to set a phone alarm to remember to check it 1-2 hours after her meds every day and wait 5 minutes after relaxing (start a TV show, etc) and then check it. We want accurate readings. Advised if they do not come down, we may have to adjust or add medications.  Insomnia: Asked about Trazodone since she was late on the fill. Only takes as needed. Sleep maintenance is an ongoing issue. Takes 2-3 hour naps regularly.   DM: Using Libre 2. Having no issues getting from supplier at this time. Will let us  know if she has any difficulties and to switch to [place] 2+. Taking Humalog 18 units and Lantus  25U, both twice a day.   Osteoporosis: Asked about Prolia . Said there is an application submitted to the supplier [employer] currently- waiting on  response. Office will let her know when it's approved. Asked about osteonecrosis of the jaw with Prolia . Scared of taking it due to this happening with a friend and the dentist saying she was lucky it hadn't happened to her yet. Advised this is a 2% risk of occurrence clinically and is more common with oral osteoporosis medications. Advised if she has any concerns to let us  know of any upcoming major dental work and we can work around Prolia  timing to mitigate risk.   Follow-up on 11/13 to see how BP's are looking with adjustments in how she takes the readings.    Aloysius Lewis, PharmD, [facility] Phone Number: [phone]

## 2023-11-30 DIAGNOSIS — I739 Peripheral vascular disease, unspecified: Secondary | ICD-10-CM | POA: Diagnosis not present

## 2023-11-30 DIAGNOSIS — E1142 Type 2 diabetes mellitus with diabetic polyneuropathy: Secondary | ICD-10-CM | POA: Diagnosis not present

## 2023-11-30 DIAGNOSIS — I10 Essential (primary) hypertension: Secondary | ICD-10-CM | POA: Diagnosis not present

## 2023-11-30 DIAGNOSIS — E1165 Type 2 diabetes mellitus with hyperglycemia: Secondary | ICD-10-CM | POA: Diagnosis not present

## 2023-12-25 DIAGNOSIS — E1142 Type 2 diabetes mellitus with diabetic polyneuropathy: Secondary | ICD-10-CM | POA: Diagnosis not present

## 2023-12-25 DIAGNOSIS — E1165 Type 2 diabetes mellitus with hyperglycemia: Secondary | ICD-10-CM | POA: Diagnosis not present

## 2023-12-25 DIAGNOSIS — I25119 Atherosclerotic heart disease of native coronary artery with unspecified angina pectoris: Secondary | ICD-10-CM | POA: Diagnosis not present

## 2023-12-25 DIAGNOSIS — I739 Peripheral vascular disease, unspecified: Secondary | ICD-10-CM | POA: Diagnosis not present

## 2023-12-25 DIAGNOSIS — M810 Age-related osteoporosis without current pathological fracture: Secondary | ICD-10-CM | POA: Diagnosis not present

## 2023-12-25 DIAGNOSIS — E782 Mixed hyperlipidemia: Secondary | ICD-10-CM | POA: Diagnosis not present

## 2023-12-25 DIAGNOSIS — I10 Essential (primary) hypertension: Secondary | ICD-10-CM | POA: Diagnosis not present
